# Patient Record
Sex: Female | Born: 1960 | Race: White | Hispanic: Yes | Marital: Married | Smoking: Never smoker
Health system: Southern US, Community
[De-identification: ages and names within clinical notes are randomized; demographics above are authoritative.]

## PROBLEM LIST (undated history)

## (undated) DIAGNOSIS — T4145XA Adverse effect of unspecified anesthetic, initial encounter: Secondary | ICD-10-CM

## (undated) DIAGNOSIS — K439 Ventral hernia without obstruction or gangrene: Secondary | ICD-10-CM

## (undated) DIAGNOSIS — Z6835 Body mass index (BMI) 35.0-35.9, adult: Secondary | ICD-10-CM

## (undated) DIAGNOSIS — R51 Headache: Secondary | ICD-10-CM

## (undated) DIAGNOSIS — I10 Essential (primary) hypertension: Secondary | ICD-10-CM

## (undated) DIAGNOSIS — Z973 Presence of spectacles and contact lenses: Secondary | ICD-10-CM

## (undated) DIAGNOSIS — T8859XA Other complications of anesthesia, initial encounter: Secondary | ICD-10-CM

## (undated) DIAGNOSIS — D45 Polycythemia vera: Secondary | ICD-10-CM

## (undated) DIAGNOSIS — M2041 Other hammer toe(s) (acquired), right foot: Secondary | ICD-10-CM

## (undated) DIAGNOSIS — R519 Headache, unspecified: Secondary | ICD-10-CM

## (undated) DIAGNOSIS — K219 Gastro-esophageal reflux disease without esophagitis: Secondary | ICD-10-CM

## (undated) DIAGNOSIS — Z6836 Body mass index (BMI) 36.0-36.9, adult: Secondary | ICD-10-CM

## (undated) DIAGNOSIS — G4730 Sleep apnea, unspecified: Secondary | ICD-10-CM

## (undated) DIAGNOSIS — J309 Allergic rhinitis, unspecified: Secondary | ICD-10-CM

## (undated) DIAGNOSIS — D75839 Thrombocytosis, unspecified: Secondary | ICD-10-CM

## (undated) DIAGNOSIS — F439 Reaction to severe stress, unspecified: Secondary | ICD-10-CM

## (undated) DIAGNOSIS — M1990 Unspecified osteoarthritis, unspecified site: Secondary | ICD-10-CM

## (undated) DIAGNOSIS — B070 Plantar wart: Secondary | ICD-10-CM

## (undated) DIAGNOSIS — D649 Anemia, unspecified: Secondary | ICD-10-CM

## (undated) HISTORY — DX: Thrombocytosis, unspecified: D75.839

## (undated) HISTORY — DX: Plantar wart: B07.0

## (undated) HISTORY — PX: CHOLECYSTECTOMY: SHX55

## (undated) HISTORY — DX: Morbid (severe) obesity due to excess calories: E66.01

## (undated) HISTORY — DX: Other hammer toe(s) (acquired), right foot: M20.41

## (undated) HISTORY — DX: Body mass index (BMI) 36.0-36.9, adult: Z68.36

## (undated) HISTORY — DX: Polycythemia vera: D45

## (undated) HISTORY — PX: COLONOSCOPY: SHX174

## (undated) HISTORY — DX: Reaction to severe stress, unspecified: F43.9

## (undated) HISTORY — PX: WISDOM TOOTH EXTRACTION: SHX21

## (undated) HISTORY — DX: Allergic rhinitis, unspecified: J30.9

## (undated) HISTORY — DX: Body mass index (BMI) 35.0-35.9, adult: Z68.35

## (undated) HISTORY — PX: HERNIA REPAIR: SHX51

---

## 2017-07-17 ENCOUNTER — Other Ambulatory Visit: Payer: Self-pay | Admitting: Surgery

## 2017-07-17 DIAGNOSIS — K439 Ventral hernia without obstruction or gangrene: Secondary | ICD-10-CM

## 2017-07-20 ENCOUNTER — Other Ambulatory Visit: Payer: Self-pay | Admitting: Obstetrics & Gynecology

## 2017-07-20 DIAGNOSIS — Z139 Encounter for screening, unspecified: Secondary | ICD-10-CM

## 2017-07-26 ENCOUNTER — Ambulatory Visit: Payer: Self-pay

## 2017-08-14 ENCOUNTER — Ambulatory Visit
Admission: RE | Admit: 2017-08-14 | Discharge: 2017-08-14 | Disposition: A | Discharge status: Home / Self Care | Payer: 59 | Source: Ambulatory Visit | Attending: Obstetrics & Gynecology | Admitting: Obstetrics & Gynecology

## 2017-08-14 DIAGNOSIS — Z139 Encounter for screening, unspecified: Secondary | ICD-10-CM

## 2017-08-23 ENCOUNTER — Other Ambulatory Visit: Payer: Self-pay

## 2017-11-03 ENCOUNTER — Ambulatory Visit: Payer: Self-pay | Admitting: Surgery

## 2017-11-03 NOTE — H&P (Signed)
History of Present Illness Connie Myers. Connie Shaker MD; 11/03/2017 4:37 PM) The patient is a 57 year old female who presents with an abdominal wall hernia.   PCP - Connie Myers GYN - Connie Myers  This is a 57 year old female who has had a protruding mass to the right of her umbilicus for at least 18 years. She has had 2 previous C-sections as well as a previous laparoscopic cholecystectomy. Over the last few years she has noticed worsening pain in her right abdominal wall mass. She has had several episodes that are quite severe and have resulted in nausea and vomiting.The mass remains reducible. She has not had any recent imaging. She has lost some weight over the last year. She presents now to discuss surgical options for treatment.   Problem List/Past Medical Connie Myers K. Rivaan Kendall, MD; 11/03/2017 4:37 PM) VENTRAL HERNIA WITHOUT OBSTRUCTION OR GANGRENE (K43.9)  Past Surgical History (Connie Mills K. Yader Criger, MD; 11/03/2017 4:37 PM) Cesarean Section - Multiple Gallbladder Surgery - Laparoscopic Oral Surgery  Diagnostic Studies History Connie Myers. Connie Schoenherr, MD; 11/03/2017 4:37 PM) Colonoscopy 1-5 years ago Pap Smear 1-5 years ago  Allergies Connie Myers, CMA; 11/03/2017 11:07 AM) Codeine Phosphate *ANALGESICS - OPIOID Lidocaine HCl *CHEMICALS* allergic to all meds ending in caine. Allergies Reconciled  Medication History Connie Myers. Connie Mcnulty, MD; 11/03/2017 4:37 PM) Amoxicillin (Oral) Specific strength unknown - Active. Mucinex (Oral) Specific strength unknown - Active. Benzonatate (Oral) Specific strength unknown - Active. Allegra Allergy (Oral) Specific strength unknown - Active. Medications Reconciled No Current Medications  Social History Connie Myers. Connie Warsame, MD; 11/03/2017 4:37 PM) Alcohol use Occasional alcohol use. Caffeine use Coffee, Tea. No drug use Tobacco use Never smoker.  Family History Connie Myers. Connie Jaffee, MD; 11/03/2017 4:37 PM) Alcohol Abuse Brother, Mother. Arthritis  Mother. Breast Cancer Mother. Heart Disease Mother. Migraine Headache Mother. Thyroid problems Mother.  Pregnancy / Birth History Connie Myers. Connie Heydt, MD; 11/03/2017 4:37 PM) Age at menarche 63 years. Age of menopause <43 Gravida 3 Irregular periods Length (months) of breastfeeding 7-12 Maternal age 70-35 Para 2  Other Problems Connie Myers. Connie Singleterry, MD; 11/03/2017 4:37 PM) Gastric Ulcer High blood pressure Migraine Headache Sleep Apnea    Vitals (Connie Myers CMA; 11/03/2017 11:08 AM) 11/03/2017 11:08 AM Weight: 236 lb Height: 61in Body Surface Area: 2.03 m Body Mass Index: 44.59 kg/mA Temp.: 98.70F(Oral)  Pulse: 89 (Regular)  BP: 142/98 (Sitting, Left Arm, Standard)      Physical Exam Connie Myers K. Yosef Krogh MD; 11/03/2017 4:39 PM)  The physical exam findings are as follows: Note:WDWN in NAD Eyes: Pupils equal, round; sclera anicteric HENT: Oral mucosa moist; good dentition Neck: No masses palpated, no thyromegaly Lungs: CTA bilaterally; normal respiratory effort CV: Regular rate and rhythm; no murmurs; extremities well-perfused with no edema Abd: +bowel sounds, soft, non-tender, no palpable organomegaly; 5 cm protruding mass just to the right of umbilicus - reducible; enlarges with Valsalva. No other hernias palpated Skin: Warm, dry; no sign of jaundice Psychiatric - alert and oriented x 4; calm mood and affect    Assessment & Plan Connie Myers K. Anara Cowman MD; 11/03/2017 4:39 PM)  VENTRAL HERNIA WITHOUT OBSTRUCTION OR GANGRENE (K43.9)  Current Plans Schedule for Surgery - open ventral hernia repair with mesh. The surgical procedure has been discussed with the patient.  Potential risks, benefits, alternative treatments, and expected outcomes have been explained. All of the patient's questions at this time have been answered. The likelihood of reaching the patient's treatment goal is good. The patient understand the proposed surgical procedure and  wishes to proceed.  Connie Myers. Georgette Dover, MD, [facility]  General/ Trauma Surgery  11/03/2017 4:43 PM

## 2017-11-30 NOTE — Pre-Procedure Instructions (Signed)
Connie Myers  11/30/2017      [facility], [place] [address] [place] [place] [address] Phone: [phone] Fax: [phone]    Your procedure is scheduled on 12/05/17.  Report to [facility] Admitting at 11 A.M.  Call this number if you have problems the morning of surgery:  [phone]   Remember:  Do not eat or drink after midnight.   Take these medicines the morning of surgery with A SIP OF WATER --allegra    Do not wear jewelry, make-up or nail polish.  Do not wear lotions, powders, or perfumes, or deodorant.  Do not shave 48 hours prior to surgery.  Men may shave face and neck.  Do not bring valuables to the hospital.  [employer] is not responsible for any belongings or valuables.  Contacts, dentures or bridgework may not be worn into surgery.  Leave your suitcase in the car.  After surgery it may be brought to your room.  For patients admitted to the hospital, discharge time will be determined by your treatment team.  Patients discharged the day of surgery will not be allowed to drive home.   Name and phone number of your driver:    Special instructions:  Do not take any aspirin,anti-inflammatories,vitamins,or herbal supplements 5-7 days prior to surgery.Please complete your PRE-SURGERY ENSURE that was given to before you leave your house the morning of surgery.  Please, if able, drink it in one setting. DO NOT SIP.[place] - Preparing for Surgery  Before surgery, you can play an important role.  Because skin is not sterile, your skin needs to be as free of germs as possible.  You can reduce the number of germs on you skin by washing with CHG (chlorahexidine gluconate) soap before surgery.  CHG is an antiseptic cleaner which kills germs and bonds with the skin to continue killing germs even after washing.  Oral Hygiene is also important in reducing the risk of infection.  Remember to brush your  teeth with your regular toothpaste the morning of surgery.  Please DO NOT use if you have an allergy to CHG or antibacterial soaps.  If your skin becomes reddened/irritated stop using the CHG and inform your nurse when you arrive at Short Stay.  Do not shave (including legs and underarms) for at least 48 hours prior to the first CHG shower.  You may shave your face.  Please follow these instructions carefully:   1.  Shower with CHG Soap the night before surgery and the morning of Surgery.  2.  If you choose to wash your hair, wash your hair first as usual with your normal shampoo.  3.  After you shampoo, rinse your hair and body thoroughly to remove the shampoo. 4.  Use CHG as you would any other liquid soap.  You can apply chg directly to the skin and wash gently with a      scrungie or washcloth.           5.  Apply the CHG Soap to your body ONLY FROM THE NECK DOWN.   Do not use on open wounds or open sores. Avoid contact with your eyes, ears, mouth and genitals (private parts).  Wash genitals (private parts) with your normal soap.  6.  Wash thoroughly, paying special attention to the area where your surgery will be performed.  7.  Thoroughly rinse your body with warm water from the neck down.  8.  DO NOT shower/wash with your normal soap after using and rinsing off the CHG Soap.  9.  Pat yourself dry with a clean towel.            10.  Wear clean pajamas.            11.  Place clean sheets on your bed the night of your first shower and do not sleep with pets.  Day of Surgery  Do not apply any lotions/deoderants the morning of surgery.   Please wear clean clothes to the hospital/surgery center. Remember to brush your teeth with toothpaste.    Please read over the following fact sheets that you were given.

## 2017-12-01 ENCOUNTER — Encounter (HOSPITAL_COMMUNITY): Payer: Self-pay

## 2017-12-01 ENCOUNTER — Other Ambulatory Visit: Payer: Self-pay

## 2017-12-01 ENCOUNTER — Encounter (HOSPITAL_COMMUNITY)
Admission: RE | Admit: 2017-12-01 | Discharge: 2017-12-01 | Disposition: A | Discharge status: Home / Self Care | Payer: 59 | Source: Ambulatory Visit | Attending: Surgery | Admitting: Surgery

## 2017-12-01 DIAGNOSIS — Z01812 Encounter for preprocedural laboratory examination: Secondary | ICD-10-CM | POA: Diagnosis present

## 2017-12-01 HISTORY — DX: Ventral hernia without obstruction or gangrene: K43.9

## 2017-12-01 LAB — CBC
HCT: 52.4 % — ABNORMAL HIGH (ref 36.0–46.0)
Hemoglobin: 16.2 g/dL — ABNORMAL HIGH (ref 12.0–15.0)
MCH: 22.2 pg — ABNORMAL LOW (ref 26.0–34.0)
MCHC: 30.9 g/dL (ref 30.0–36.0)
MCV: 71.9 fL — ABNORMAL LOW (ref 78.0–100.0)
Platelets: 713 10*3/uL — ABNORMAL HIGH (ref 150–400)
RBC: 7.29 MIL/uL — ABNORMAL HIGH (ref 3.87–5.11)
RDW: 18.5 % — ABNORMAL HIGH (ref 11.5–15.5)
WBC: 8 10*3/uL (ref 4.0–10.5)

## 2017-12-01 NOTE — Progress Notes (Addendum)
PCP: Dr. Genelle Bal, MD  Cardiologist: pt denies  EKG: pt denies  Stress test: pt denies  ECHO: pt denies  Cardiac Cath: pt denies  Chest x-ray: pt denies past year, no recent respiratory infections/complications

## 2017-12-05 ENCOUNTER — Ambulatory Visit (HOSPITAL_COMMUNITY): Payer: 59 | Admitting: Certified Registered"

## 2017-12-05 ENCOUNTER — Encounter (HOSPITAL_COMMUNITY)
Admission: RE | Disposition: A | Discharge status: Home / Self Care | Payer: Self-pay | Source: Ambulatory Visit | Attending: Surgery

## 2017-12-05 ENCOUNTER — Encounter (HOSPITAL_COMMUNITY): Payer: Self-pay | Admitting: Urology

## 2017-12-05 ENCOUNTER — Observation Stay (HOSPITAL_COMMUNITY)
Admission: RE | Admit: 2017-12-05 | Discharge: 2017-12-07 | Disposition: A | Discharge status: Home / Self Care | Payer: 59 | Source: Ambulatory Visit | Attending: Surgery | Admitting: Surgery

## 2017-12-05 ENCOUNTER — Other Ambulatory Visit: Payer: Self-pay

## 2017-12-05 DIAGNOSIS — Z79899 Other long term (current) drug therapy: Secondary | ICD-10-CM | POA: Insufficient documentation

## 2017-12-05 DIAGNOSIS — Z8261 Family history of arthritis: Secondary | ICD-10-CM | POA: Insufficient documentation

## 2017-12-05 DIAGNOSIS — Z811 Family history of alcohol abuse and dependence: Secondary | ICD-10-CM | POA: Insufficient documentation

## 2017-12-05 DIAGNOSIS — Z885 Allergy status to narcotic agent status: Secondary | ICD-10-CM | POA: Insufficient documentation

## 2017-12-05 DIAGNOSIS — Z9049 Acquired absence of other specified parts of digestive tract: Secondary | ICD-10-CM | POA: Insufficient documentation

## 2017-12-05 DIAGNOSIS — Z803 Family history of malignant neoplasm of breast: Secondary | ICD-10-CM | POA: Insufficient documentation

## 2017-12-05 DIAGNOSIS — Z884 Allergy status to anesthetic agent status: Secondary | ICD-10-CM | POA: Insufficient documentation

## 2017-12-05 DIAGNOSIS — Z9889 Other specified postprocedural states: Secondary | ICD-10-CM

## 2017-12-05 DIAGNOSIS — Z8249 Family history of ischemic heart disease and other diseases of the circulatory system: Secondary | ICD-10-CM | POA: Insufficient documentation

## 2017-12-05 DIAGNOSIS — K439 Ventral hernia without obstruction or gangrene: Secondary | ICD-10-CM | POA: Diagnosis not present

## 2017-12-05 DIAGNOSIS — Z820 Family history of epilepsy and other diseases of the nervous system: Secondary | ICD-10-CM | POA: Insufficient documentation

## 2017-12-05 DIAGNOSIS — E669 Obesity, unspecified: Secondary | ICD-10-CM | POA: Diagnosis not present

## 2017-12-05 DIAGNOSIS — Z7982 Long term (current) use of aspirin: Secondary | ICD-10-CM | POA: Insufficient documentation

## 2017-12-05 DIAGNOSIS — Z8349 Family history of other endocrine, nutritional and metabolic diseases: Secondary | ICD-10-CM | POA: Diagnosis not present

## 2017-12-05 DIAGNOSIS — Z6834 Body mass index (BMI) 34.0-34.9, adult: Secondary | ICD-10-CM | POA: Insufficient documentation

## 2017-12-05 DIAGNOSIS — Z8719 Personal history of other diseases of the digestive system: Secondary | ICD-10-CM

## 2017-12-05 HISTORY — PX: VENTRAL HERNIA REPAIR: SHX424

## 2017-12-05 HISTORY — PX: INSERTION OF MESH: SHX5868

## 2017-12-05 SURGERY — REPAIR, HERNIA, VENTRAL
Anesthesia: General | Site: Abdomen

## 2017-12-05 MED ORDER — DEXAMETHASONE SODIUM PHOSPHATE 10 MG/ML IJ SOLN
INTRAMUSCULAR | Status: DC | PRN
  Administered 2017-12-05: 10 mg via INTRAVENOUS

## 2017-12-05 MED ORDER — BUPIVACAINE-EPINEPHRINE 0.25% -1:200000 IJ SOLN
INTRAMUSCULAR | Status: DC | PRN
  Administered 2017-12-05: 10 mL

## 2017-12-05 MED ORDER — PROPOFOL 10 MG/ML IV BOLUS
INTRAVENOUS | Status: DC | PRN
  Administered 2017-12-05: 140 mg via INTRAVENOUS

## 2017-12-05 MED ORDER — ACETAMINOPHEN 500 MG PO TABS
1000.0000 mg | ORAL_TABLET | ORAL | Status: AC
  Administered 2017-12-05: 1000 mg via ORAL

## 2017-12-05 MED ORDER — CELECOXIB 200 MG PO CAPS
200.0000 mg | ORAL_CAPSULE | ORAL | Status: AC
  Administered 2017-12-05: 200 mg via ORAL

## 2017-12-05 MED ORDER — CELECOXIB 200 MG PO CAPS
ORAL_CAPSULE | ORAL | Status: AC
  Administered 2017-12-05: 200 mg via ORAL
  Filled 2017-12-05: qty 1

## 2017-12-05 MED ORDER — FENTANYL CITRATE (PF) 100 MCG/2ML IJ SOLN
INTRAMUSCULAR | Status: DC | PRN
  Administered 2017-12-05 (×4): 50 ug via INTRAVENOUS

## 2017-12-05 MED ORDER — 0.9 % SODIUM CHLORIDE (POUR BTL) OPTIME
TOPICAL | Status: DC | PRN
  Administered 2017-12-05: 1000 mL

## 2017-12-05 MED ORDER — GABAPENTIN 300 MG PO CAPS
ORAL_CAPSULE | ORAL | Status: AC
  Administered 2017-12-05: 300 mg via ORAL
  Filled 2017-12-05: qty 1

## 2017-12-05 MED ORDER — SUGAMMADEX SODIUM 200 MG/2ML IV SOLN
INTRAVENOUS | Status: AC
  Filled 2017-12-05: qty 2

## 2017-12-05 MED ORDER — KETOROLAC TROMETHAMINE 30 MG/ML IJ SOLN
30.0000 mg | Freq: Once | INTRAMUSCULAR | Status: AC | PRN
  Administered 2017-12-05: 30 mg via INTRAVENOUS

## 2017-12-05 MED ORDER — HYDROMORPHONE HCL 2 MG/ML IJ SOLN
INTRAMUSCULAR | Status: AC
  Administered 2017-12-05: 0.5 mg via INTRAVENOUS
  Filled 2017-12-05: qty 1

## 2017-12-05 MED ORDER — ROCURONIUM BROMIDE 100 MG/10ML IV SOLN
INTRAVENOUS | Status: DC | PRN
  Administered 2017-12-05: 10 mg via INTRAVENOUS
  Administered 2017-12-05: 50 mg via INTRAVENOUS

## 2017-12-05 MED ORDER — ROCURONIUM BROMIDE 50 MG/5ML IV SOLN
INTRAVENOUS | Status: AC
  Filled 2017-12-05: qty 1

## 2017-12-05 MED ORDER — TRAMADOL HCL 50 MG PO TABS
50.0000 mg | ORAL_TABLET | Freq: Four times a day (QID) | ORAL | Status: DC | PRN
  Administered 2017-12-05: 50 mg via ORAL
  Filled 2017-12-05: qty 1

## 2017-12-05 MED ORDER — MIDAZOLAM HCL 2 MG/2ML IJ SOLN
2.0000 mg | Freq: Once | INTRAMUSCULAR | Status: AC
  Administered 2017-12-05: 2 mg via INTRAVENOUS

## 2017-12-05 MED ORDER — LIDOCAINE 2% (20 MG/ML) 5 ML SYRINGE
INTRAMUSCULAR | Status: AC
  Filled 2017-12-05: qty 5

## 2017-12-05 MED ORDER — ACETAMINOPHEN 500 MG PO TABS
ORAL_TABLET | ORAL | Status: AC
  Administered 2017-12-05: 1000 mg via ORAL
  Filled 2017-12-05: qty 2

## 2017-12-05 MED ORDER — KETOROLAC TROMETHAMINE 30 MG/ML IJ SOLN
30.0000 mg | Freq: Four times a day (QID) | INTRAMUSCULAR | Status: DC
  Administered 2017-12-05 – 2017-12-07 (×7): 30 mg via INTRAVENOUS
  Filled 2017-12-05 (×7): qty 1

## 2017-12-05 MED ORDER — ACETAMINOPHEN 325 MG PO TABS: 650.0000 mg | ORAL_TABLET | Freq: Four times a day (QID) | ORAL | Status: DC | PRN

## 2017-12-05 MED ORDER — PHENYLEPHRINE HCL 10 MG/ML IJ SOLN
INTRAMUSCULAR | Status: DC | PRN
  Administered 2017-12-05: 80 ug via INTRAVENOUS

## 2017-12-05 MED ORDER — GABAPENTIN 300 MG PO CAPS
300.0000 mg | ORAL_CAPSULE | ORAL | Status: AC
  Administered 2017-12-05: 300 mg via ORAL

## 2017-12-05 MED ORDER — GLYCOPYRROLATE PF 0.2 MG/ML IJ SOSY
PREFILLED_SYRINGE | INTRAMUSCULAR | Status: AC
  Filled 2017-12-05: qty 1

## 2017-12-05 MED ORDER — MIDAZOLAM HCL 2 MG/2ML IJ SOLN
INTRAMUSCULAR | Status: AC
  Administered 2017-12-05: 2 mg via INTRAVENOUS
  Filled 2017-12-05: qty 2

## 2017-12-05 MED ORDER — KETOROLAC TROMETHAMINE 30 MG/ML IJ SOLN
INTRAMUSCULAR | Status: AC
  Administered 2017-12-05: 30 mg via INTRAVENOUS
  Filled 2017-12-05: qty 1

## 2017-12-05 MED ORDER — SODIUM CHLORIDE 0.9 % IV SOLN
INTRAVENOUS | Status: DC
  Administered 2017-12-05 – 2017-12-06 (×2): via INTRAVENOUS

## 2017-12-05 MED ORDER — SUGAMMADEX SODIUM 200 MG/2ML IV SOLN
INTRAVENOUS | Status: DC | PRN
  Administered 2017-12-05: 200 mg via INTRAVENOUS

## 2017-12-05 MED ORDER — PROMETHAZINE HCL 25 MG/ML IJ SOLN: 6.2500 mg | INTRAMUSCULAR | Status: DC | PRN

## 2017-12-05 MED ORDER — ONDANSETRON 4 MG PO TBDP
4.0000 mg | ORAL_TABLET | Freq: Four times a day (QID) | ORAL | Status: DC | PRN
Start: 2017-12-05 — End: 2017-12-07

## 2017-12-05 MED ORDER — CHLORHEXIDINE GLUCONATE CLOTH 2 % EX PADS: 6.0000 | MEDICATED_PAD | Freq: Once | CUTANEOUS | Status: DC

## 2017-12-05 MED ORDER — ENOXAPARIN SODIUM 40 MG/0.4ML ~~LOC~~ SOLN
40.0000 mg | SUBCUTANEOUS | Status: DC
  Administered 2017-12-06: 40 mg via SUBCUTANEOUS
  Filled 2017-12-05: qty 0.4

## 2017-12-05 MED ORDER — LIDOCAINE HCL (CARDIAC) PF 100 MG/5ML IV SOSY
PREFILLED_SYRINGE | INTRAVENOUS | Status: DC | PRN
  Administered 2017-12-05: 60 mg via INTRAVENOUS

## 2017-12-05 MED ORDER — PHENYLEPHRINE 40 MCG/ML (10ML) SYRINGE FOR IV PUSH (FOR BLOOD PRESSURE SUPPORT)
PREFILLED_SYRINGE | INTRAVENOUS | Status: AC
  Filled 2017-12-05: qty 10

## 2017-12-05 MED ORDER — LACTATED RINGERS IV SOLN
INTRAVENOUS | Status: DC
  Administered 2017-12-05 (×2): via INTRAVENOUS

## 2017-12-05 MED ORDER — ONDANSETRON HCL 4 MG/2ML IJ SOLN: 4.0000 mg | Freq: Four times a day (QID) | INTRAMUSCULAR | Status: DC | PRN

## 2017-12-05 MED ORDER — OXYCODONE HCL 5 MG PO TABS
5.0000 mg | ORAL_TABLET | ORAL | Status: DC | PRN
  Administered 2017-12-05: 10 mg via ORAL
  Administered 2017-12-06: 5 mg via ORAL
  Administered 2017-12-06 – 2017-12-07 (×2): 10 mg via ORAL
  Filled 2017-12-05 (×3): qty 2
  Filled 2017-12-05: qty 1

## 2017-12-05 MED ORDER — MIDAZOLAM HCL 5 MG/5ML IJ SOLN
INTRAMUSCULAR | Status: DC | PRN
  Administered 2017-12-05: 2 mg via INTRAVENOUS

## 2017-12-05 MED ORDER — DIPHENHYDRAMINE HCL 50 MG/ML IJ SOLN: 25.0000 mg | Freq: Four times a day (QID) | INTRAMUSCULAR | Status: DC | PRN

## 2017-12-05 MED ORDER — ROPIVACAINE HCL 5 MG/ML IJ SOLN
INTRAMUSCULAR | Status: DC | PRN
  Administered 2017-12-05: 30 mL

## 2017-12-05 MED ORDER — CEFAZOLIN SODIUM-DEXTROSE 2-4 GM/100ML-% IV SOLN
2.0000 g | INTRAVENOUS | Status: AC
  Administered 2017-12-05: 2 g via INTRAVENOUS

## 2017-12-05 MED ORDER — PROPOFOL 10 MG/ML IV BOLUS
INTRAVENOUS | Status: AC
  Filled 2017-12-05: qty 20

## 2017-12-05 MED ORDER — CEFAZOLIN SODIUM-DEXTROSE 2-4 GM/100ML-% IV SOLN
INTRAVENOUS | Status: AC
  Filled 2017-12-05: qty 100

## 2017-12-05 MED ORDER — ACETAMINOPHEN 650 MG RE SUPP: 650.0000 mg | Freq: Four times a day (QID) | RECTAL | Status: DC | PRN

## 2017-12-05 MED ORDER — FENTANYL CITRATE (PF) 100 MCG/2ML IJ SOLN
INTRAMUSCULAR | Status: AC
  Administered 2017-12-05: 50 ug via INTRAVENOUS
  Filled 2017-12-05: qty 2

## 2017-12-05 MED ORDER — ONDANSETRON HCL 4 MG/2ML IJ SOLN
INTRAMUSCULAR | Status: AC
  Filled 2017-12-05: qty 2

## 2017-12-05 MED ORDER — FENTANYL CITRATE (PF) 100 MCG/2ML IJ SOLN
50.0000 ug | Freq: Once | INTRAMUSCULAR | Status: AC
  Administered 2017-12-05: 50 ug via INTRAVENOUS

## 2017-12-05 MED ORDER — BUPIVACAINE-EPINEPHRINE (PF) 0.25% -1:200000 IJ SOLN
INTRAMUSCULAR | Status: AC
  Filled 2017-12-05: qty 30

## 2017-12-05 MED ORDER — DIPHENHYDRAMINE HCL 25 MG PO CAPS
25.0000 mg | ORAL_CAPSULE | Freq: Four times a day (QID) | ORAL | Status: DC | PRN
  Administered 2017-12-06: 25 mg via ORAL
  Filled 2017-12-05: qty 1

## 2017-12-05 MED ORDER — MIDAZOLAM HCL 2 MG/2ML IJ SOLN
INTRAMUSCULAR | Status: AC
Start: 2017-12-05 — End: ?
  Filled 2017-12-05: qty 2

## 2017-12-05 MED ORDER — FENTANYL CITRATE (PF) 250 MCG/5ML IJ SOLN
INTRAMUSCULAR | Status: AC
  Filled 2017-12-05: qty 5

## 2017-12-05 MED ORDER — MORPHINE SULFATE (PF) 2 MG/ML IV SOLN: 2.0000 mg | INTRAVENOUS | Status: DC | PRN

## 2017-12-05 MED ORDER — HYDROMORPHONE HCL 2 MG/ML IJ SOLN
0.2500 mg | INTRAMUSCULAR | Status: DC | PRN
  Administered 2017-12-05 (×3): 0.5 mg via INTRAVENOUS

## 2017-12-05 MED ORDER — ONDANSETRON HCL 4 MG/2ML IJ SOLN
INTRAMUSCULAR | Status: DC | PRN
  Administered 2017-12-05: 4 mg via INTRAVENOUS

## 2017-12-05 MED ORDER — GLYCOPYRROLATE 0.2 MG/ML IJ SOLN
INTRAMUSCULAR | Status: DC | PRN
  Administered 2017-12-05 (×2): 0.1 mg via INTRAVENOUS

## 2017-12-05 SURGICAL SUPPLY — 34 items
BENZOIN TINCTURE PRP APPL 2/3 (GAUZE/BANDAGES/DRESSINGS) ×3 IMPLANT
CANISTER SUCT 3000ML PPV (MISCELLANEOUS) ×3 IMPLANT
CHLORAPREP W/TINT 26ML (MISCELLANEOUS) ×3 IMPLANT
COVER SURGICAL LIGHT HANDLE (MISCELLANEOUS) ×3 IMPLANT
DRAPE LAPAROSCOPIC ABDOMINAL (DRAPES) ×3 IMPLANT
DRSG TEGADERM 4X4.75 (GAUZE/BANDAGES/DRESSINGS) ×3 IMPLANT
ELECT BLADE 4.0 EZ CLEAN MEGAD (MISCELLANEOUS) ×3
ELECT CAUTERY BLADE 6.4 (BLADE) ×3 IMPLANT
ELECT REM PT RETURN 9FT ADLT (ELECTROSURGICAL) ×3
ELECTRODE BLDE 4.0 EZ CLN MEGD (MISCELLANEOUS) ×2 IMPLANT
ELECTRODE REM PT RTRN 9FT ADLT (ELECTROSURGICAL) ×2 IMPLANT
GAUZE SPONGE 4X4 12PLY STRL (GAUZE/BANDAGES/DRESSINGS) ×3 IMPLANT
GAUZE SPONGE 4X4 12PLY STRL LF (GAUZE/BANDAGES/DRESSINGS) ×3 IMPLANT
GLOVE BIO SURGEON STRL SZ7 (GLOVE) ×6 IMPLANT
GLOVE BIOGEL PI IND STRL 7.5 (GLOVE) ×2 IMPLANT
GLOVE BIOGEL PI INDICATOR 7.5 (GLOVE) ×1
GOWN STRL REUS W/ TWL LRG LVL3 (GOWN DISPOSABLE) ×4 IMPLANT
GOWN STRL REUS W/TWL LRG LVL3 (GOWN DISPOSABLE) ×2
KIT BASIN OR (CUSTOM PROCEDURE TRAY) ×3 IMPLANT
KIT TURNOVER KIT B (KITS) ×3 IMPLANT
MESH VENT ST 11.4CM L CIR (Mesh General) ×3 IMPLANT
NEEDLE HYPO 25GX1X1/2 BEV (NEEDLE) ×3 IMPLANT
NS IRRIG 1000ML POUR BTL (IV SOLUTION) ×3 IMPLANT
PACK GENERAL/GYN (CUSTOM PROCEDURE TRAY) ×3 IMPLANT
PAD ARMBOARD 7.5X6 YLW CONV (MISCELLANEOUS) ×6 IMPLANT
STRIP CLOSURE SKIN 1/2X4 (GAUZE/BANDAGES/DRESSINGS) ×3 IMPLANT
SUT MNCRL AB 4-0 PS2 18 (SUTURE) ×3 IMPLANT
SUT NOVA NAB GS-21 0 18 T12 DT (SUTURE) ×9 IMPLANT
SUT NOVA NAB GS-21 1 T12 (SUTURE) ×9 IMPLANT
SUT VIC AB 3-0 SH 27 (SUTURE) ×1
SUT VIC AB 3-0 SH 27XBRD (SUTURE) ×2 IMPLANT
SYR CONTROL 10ML LL (SYRINGE) ×3 IMPLANT
TOWEL OR 17X24 6PK STRL BLUE (TOWEL DISPOSABLE) ×3 IMPLANT
TOWEL OR 17X26 10 PK STRL BLUE (TOWEL DISPOSABLE) ×3 IMPLANT

## 2017-12-05 NOTE — Op Note (Signed)
Ventral Hernia Repair Procedure Note  Indications: This is a 57 year old female who has had a protruding mass to the right of her umbilicus for at least 18 years. She has had 2 previous C-sections as well as a previous laparoscopic cholecystectomy. Over the last few years she has noticed worsening pain in her right abdominal wall mass. She has had several episodes that are quite severe and have resulted in nausea and vomiting.The mass remains reducible. She has not had any recent imaging. She has lost some weight over the last year. She presents now to discuss surgical options for treatment.  Pre-operative Diagnosis: Ventral hernia  Post-operative Diagnosis: Ventral hernia  Surgeon: Maia Petties   Assistants: Arrie Aran RNFA  Anesthesia: General endotracheal anesthesia  ASA Class: 2  Procedure Details  The patient was seen in the Holding Room. The risks, benefits, complications, treatment options, and expected outcomes were discussed with the patient. The possibilities of reaction to medication, pulmonary aspiration, perforation of viscus, bleeding, recurrent infection, the need for additional procedures, failure to diagnose a condition, and creating a complication requiring transfusion or operation were discussed with the patient. The patient concurred with the proposed plan, giving informed consent.  The site of surgery properly noted/marked. The patient was taken to the operating room, identified as Connie Myers and the procedure verified as ventral hernia repair. A Time Out was held and the above information confirmed.  The patient was placed supine.  After establishing general anesthesia, the abdomen was prepped with Chloraprep and draped in sterile fashion.  We made a vertical incision in the midline around the umbilicus.  The hernia sac seems to protrude to the right side.   Dissection was carried down to the hernia sac located above the fascia and mobilized from  surrounding structures.  Intact fascia was identified circumferentially around the defect.  I excised the large hernia sac and reduced the incarcerated omentum.  The defect measured 3 x 5 cm.  We used a 11.4 cm Ventrio mesh and secured this to the fascia with interrupted 0 Novofil sutures.  The fascial defect was reapproximated with interrupted figure-of-8 1 Novofil sutures.  The subcutaneous tissues were irrigated.  Hemostasis was good.  We closed with a deep layer of 3-0 Vicryl and a subcuticular layer of 4-0 Monocryl.   Steri-Strips were applied at the end of the operation.    Instrument, sponge, and needle counts were correct prior to closure and at the conclusion of the case.   Findings: 3 x 5 cm midline defect with incarcerated omentum  Estimated Blood Loss:  less than 50 mL         Drains: none                      Complications:  None; patient tolerated the procedure well.         Disposition: PACU - hemodynamically stable.         Condition: stable  [name]. Georgette Dover, MD, [facility]  General/ Trauma Surgery  12/05/2017 2:48 PM

## 2017-12-05 NOTE — Anesthesia Procedure Notes (Signed)
Anesthesia Procedure Image    

## 2017-12-05 NOTE — Transfer of Care (Signed)
Immediate Anesthesia Transfer of Care Note  Patient: Connie Myers  Procedure(s) Performed: OPEN VENTRAL HERNIA REPAIR ERAS PATHWAY (N/A Abdomen) INSERTION OF MESH (N/A )  Patient Location: PACU  Anesthesia Type:GA combined with regional for post-op pain  Level of Consciousness: awake  Airway & Oxygen Therapy: Patient Spontanous Breathing and Patient connected to face mask oxygen  Post-op Assessment: Report given to RN, Post -op Vital signs reviewed and stable and Patient moving all extremities X 4  Post vital signs: Reviewed and stable  Last Vitals:  Vitals Value Taken Time  BP 138/81 12/05/2017  2:54 PM  Temp    Pulse 62 12/05/2017  2:58 PM  Resp 16 12/05/2017  2:58 PM  SpO2 100 % 12/05/2017  2:58 PM  Vitals shown include unvalidated device data.  Last Pain:  Vitals:   12/05/17 1146  TempSrc:   PainSc: 6          Complications: No apparent anesthesia complications

## 2017-12-05 NOTE — H&P (Signed)
History of Present Illness  The patient is a 57 year old female who presents with an abdominal wall hernia.   PCP - Sheryn Bison GYN - Nelda Marseille  This is a 57 year old female who has had a protruding mass to the right of her umbilicus for at least 18 years. She has had 2 previous C-sections as well as a previous laparoscopic cholecystectomy. Over the last few years she has noticed worsening pain in her right abdominal wall mass. She has had several episodes that are quite severe and have resulted in nausea and vomiting.The mass remains reducible. She has not had any recent imaging. She has lost some weight over the last year. She presents now to discuss surgical options for treatment.   Problem List/Past Medical  VENTRAL HERNIA WITHOUT OBSTRUCTION OR GANGRENE (K43.9)  Past Surgical History  Cesarean Section - Multiple Gallbladder Surgery - Laparoscopic Oral Surgery  Diagnostic Studies History  Colonoscopy 1-5 years ago Pap Smear 1-5 years ago  Allergies  Codeine Phosphate *ANALGESICS - OPIOID Lidocaine HCl *CHEMICALS* allergic to all meds ending in caine. Allergies Reconciled  Medication History  Amoxicillin (Oral) Specific strength unknown - Active. Mucinex (Oral) Specific strength unknown - Active. Benzonatate (Oral) Specific strength unknown - Active. Allegra Allergy (Oral) Specific strength unknown - Active. Medications Reconciled No Current Medications  Social History  Alcohol use Occasional alcohol use. Caffeine use Coffee, Tea. No drug use Tobacco use Never smoker.  Family History  Alcohol Abuse Brother, Mother. Arthritis Mother. Breast Cancer Mother. Heart Disease Mother. Migraine Headache Mother. Thyroid problems Mother.  Pregnancy / Birth History  Age at menarche 35 years. Age of menopause <43 Gravida 3 Irregular periods Length (months) of breastfeeding 7-12 Maternal age 83-35 Para 2  Other Problems  Gastric  Ulcer High blood pressure Migraine Headache Sleep Apnea    Vitals  Weight: 236 lb Height: 61in Body Surface Area: 2.03 m Body Mass Index: 44.59 kg/mA Temp.: 98.65F(Oral)  Pulse: 89 (Regular)  BP: 142/98 (Sitting, Left Arm, Standard)      Physical Exam  The physical exam findings are as follows: Note:WDWN in NAD Eyes: Pupils equal, round; sclera anicteric HENT: Oral mucosa moist; good dentition Neck: No masses palpated, no thyromegaly Lungs: CTA bilaterally; normal respiratory effort CV: Regular rate and rhythm; no murmurs; extremities well-perfused with no edema Abd: +bowel sounds, soft, non-tender, no palpable organomegaly; 5 cm protruding mass just to the right of umbilicus - reducible; enlarges with Valsalva. No other hernias palpated Skin: Warm, dry; no sign of jaundice Psychiatric - alert and oriented x 4; calm mood and affect    Assessment & Plan  VENTRAL HERNIA WITHOUT OBSTRUCTION OR GANGRENE (K43.9)  Current Plans Schedule for Surgery - open ventral hernia repair with mesh. The surgical procedure has been discussed with the patient.  Potential risks, benefits, alternative treatments, and expected outcomes have been explained. All of the patient's questions at this time have been answered. The likelihood of reaching the patient's treatment goal is good. The patient understand the proposed surgical procedure and wishes to proceed.   [name]. Georgette Dover, MD, [facility]  General/ Trauma Surgery  12/05/2017 1:01 PM

## 2017-12-05 NOTE — Anesthesia Postprocedure Evaluation (Signed)
Anesthesia Post Note  Patient: Connie Myers  Procedure(s) Performed: OPEN VENTRAL HERNIA REPAIR ERAS PATHWAY (N/A Abdomen) INSERTION OF MESH (N/A )     Patient location during evaluation: PACU Anesthesia Type: General Level of consciousness: awake and alert Pain management: pain level controlled Vital Signs Assessment: post-procedure vital signs reviewed and stable Respiratory status: spontaneous breathing, nonlabored ventilation, respiratory function stable and patient connected to nasal cannula oxygen Cardiovascular status: blood pressure returned to baseline and stable Postop Assessment: no apparent nausea or vomiting Anesthetic complications: no    Last Vitals:  Vitals:   12/05/17 1510 12/05/17 1525  BP: (!) 116/100 (!) 145/86  Pulse: (!) 55 61  Resp: 11 14  Temp:    SpO2: 100% 97%    Last Pain:  Vitals:   12/05/17 1525  TempSrc:   PainSc: 7                  Lariya Kinzie S

## 2017-12-05 NOTE — Anesthesia Procedure Notes (Signed)
Anesthesia Regional Block: TAP block   Pre-Anesthetic Checklist: ,, timeout performed, Correct Patient, Correct Site, Correct Laterality, Correct Procedure, Correct Position, site marked, Risks and benefits discussed,  Surgical consent,  Pre-op evaluation,  At surgeon's request and post-op pain management  Laterality: Right  Prep: chloraprep       Needles:  Injection technique: Single-shot  Needle Type: Echogenic Needle     Needle Length: 9cm      Additional Needles:   Procedures:,,,, ultrasound used (permanent image in chart),,,,  Narrative:  Start time: 12/05/2017 12:40 PM End time: 12/05/2017 12:52 PM Injection made incrementally with aspirations every 5 mL.  Performed by: Personally  Anesthesiologist: Myrtie Soman, MD  Additional Notes: Patient tolerated the procedure well without complications

## 2017-12-05 NOTE — Anesthesia Preprocedure Evaluation (Signed)
Anesthesia Evaluation  Patient identified by MRN, date of birth, ID band Patient awake    Reviewed: Allergy & Precautions, NPO status , Patient's Chart, lab work & pertinent test results  Airway Mallampati: II  TM Distance: >3 FB Neck ROM: Full    Dental no notable dental hx.    Pulmonary neg pulmonary ROS,    Pulmonary exam normal breath sounds clear to auscultation       Cardiovascular negative cardio ROS Normal cardiovascular exam Rhythm:Regular Rate:Normal     Neuro/Psych negative neurological ROS  negative psych ROS   GI/Hepatic negative GI ROS, Neg liver ROS,   Endo/Other  obesity  Renal/GU negative Renal ROS  negative genitourinary   Musculoskeletal negative musculoskeletal ROS (+)   Abdominal   Peds negative pediatric ROS (+)  Hematology negative hematology ROS (+)   Anesthesia Other Findings   Reproductive/Obstetrics negative OB ROS                             Anesthesia Physical Anesthesia Plan  ASA: II  Anesthesia Plan: General   Post-op Pain Management:    Induction: Intravenous  PONV Risk Score and Plan: 3 and Ondansetron, Dexamethasone, Midazolam and Treatment may vary due to age or medical condition  Airway Management Planned: Oral ETT  Additional Equipment:   Intra-op Plan:   Post-operative Plan: Extubation in OR  Informed Consent: I have reviewed the patients History and Physical, chart, labs and discussed the procedure including the risks, benefits and alternatives for the proposed anesthesia with the patient or authorized representative who has indicated his/her understanding and acceptance.   Dental advisory given  Plan Discussed with: CRNA and Surgeon  Anesthesia Plan Comments:         Anesthesia Quick Evaluation

## 2017-12-05 NOTE — Anesthesia Procedure Notes (Signed)
Procedure Name: Intubation Date/Time: 12/05/2017 1:25 PM Performed by: Inda Coke, CRNA Pre-anesthesia Checklist: Patient identified, Emergency Drugs available, Suction available and Patient being monitored Patient Re-evaluated:Patient Re-evaluated prior to induction Oxygen Delivery Method: Circle System Utilized Preoxygenation: Pre-oxygenation with 100% oxygen Induction Type: IV induction Ventilation: Mask ventilation without difficulty and Oral airway inserted - appropriate to patient size Laryngoscope Size: Mac and 3 Grade View: Grade II Tube type: Oral Tube size: 7.0 mm Number of attempts: 1 Airway Equipment and Method: Stylet and Oral airway Placement Confirmation: ETT inserted through vocal cords under direct vision,  positive ETCO2 and breath sounds checked- equal and bilateral Secured at: 22 cm Tube secured with: Tape Dental Injury: Teeth and Oropharynx as per pre-operative assessment  Comments: Performed by Julianne Rice, SRNA

## 2017-12-06 ENCOUNTER — Encounter (HOSPITAL_COMMUNITY): Payer: Self-pay | Admitting: General Practice

## 2017-12-06 DIAGNOSIS — K439 Ventral hernia without obstruction or gangrene: Secondary | ICD-10-CM | POA: Diagnosis not present

## 2017-12-06 LAB — CBC
HCT: 46.3 % — ABNORMAL HIGH (ref 36.0–46.0)
Hemoglobin: 14.2 g/dL (ref 12.0–15.0)
MCH: 22.2 pg — ABNORMAL LOW (ref 26.0–34.0)
MCHC: 30.7 g/dL (ref 30.0–36.0)
MCV: 72.3 fL — ABNORMAL LOW (ref 78.0–100.0)
Platelets: 655 10*3/uL — ABNORMAL HIGH (ref 150–400)
RBC: 6.4 MIL/uL — ABNORMAL HIGH (ref 3.87–5.11)
RDW: 17.8 % — ABNORMAL HIGH (ref 11.5–15.5)
WBC: 14.5 10*3/uL — ABNORMAL HIGH (ref 4.0–10.5)

## 2017-12-06 LAB — BASIC METABOLIC PANEL
Anion gap: 5 (ref 5–15)
BUN: 12 mg/dL (ref 6–20)
CO2: 24 mmol/L (ref 22–32)
Calcium: 7.9 mg/dL — ABNORMAL LOW (ref 8.9–10.3)
Chloride: 105 mmol/L (ref 101–111)
Creatinine, Ser: 0.53 mg/dL (ref 0.44–1.00)
GFR calc Af Amer: >60 mL/min (ref >60)
GFR calc non Af Amer: >60 mL/min (ref >60)
Glucose, Bld: 123 mg/dL — ABNORMAL HIGH (ref 65–99)
Potassium: 4.4 mmol/L (ref 3.5–5.1)
Sodium: 134 mmol/L — ABNORMAL LOW (ref 135–145)

## 2017-12-06 NOTE — Progress Notes (Signed)
1 Day Post-Op   Subjective/Chief Complaint: Patient quite sore Tolerating diet - no nausea Voiding Using Toradol/ Oxycodone  Loves her abdominal binder   Objective: Vital signs in last 24 hours: Temp:  [97.5 F (36.4 C)-98.6 F (37 C)] 97.8 F (36.6 C) (06/19 0530) Pulse Rate:  [55-82] 78 (06/19 0530) Resp:  [11-24] 18 (06/19 0530) BP: (116-193)/(72-115) 145/80 (06/19 0530) SpO2:  [87 %-100 %] 93 % (06/19 0530) Weight:  [109.3 kg (241 lb)] 109.3 kg (241 lb) (06/18 1104) Last BM Date: 12/05/17  Intake/Output from previous day: 06/18 0701 - 06/19 0700 In: 2006.1 [P.O.:300; I.V.:1706.1] Out: 10 [Blood:10] Intake/Output this shift: No intake/output data recorded.  General appearance: alert, cooperative and no distress GI: soft, tender around incision; slight erythema over old hernia sac to the right of midline Dressing with some drainage - removed original dressing; no active drainage  Lab Results:  Recent Labs    12/06/17 0612  WBC 14.5*  HGB 14.2  HCT 46.3*  PLT 655*   BMET Recent Labs    12/06/17 0612  NA 134*  K 4.4  CL 105  CO2 24  GLUCOSE 123*  BUN 12  CREATININE 0.53  CALCIUM 7.9*   PT/INR No results for input(s): LABPROT, INR in the last 72 hours. ABG No results for input(s): PHART, HCO3 in the last 72 hours.  Invalid input(s): PCO2, PO2  Studies/Results: No results found.  Anti-infectives: Anti-infectives (From admission, onward)   Start     Dose/Rate Route Frequency Ordered Stop   12/05/17 1200  ceFAZolin (ANCEF) IVPB 2g/100 mL premix     2 g 200 mL/hr over 30 Minutes Intravenous On call to O.R. 12/05/17 1055 12/05/17 1328   12/05/17 1036  ceFAZolin (ANCEF) 2-4 GM/100ML-% IVPB    Note to Pharmacy:  Debbe Bales, Meredit: cabinet override      12/05/17 1036 12/05/17 1328      Assessment/Plan: s/p Procedure(s): OPEN VENTRAL HERNIA REPAIR ERAS PATHWAY (N/A) INSERTION OF MESH (N/A) Encourage ambulation Continue toradol Possible  discharge tomorrow.  LOS: 0 days    Maia Petties 12/06/2017

## 2017-12-07 ENCOUNTER — Encounter (HOSPITAL_COMMUNITY): Payer: Self-pay | Admitting: Surgery

## 2017-12-07 DIAGNOSIS — K439 Ventral hernia without obstruction or gangrene: Secondary | ICD-10-CM | POA: Diagnosis not present

## 2017-12-07 MED ORDER — OXYCODONE HCL 5 MG PO TABS: 5.0000 mg | ORAL_TABLET | Freq: Four times a day (QID) | ORAL | 0 refills | Status: DC | PRN

## 2017-12-07 NOTE — Discharge Summary (Signed)
Physician Discharge Summary  Patient ID: Connie Myers MRN: [id number] DOB/AGE: 1960/11/03 57 y.o.  Admit date: 12/05/2017 Discharge date: 12/07/2017  Admission Diagnoses:Ventral hernia  Discharge Diagnoses: same Active Problems:   S/P repair of ventral hernia   Discharged Condition: good  Hospital Course: Open repair of ventral hernia on 12/05/17.  Mesh repair with 11 cm Ventrio mesh.  Pain control issues on POD #1.  Ready for discharge today.    Treatments: surgery: as above  Discharge Exam: Blood pressure 139/84, pulse 74, temperature 98.6 F (37 C), temperature source Oral, resp. rate 19, height 5\' 10"  (1.778 m), weight 109.3 kg (241 lb), SpO2 93 %. General appearance: alert, cooperative and no distress Cardio: regular rate and rhythm, S1, S2 normal, no murmur, click, rub or gallop GI: soft, incisional tenderness, bruising around incision Incision - steri-strips intact; minimal old bloody drainage from upper end of incision  Disposition: Discharge disposition: 01-Home or Self Care       Discharge Instructions    Call MD for:  persistant nausea and vomiting   Complete by:  As directed    Call MD for:  redness, tenderness, or signs of infection (pain, swelling, redness, odor or green/yellow discharge around incision site)   Complete by:  As directed    Call MD for:  severe uncontrolled pain   Complete by:  As directed    Call MD for:  temperature >100.4   Complete by:  As directed    Diet general   Complete by:  As directed    Driving Restrictions   Complete by:  As directed    Do not drive while taking pain medications   Increase activity slowly   Complete by:  As directed    May shower / Bathe   Complete by:  As directed      Allergies as of 12/07/2017      Reactions   Codeine Other (See Comments)   GI Upset   Local [lidocaine]    Local Anesthesia does not work for her      Medication List    TAKE these medications   aspirin 81 MG chewable  tablet Chew by mouth daily.   diphenhydrAMINE 25 MG tablet Commonly known as:  BENADRYL Take 25 mg by mouth every 6 (six) hours as needed.   fexofenadine 180 MG tablet Commonly known as:  ALLEGRA Take 180 mg by mouth daily as needed for allergies.   ibuprofen 200 MG tablet Commonly known as:  ADVIL,MOTRIN Take 600 mg by mouth every 8 (eight) hours as needed for moderate pain.   oxyCODONE 5 MG immediate release tablet Commonly known as:  Oxy IR/ROXICODONE Take 1 tablet (5 mg total) by mouth every 6 (six) hours as needed for moderate pain.      Follow-up Information    Donnie Mesa, MD. Schedule an appointment as soon as possible for a visit in 3 week(s).   Specialty:  General Surgery Contact information: [address] [place] [address] [phone]           Signed: Maia Petties 12/07/2017, 7:46 AM

## 2017-12-07 NOTE — Progress Notes (Signed)
[name] to be D/C'd  per MD order. Discussed with the patient and all questions fully answered.  VSS, Skin clean, dry and intact without evidence of skin break down, no evidence of skin tears noted.  IV catheter discontinued intact. Site without signs and symptoms of complications. Dressing and pressure applied.  An After Visit Summary was printed and given to the patient. Patient received prescription.  D/c education completed with patient/family including follow up instructions, medication list, d/c activities limitations if indicated, with other d/c instructions as indicated by MD - patient able to verbalize understanding, all questions fully answered.   Patient instructed to return to ED, call 911, or call MD for any changes in condition.   Patient to be escorted via [place], and D/C home via private auto.

## 2017-12-07 NOTE — Discharge Instructions (Signed)
[facility], [place] [phone]  OPEN ABDOMINAL SURGERY: POST OP INSTRUCTIONS  Always review your discharge instruction sheet given to you by the facility where your surgery was performed.  IF YOU HAVE DISABILITY OR FAMILY LEAVE FORMS, YOU MUST BRING THEM TO THE OFFICE FOR PROCESSING.  PLEASE DO NOT GIVE THEM TO YOUR DOCTOR.  1. A prescription for pain medication may be given to you upon discharge.  Take your pain medication as prescribed, if needed.  If narcotic pain medicine is not needed, then you may take acetaminophen (Tylenol) or ibuprofen (Advil) as needed. 2. Take your usually prescribed medications unless otherwise directed. 3. If you need a refill on your pain medication, please contact your pharmacy. They will contact our office to request authorization.  Prescriptions will not be filled after 5pm or on week-ends. 4. You should follow a light diet the first few days after arrival home, such as soup and crackers, pudding, etc.unless your doctor has advised otherwise. A high-fiber, low fat diet can be resumed as tolerated.   Be sure to include lots of fluids daily. Most patients will experience some swelling and bruising on the chest and neck area.  Ice packs will help.  Swelling and bruising can take several days to resolve 5. Most patients will experience some swelling and bruising in the area of the incision. Ice pack will help. Swelling and bruising can take several days to resolve..  6. It is common to experience some constipation if taking pain medication after surgery.  Increasing fluid intake and taking a stool softener will usually help or prevent this problem from occurring.  A mild laxative (Milk of Magnesia or Miralax) should be taken according to package directions if there are no bowel movements after 48 hours. 7.  You may have steri-strips (small skin tapes) in place directly over the incision.  These strips should be left on the skin for 7-10 days.   You  should palce a light gauze bandage over your incision.. You may shower and replace the bandage daily. 8. ACTIVITIES:  You may resume regular (light) daily activities beginning the next day--such as daily self-care, walking, climbing stairs--gradually increasing activities as tolerated.  You may have sexual intercourse when it is comfortable.  Refrain from any heavy lifting or straining until approved by your doctor. a. You may drive when you no longer are taking prescription pain medication, you can comfortably wear a seatbelt, and you can safely maneuver your car and apply brakes b. Return to Work: ___________________________________ 74. You should see your doctor in the office for a follow-up appointment approximately two weeks after your surgery.  Make sure that you call for this appointment within a day or two after you arrive home to insure a convenient appointment time. OTHER INSTRUCTIONS:  _____________________________________________________________ _____________________________________________________________  WHEN TO CALL YOUR DOCTOR: 1. Fever over 101.0 2. Inability to urinate 3. Nausea and/or vomiting 4. Extreme swelling or bruising 5. Continued bleeding from incision. 6. Increased pain, redness, or drainage from the incision. 7. Difficulty swallowing or breathing 8. Muscle cramping or spasms. 9. Numbness or tingling in hands or feet or around lips.  The clinic staff is available to answer your questions during regular business hours.  Please dont hesitate to call and ask to speak to one of the nurses if you have concerns.  For further questions, please visit [email or web address]

## 2017-12-17 ENCOUNTER — Telehealth: Payer: Self-pay | Admitting: General Surgery

## 2017-12-17 NOTE — Telephone Encounter (Signed)
Pt called concerned about minor bleeding from her incision that has continued for 3 days.  She is changing her bandage ~2-3 times a day.  She is keeping her binder in place and keeping her incision covered.  It is not draining any purulence.  I have recommended that she be evaluated in urgent office Mon for wound check.

## 2017-12-28 ENCOUNTER — Other Ambulatory Visit: Payer: Self-pay

## 2017-12-28 ENCOUNTER — Encounter (HOSPITAL_COMMUNITY): Payer: Self-pay | Admitting: *Deleted

## 2017-12-28 ENCOUNTER — Ambulatory Visit: Payer: Self-pay | Admitting: Surgery

## 2017-12-28 NOTE — Progress Notes (Signed)
Pt denies SOB, chest pain, and being under the care of a cardiologist. Pt denies having a stress test, echo and cardiac cath. Pt denies having an EKG and chest x ray within the last year. Pt denies recent labs. Pt made aware to stop taking  Aspirin, vitamins, fish oil, and herbal medications. Do not take any NSAIDs ie: Ibuprofen, Advil, Naproxen (Aleve), Motrin, BC and Goody Powder. Pt verbalized understanding of all pre-op instructions.

## 2017-12-28 NOTE — H&P (View-Only) (Signed)
History of Present Illness  The patient is a 57 year old female who presents after an abdominal wall hernia repair. PCP - Sheryn Bison GYN - Nelda Marseille  This is a 57 year old female who has had a protruding mass to the right of her umbilicus for at least 18 years. She has had 2 previous C-sections as well as a previous laparoscopic cholecystectomy. Over the last few years she has noticed worsening pain in her right abdominal wall mass. She has had several episodes that are quite severe and have resulted in nausea and vomiting.The mass remains reducible. She has not had any recent imaging. She has lost some weight over the last year. She underwent ventral hernia repair with mesh on 12/05/17 with an 11.4 cm Ventrio mesh.  She was doing well until she developed some oozing from her incision.  We opened the center 2 cm of the incision and the patient has had persistent bleeding from this area.  We attempted to treat this area with packing and Surgicel, but it continues to ooze.  The area does not look infected and there has been no purulent drainage.  She presents now for wound exploration to find the source of bleeding.  Problem List/Past Medical  VENTRAL HERNIA WITHOUT OBSTRUCTION OR GANGRENE (K43.9)  Past Surgical History  Cesarean Section - Multiple Gallbladder Surgery - Laparoscopic Oral Surgery  Diagnostic Studies History  Colonoscopy 1-5 years ago Pap Smear 1-5 years ago  Allergies  Codeine Phosphate *ANALGESICS - OPIOID Lidocaine HCl *CHEMICALS* allergic to all meds ending in caine. Allergies Reconciled  Medication History  Amoxicillin(Oral) Specific strength unknown - Active. Mucinex (Oral) Specific strength unknown - Active. Benzonatate (Oral) Specific strength unknown - Active. Allegra Allergy (Oral) Specific strength unknown - Active. Medications Reconciled No Current Medications  Social History  Alcohol use Occasional alcohol use. Caffeine use Coffee,  Tea. No drug use Tobacco use Never smoker.  Family History  Alcohol Abuse Brother, Mother. Arthritis Mother. Breast Cancer Mother. Heart Disease Mother. Migraine Headache Mother. Thyroid problems Mother.  Pregnancy / Birth History  Age at menarche 45 years. Age of menopause <43 Gravida 3 Irregular periods Length (months) of breastfeeding 7-12 Maternal age 49-35 Para 2  Other Problems  Gastric Ulcer High blood pressure Migraine Headache Sleep Apnea    Vitals  Weight: 236 lb Height: 61in Body Surface Area: 2.03 m Body Mass Index: 44.59 kg/mA Temp.: 98.21F(Oral)  Pulse: 89 (Regular)  BP: 142/98 (Sitting, Left Arm, Standard)      Physical Exam  The physical exam findings are as follows: Note:WDWN in NAD Eyes: Pupils equal, round; sclera anicteric HENT: Oral mucosa moist; good dentition Neck: No masses palpated, no thyromegaly Lungs: CTA bilaterally; normal respiratory effort CV: Regular rate and rhythm; no murmurs; extremities well-perfused with no edema Abd: +bowel sounds, soft, non-tender, no palpable organomegaly;Midline incision - center 2 cm is open.  No surrounding erythema or purulent drainage Wound shows persistent slow oozing Skin: Warm, dry; no sign of jaundice Psychiatric - alert and oriented x 4; calm mood and affect    Assessment & Plan  VENTRAL HERNIA WITHOUT OBSTRUCTION OR GANGRENE (K43.9) Bleeding abdominal wound  Current Plans Schedule for Surgery - abdominal wound exploration. The surgical procedure has been discussed with the patient.Potential risks, benefits, alternative treatments, and expected outcomes have been explained. All of the patient's questions at this time have been answered. The likelihood of reaching the patient's treatment goal is good. The patient understand the proposed surgical procedure and wishes to proceed.  [name]. Georgette Dover, MD, [facility]  12/28/2017 2:31 PM

## 2017-12-28 NOTE — H&P (Signed)
History of Present Illness  The patient is a 57 year old female who presents after an abdominal wall hernia repair. PCP - Sheryn Bison GYN - Nelda Marseille  This is a 57 year old female who has had a protruding mass to the right of her umbilicus for at least 18 years. She has had 2 previous C-sections as well as a previous laparoscopic cholecystectomy. Over the last few years she has noticed worsening pain in her right abdominal wall mass. She has had several episodes that are quite severe and have resulted in nausea and vomiting.The mass remains reducible. She has not had any recent imaging. She has lost some weight over the last year. She underwent ventral hernia repair with mesh on 12/05/17 with an 11.4 cm Ventrio mesh.  She was doing well until she developed some oozing from her incision.  We opened the center 2 cm of the incision and the patient has had persistent bleeding from this area.  We attempted to treat this area with packing and Surgicel, but it continues to ooze.  The area does not look infected and there has been no purulent drainage.  She presents now for wound exploration to find the source of bleeding.  Problem List/Past Medical  VENTRAL HERNIA WITHOUT OBSTRUCTION OR GANGRENE (K43.9)  Past Surgical History  Cesarean Section - Multiple Gallbladder Surgery - Laparoscopic Oral Surgery  Diagnostic Studies History  Colonoscopy 1-5 years ago Pap Smear 1-5 years ago  Allergies  Codeine Phosphate *ANALGESICS - OPIOID Lidocaine HCl *CHEMICALS* allergic to all meds ending in caine. Allergies Reconciled  Medication History  Amoxicillin(Oral) Specific strength unknown - Active. Mucinex (Oral) Specific strength unknown - Active. Benzonatate (Oral) Specific strength unknown - Active. Allegra Allergy (Oral) Specific strength unknown - Active. Medications Reconciled No Current Medications  Social History  Alcohol use Occasional alcohol use. Caffeine use Coffee,  Tea. No drug use Tobacco use Never smoker.  Family History  Alcohol Abuse Brother, Mother. Arthritis Mother. Breast Cancer Mother. Heart Disease Mother. Migraine Headache Mother. Thyroid problems Mother.  Pregnancy / Birth History  Age at menarche 36 years. Age of menopause <43 Gravida 3 Irregular periods Length (months) of breastfeeding 7-12 Maternal age 69-35 Para 2  Other Problems  Gastric Ulcer High blood pressure Migraine Headache Sleep Apnea    Vitals  Weight: 236 lb Height: 61in Body Surface Area: 2.03 m Body Mass Index: 44.59 kg/mA Temp.: 98.54F(Oral)  Pulse: 89 (Regular)  BP: 142/98 (Sitting, Left Arm, Standard)      Physical Exam  The physical exam findings are as follows: Note:WDWN in NAD Eyes: Pupils equal, round; sclera anicteric HENT: Oral mucosa moist; good dentition Neck: No masses palpated, no thyromegaly Lungs: CTA bilaterally; normal respiratory effort CV: Regular rate and rhythm; no murmurs; extremities well-perfused with no edema Abd: +bowel sounds, soft, non-tender, no palpable organomegaly;Midline incision - center 2 cm is open.  No surrounding erythema or purulent drainage Wound shows persistent slow oozing Skin: Warm, dry; no sign of jaundice Psychiatric - alert and oriented x 4; calm mood and affect    Assessment & Plan  VENTRAL HERNIA WITHOUT OBSTRUCTION OR GANGRENE (K43.9) Bleeding abdominal wound  Current Plans Schedule for Surgery - abdominal wound exploration. The surgical procedure has been discussed with the patient.Potential risks, benefits, alternative treatments, and expected outcomes have been explained. All of the patient's questions at this time have been answered. The likelihood of reaching the patient's treatment goal is good. The patient understand the proposed surgical procedure and wishes to proceed.  [name]. Georgette Dover, MD, [facility]  12/28/2017 2:31 PM

## 2017-12-29 ENCOUNTER — Ambulatory Visit (HOSPITAL_COMMUNITY)
Admission: RE | Admit: 2017-12-29 | Discharge: 2017-12-29 | Disposition: A | Discharge status: Home / Self Care | Payer: 59 | Source: Ambulatory Visit | Attending: Surgery | Admitting: Surgery

## 2017-12-29 ENCOUNTER — Encounter (HOSPITAL_COMMUNITY)
Admission: RE | Disposition: A | Discharge status: Home / Self Care | Payer: Self-pay | Source: Ambulatory Visit | Attending: Surgery

## 2017-12-29 ENCOUNTER — Ambulatory Visit (HOSPITAL_COMMUNITY): Payer: 59 | Admitting: Anesthesiology

## 2017-12-29 ENCOUNTER — Encounter (HOSPITAL_COMMUNITY): Payer: Self-pay | Admitting: *Deleted

## 2017-12-29 DIAGNOSIS — Z8759 Personal history of other complications of pregnancy, childbirth and the puerperium: Secondary | ICD-10-CM | POA: Insufficient documentation

## 2017-12-29 DIAGNOSIS — E669 Obesity, unspecified: Secondary | ICD-10-CM | POA: Diagnosis not present

## 2017-12-29 DIAGNOSIS — Z6841 Body Mass Index (BMI) 40.0 and over, adult: Secondary | ICD-10-CM | POA: Insufficient documentation

## 2017-12-29 DIAGNOSIS — T8189XA Other complications of procedures, not elsewhere classified, initial encounter: Secondary | ICD-10-CM | POA: Insufficient documentation

## 2017-12-29 DIAGNOSIS — Z9049 Acquired absence of other specified parts of digestive tract: Secondary | ICD-10-CM | POA: Diagnosis not present

## 2017-12-29 DIAGNOSIS — G4730 Sleep apnea, unspecified: Secondary | ICD-10-CM | POA: Insufficient documentation

## 2017-12-29 HISTORY — DX: Gastro-esophageal reflux disease without esophagitis: K21.9

## 2017-12-29 HISTORY — PX: WOUND EXPLORATION: SHX6188

## 2017-12-29 HISTORY — DX: Unspecified osteoarthritis, unspecified site: M19.90

## 2017-12-29 HISTORY — DX: Presence of spectacles and contact lenses: Z97.3

## 2017-12-29 HISTORY — DX: Sleep apnea, unspecified: G47.30

## 2017-12-29 HISTORY — DX: Headache, unspecified: R51.9

## 2017-12-29 HISTORY — DX: Adverse effect of unspecified anesthetic, initial encounter: T41.45XA

## 2017-12-29 HISTORY — DX: Headache: R51

## 2017-12-29 HISTORY — DX: Other complications of anesthesia, initial encounter: T88.59XA

## 2017-12-29 HISTORY — DX: Anemia, unspecified: D64.9

## 2017-12-29 LAB — BASIC METABOLIC PANEL
Anion gap: 11 (ref 5–15)
BUN: 12 mg/dL (ref 6–20)
CO2: 21 mmol/L — ABNORMAL LOW (ref 22–32)
Calcium: 9.1 mg/dL (ref 8.9–10.3)
Chloride: 107 mmol/L (ref 98–111)
Creatinine, Ser: 0.56 mg/dL (ref 0.44–1.00)
GFR calc Af Amer: >60 mL/min (ref >60)
GFR calc non Af Amer: >60 mL/min (ref >60)
Glucose, Bld: 104 mg/dL — ABNORMAL HIGH (ref 70–99)
Potassium: 4.4 mmol/L (ref 3.5–5.1)
Sodium: 139 mmol/L (ref 135–145)

## 2017-12-29 LAB — CBC
HCT: 49.2 % — ABNORMAL HIGH (ref 36.0–46.0)
Hemoglobin: 15 g/dL (ref 12.0–15.0)
MCH: 22 pg — ABNORMAL LOW (ref 26.0–34.0)
MCHC: 30.5 g/dL (ref 30.0–36.0)
MCV: 72.1 fL — ABNORMAL LOW (ref 78.0–100.0)
Platelets: 1074 10*3/uL (ref 150–400)
RBC: 6.82 MIL/uL — ABNORMAL HIGH (ref 3.87–5.11)
RDW: 18.9 % — ABNORMAL HIGH (ref 11.5–15.5)
WBC: 14.1 10*3/uL — ABNORMAL HIGH (ref 4.0–10.5)

## 2017-12-29 SURGERY — WOUND EXPLORATION
Anesthesia: General | Site: Abdomen

## 2017-12-29 MED ORDER — MIDAZOLAM HCL 2 MG/2ML IJ SOLN
INTRAMUSCULAR | Status: AC
Start: 2017-12-29 — End: ?
  Filled 2017-12-29: qty 2

## 2017-12-29 MED ORDER — CHLORHEXIDINE GLUCONATE CLOTH 2 % EX PADS: 6.0000 | MEDICATED_PAD | Freq: Once | CUTANEOUS | Status: DC

## 2017-12-29 MED ORDER — ROCURONIUM BROMIDE 100 MG/10ML IV SOLN
INTRAVENOUS | Status: DC | PRN
  Administered 2017-12-29: 60 mg via INTRAVENOUS

## 2017-12-29 MED ORDER — ONDANSETRON HCL 4 MG/2ML IJ SOLN
INTRAMUSCULAR | Status: AC
  Filled 2017-12-29: qty 2

## 2017-12-29 MED ORDER — HEMOSTATIC AGENTS (NO CHARGE) OPTIME
TOPICAL | Status: DC | PRN
  Administered 2017-12-29: 1 via TOPICAL

## 2017-12-29 MED ORDER — 0.9 % SODIUM CHLORIDE (POUR BTL) OPTIME
TOPICAL | Status: DC | PRN
  Administered 2017-12-29 (×2): 1000 mL

## 2017-12-29 MED ORDER — DEXAMETHASONE SODIUM PHOSPHATE 10 MG/ML IJ SOLN
INTRAMUSCULAR | Status: AC
  Filled 2017-12-29: qty 1

## 2017-12-29 MED ORDER — ONDANSETRON HCL 4 MG/2ML IJ SOLN
INTRAMUSCULAR | Status: DC | PRN
  Administered 2017-12-29: 4 mg via INTRAVENOUS

## 2017-12-29 MED ORDER — SUGAMMADEX SODIUM 200 MG/2ML IV SOLN
INTRAVENOUS | Status: DC | PRN
  Administered 2017-12-29: 200 mg via INTRAVENOUS

## 2017-12-29 MED ORDER — CEFAZOLIN SODIUM-DEXTROSE 2-4 GM/100ML-% IV SOLN
2.0000 g | INTRAVENOUS | Status: AC
  Administered 2017-12-29: 2 g via INTRAVENOUS
  Filled 2017-12-29: qty 100

## 2017-12-29 MED ORDER — LIDOCAINE 2% (20 MG/ML) 5 ML SYRINGE
INTRAMUSCULAR | Status: AC
  Filled 2017-12-29: qty 10

## 2017-12-29 MED ORDER — OXYCODONE HCL 5 MG PO TABS: 5.0000 mg | ORAL_TABLET | Freq: Four times a day (QID) | ORAL | 0 refills | Status: DC | PRN

## 2017-12-29 MED ORDER — LACTATED RINGERS IV SOLN: INTRAVENOUS | Status: DC

## 2017-12-29 MED ORDER — PROPOFOL 10 MG/ML IV BOLUS
INTRAVENOUS | Status: DC | PRN
  Administered 2017-12-29: 200 mg via INTRAVENOUS

## 2017-12-29 MED ORDER — MEPERIDINE HCL 50 MG/ML IJ SOLN: 6.2500 mg | INTRAMUSCULAR | Status: DC | PRN

## 2017-12-29 MED ORDER — ROCURONIUM BROMIDE 10 MG/ML (PF) SYRINGE
PREFILLED_SYRINGE | INTRAVENOUS | Status: AC
Start: 2017-12-29 — End: ?
  Filled 2017-12-29: qty 20

## 2017-12-29 MED ORDER — PROPOFOL 10 MG/ML IV BOLUS
INTRAVENOUS | Status: AC
  Filled 2017-12-29: qty 20

## 2017-12-29 MED ORDER — FENTANYL CITRATE (PF) 250 MCG/5ML IJ SOLN
INTRAMUSCULAR | Status: AC
Start: 2017-12-29 — End: ?
  Filled 2017-12-29: qty 5

## 2017-12-29 MED ORDER — BUPIVACAINE-EPINEPHRINE (PF) 0.25% -1:200000 IJ SOLN
INTRAMUSCULAR | Status: DC | PRN
  Administered 2017-12-29: 10 mL

## 2017-12-29 MED ORDER — FENTANYL CITRATE (PF) 100 MCG/2ML IJ SOLN
INTRAMUSCULAR | Status: DC | PRN
  Administered 2017-12-29 (×5): 50 ug via INTRAVENOUS

## 2017-12-29 MED ORDER — DEXAMETHASONE SODIUM PHOSPHATE 10 MG/ML IJ SOLN
INTRAMUSCULAR | Status: DC | PRN
  Administered 2017-12-29: 10 mg via INTRAVENOUS

## 2017-12-29 MED ORDER — MIDAZOLAM HCL 5 MG/5ML IJ SOLN
INTRAMUSCULAR | Status: DC | PRN
  Administered 2017-12-29: 2 mg via INTRAVENOUS

## 2017-12-29 MED ORDER — PHENYLEPHRINE 40 MCG/ML (10ML) SYRINGE FOR IV PUSH (FOR BLOOD PRESSURE SUPPORT)
PREFILLED_SYRINGE | INTRAVENOUS | Status: AC
  Filled 2017-12-29: qty 10

## 2017-12-29 MED ORDER — METOCLOPRAMIDE HCL 5 MG/ML IJ SOLN: 10.0000 mg | Freq: Once | INTRAMUSCULAR | Status: DC | PRN

## 2017-12-29 MED ORDER — LIDOCAINE HCL (CARDIAC) PF 100 MG/5ML IV SOSY
PREFILLED_SYRINGE | INTRAVENOUS | Status: DC | PRN
  Administered 2017-12-29: 100 mg via INTRAVENOUS

## 2017-12-29 MED ORDER — LACTATED RINGERS IV SOLN
INTRAVENOUS | Status: DC
  Administered 2017-12-29 (×2): via INTRAVENOUS

## 2017-12-29 MED ORDER — BUPIVACAINE-EPINEPHRINE (PF) 0.25% -1:200000 IJ SOLN
INTRAMUSCULAR | Status: AC
  Filled 2017-12-29: qty 60

## 2017-12-29 MED ORDER — FENTANYL CITRATE (PF) 100 MCG/2ML IJ SOLN: 25.0000 ug | INTRAMUSCULAR | Status: DC | PRN

## 2017-12-29 SURGICAL SUPPLY — 34 items
CANISTER SUCT 3000ML PPV (MISCELLANEOUS) ×2 IMPLANT
CHLORAPREP W/TINT 26ML (MISCELLANEOUS) ×2 IMPLANT
COVER SURGICAL LIGHT HANDLE (MISCELLANEOUS) ×2 IMPLANT
DRAIN PENROSE 1/2X12 LTX STRL (WOUND CARE) ×2 IMPLANT
DRAPE LAPAROSCOPIC ABDOMINAL (DRAPES) ×2 IMPLANT
ELECT BLADE 6.5 EXT (BLADE) ×2 IMPLANT
ELECT CAUTERY BLADE 6.4 (BLADE) ×2 IMPLANT
ELECT REM PT RETURN 9FT ADLT (ELECTROSURGICAL) ×2
ELECTRODE REM PT RTRN 9FT ADLT (ELECTROSURGICAL) ×1 IMPLANT
GAUZE SPONGE 4X4 12PLY STRL (GAUZE/BANDAGES/DRESSINGS) ×2 IMPLANT
GLOVE BIO SURGEON STRL SZ7 (GLOVE) ×4 IMPLANT
GLOVE BIOGEL PI IND STRL 6.5 (GLOVE) ×1 IMPLANT
GLOVE BIOGEL PI IND STRL 7.0 (GLOVE) ×1 IMPLANT
GLOVE BIOGEL PI IND STRL 7.5 (GLOVE) ×1 IMPLANT
GLOVE BIOGEL PI INDICATOR 6.5 (GLOVE) ×1
GLOVE BIOGEL PI INDICATOR 7.0 (GLOVE) ×1
GLOVE BIOGEL PI INDICATOR 7.5 (GLOVE) ×1
GOWN STRL REUS W/ TWL LRG LVL3 (GOWN DISPOSABLE) ×2 IMPLANT
GOWN STRL REUS W/TWL LRG LVL3 (GOWN DISPOSABLE) ×2
HEMOSTAT ARISTA ABSORB 3G PWDR (MISCELLANEOUS) ×2 IMPLANT
KIT BASIN OR (CUSTOM PROCEDURE TRAY) ×2 IMPLANT
KIT TURNOVER KIT B (KITS) ×2 IMPLANT
NS IRRIG 1000ML POUR BTL (IV SOLUTION) ×4 IMPLANT
PACK GENERAL/GYN (CUSTOM PROCEDURE TRAY) ×2 IMPLANT
PAD ARMBOARD 7.5X6 YLW CONV (MISCELLANEOUS) ×2 IMPLANT
STAPLER VISISTAT 35W (STAPLE) ×4 IMPLANT
SUT ETHILON 2 0 FS 18 (SUTURE) ×2 IMPLANT
SUT PDS AB 1 TP1 96 (SUTURE) IMPLANT
SUT SILK 2 0 SH CR/8 (SUTURE) ×2 IMPLANT
SUT SILK 2 0 TIES 10X30 (SUTURE) ×2 IMPLANT
SUT SILK 3 0 SH CR/8 (SUTURE) ×2 IMPLANT
SUT SILK 3 0 TIES 10X30 (SUTURE) ×2 IMPLANT
SUT VIC AB 2-0 SH 18 (SUTURE) ×2 IMPLANT
TAPE CLOTH SURG 4X10 WHT LF (GAUZE/BANDAGES/DRESSINGS) ×2 IMPLANT

## 2017-12-29 NOTE — Op Note (Addendum)
Preop diagnosis: Bleeding abdominal wound Postop diagnosis: Same Procedure performed: Abdominal wound exploration Surgeon:Jeronimo Hellberg K Val Schiavo Anesthesia: General Indications: This is a 57 year old female who had a large umbilical hernia protruding to the right of her abdomen for the last 18 years.  On 12/05/2017 she underwent open ventral hernia repair with mesh with an 11.4 cm ventrio mesh.  She has developed bleeding through a small opening in the center of her incision.  We open the wound 2 cm in the office and attempted to pack the wound.  I cannot visualize a site of bleeding.  The area continues to ooze so I recommended exploration of the wound under anesthesia.  Description of procedure: The patient is brought to the operating room and placed in the supine position on the operating room table.  After an adequate level of general anesthesia was obtained, her abdomen was prepped with Betadine and draped in sterile fashion.  A timeout was taken to ensure the proper patient and proper procedure.  We infiltrated the old incision with 0.25% Marcaine with epinephrine.  I extended the skin opening throughout the length of the incision.  We dissected through the subcutaneous tissues until we entered a large cavity.  This is all anterior to the fascia.  The fascia is intact.  The patient has a large cavity protruding towards the right which was the former location of the hernia sac.  We evacuated some old clot.  No purulent drainage is noted. At the most distal aspect of the seroma/hematoma cavity to the right, there is an area of persistent oozing.  We thoroughly cauterized this area.  We irrigated the entire cavity thoroughly.  We cauterized any areas that seem to be using.  I observe this cavity for several minutes and no further bleeding was noted.  We used Arista powder to cover the sides of the cavity.  I placed a 0.5 inch Penrose drain into this large cavity on the right.  This was secured to the skin with 2-0  nylon sutures.  I closed some of the subcutaneous tissue around the drain with interrupted 3-0 Vicryl sutures.  Staples were used to close the skin around the drain.  A dry dressing was applied.  The patient is then extubated and brought to the recovery room in stable condition.  All sponge, instrument, and needle counts are correct.  [name]. Georgette Dover, MD, [facility]  General/ Trauma Surgery  12/29/2017 2:28 PM

## 2017-12-29 NOTE — Anesthesia Procedure Notes (Signed)
Procedure Name: Intubation Date/Time: 12/29/2017 1:47 PM Performed by: Irven Baltimore, RN Pre-anesthesia Checklist: Patient identified, Emergency Drugs available, Suction available, Patient being monitored and Timeout performed Patient Re-evaluated:Patient Re-evaluated prior to induction Oxygen Delivery Method: Circle system utilized Preoxygenation: Pre-oxygenation with 100% oxygen Induction Type: IV induction Ventilation: Mask ventilation without difficulty Laryngoscope Size: Miller and 2 Grade View: Grade I Tube type: Oral Tube size: 7.0 mm Number of attempts: 1 Airway Equipment and Method: Stylet Placement Confirmation: ETT inserted through vocal cords under direct vision,  positive ETCO2,  CO2 detector and breath sounds checked- equal and bilateral Secured at: 19 cm Tube secured with: Tape Dental Injury: Teeth and Oropharynx as per pre-operative assessment

## 2017-12-29 NOTE — Progress Notes (Signed)
CRITICAL VALUE ALERT  Critical Value:  1,074 to the third  Date & Time Notied:  12:25 PM 12/29/17   Provider Notified: Dr. Marcell Barlow made aware, requested that Dr Georgette Dover be made aware  Orders Received/Actions taken: Dr. Georgette Dover paged 12:34 PM, no further orders recieved

## 2017-12-29 NOTE — Anesthesia Preprocedure Evaluation (Signed)
Anesthesia Evaluation  Patient identified by MRN, date of birth, ID band Patient awake    Reviewed: Allergy & Precautions, NPO status , Patient's Chart, lab work & pertinent test results  Airway Mallampati: II  TM Distance: >3 FB Neck ROM: Full    Dental no notable dental hx.    Pulmonary neg pulmonary ROS,    Pulmonary exam normal breath sounds clear to auscultation       Cardiovascular negative cardio ROS Normal cardiovascular exam Rhythm:Regular Rate:Normal     Neuro/Psych negative neurological ROS  negative psych ROS   GI/Hepatic negative GI ROS, Neg liver ROS,   Endo/Other  obesity  Renal/GU negative Renal ROS  negative genitourinary   Musculoskeletal negative musculoskeletal ROS (+)   Abdominal   Peds negative pediatric ROS (+)  Hematology negative hematology ROS (+)   Anesthesia Other Findings   Reproductive/Obstetrics negative OB ROS                             Anesthesia Physical  Anesthesia Plan  ASA: II  Anesthesia Plan: General   Post-op Pain Management:    Induction: Intravenous  PONV Risk Score and Plan: 3 and Ondansetron, Dexamethasone, Midazolam and Treatment may vary due to age or medical condition  Airway Management Planned: Oral ETT  Additional Equipment:   Intra-op Plan:   Post-operative Plan: Extubation in OR  Informed Consent: I have reviewed the patients History and Physical, chart, labs and discussed the procedure including the risks, benefits and alternatives for the proposed anesthesia with the patient or authorized representative who has indicated his/her understanding and acceptance.   Dental advisory given  Plan Discussed with: CRNA and Surgeon  Anesthesia Plan Comments:         Anesthesia Quick Evaluation

## 2017-12-29 NOTE — Interval H&P Note (Signed)
History and Physical Interval Note:  12/29/2017 12:08 PM  Connie Myers  has presented today for surgery, with the diagnosis of BLEEDING ABDOMINAL WOUND  The various methods of treatment have been discussed with the patient and family. After consideration of risks, benefits and other options for treatment, the patient has consented to  Procedure(s) with comments: ABDOMINAL WOUND EXPLORATION (N/A) - LMA as a surgical intervention .  The patient's history has been reviewed, patient examined, no change in status, stable for surgery.  I have reviewed the patient's chart and labs.  Questions were answered to the patient's satisfaction.     Maia Petties

## 2017-12-29 NOTE — Anesthesia Postprocedure Evaluation (Signed)
Anesthesia Post Note  Patient: Connie Myers  Procedure(s) Performed: ABDOMINAL WOUND EXPLORATION (N/A Abdomen)     Patient location during evaluation: PACU Anesthesia Type: General Level of consciousness: awake and alert Pain management: pain level controlled Vital Signs Assessment: post-procedure vital signs reviewed and stable Respiratory status: spontaneous breathing, nonlabored ventilation, respiratory function stable and patient connected to nasal cannula oxygen Cardiovascular status: blood pressure returned to baseline and stable Postop Assessment: no apparent nausea or vomiting Anesthetic complications: no    Last Vitals:  Vitals:   12/29/17 1448 12/29/17 1507  BP: 140/89 (!) 151/86  Pulse: 91   Resp: 20 17  Temp:    SpO2: 93% 93%    Last Pain:  Vitals:   12/29/17 1507  TempSrc:   PainSc: 4                  Montez Hageman

## 2017-12-29 NOTE — Transfer of Care (Signed)
Immediate Anesthesia Transfer of Care Note  Patient: Connie Myers  Procedure(s) Performed: ABDOMINAL WOUND EXPLORATION (N/A Abdomen)  Patient Location: PACU  Anesthesia Type:General  Level of Consciousness: awake, alert  and oriented  Airway & Oxygen Therapy: Patient Spontanous Breathing  Post-op Assessment: Report given to RN and Post -op Vital signs reviewed and stable  Post vital signs: Reviewed and stable  Last Vitals:  Vitals Value Taken Time  BP 149/86 12/29/2017  2:33 PM  Temp    Pulse 85 12/29/2017  2:37 PM  Resp 17 12/29/2017  2:37 PM  SpO2 96 % 12/29/2017  2:37 PM  Vitals shown include unvalidated device data.  Last Pain:  Vitals:   12/29/17 1119  TempSrc:   PainSc: 0-No pain      Patients Stated Pain Goal: 2 (52/08/02 2336)  Complications: No apparent anesthesia complications

## 2017-12-29 NOTE — Discharge Instructions (Signed)
[place] Office Phone Number [phone]  POST OP INSTRUCTIONS  Always review your discharge instruction sheet given to you by the facility where your surgery was performed.  IF YOU HAVE DISABILITY OR FAMILY LEAVE FORMS, YOU MUST BRING THEM TO THE OFFICE FOR PROCESSING.  DO NOT GIVE THEM TO YOUR DOCTOR.   Take your pain medication as prescribed, if needed.  If narcotic pain medicine is not needed, then you may take acetaminophen (Tylenol) or ibuprofen (Advil) as needed. 1. Take your usually prescribed medications unless otherwise directed 2. If you need a refill on your pain medication, please contact your pharmacy.  They will contact our office to request authorization.  Prescriptions will not be filled after 5pm or on week-ends. 3. You should eat very light the first 24 hours after surgery, such as soup, crackers, pudding, etc.  Resume your normal diet the day after surgery. 4. Most patients will experience some swelling and bruising around the surgical site.  Ice packs will help.  Swelling and bruising can take several days to resolve.  5. It is common to experience some constipation if taking pain medication after surgery.  Increasing fluid intake and taking a stool softener will usually help or prevent this problem from occurring.  A mild laxative (Milk of Magnesia or Miralax) should be taken according to package directions if there are no bowel movements after 48 hours. 6. Change the dressing over the wound at least twice daily.  Leave the dressing in place if you need to take a shower.  Change the dressing after your shower. 7. ACTIVITIES:  You may resume regular daily activities (gradually increasing) beginning the next day.   You may have sexual intercourse when it is comfortable. a. You may drive when you no longer are taking prescription pain medication, you can comfortably wear a seatbelt, and you can safely maneuver your car and apply brakes. b. RETURN TO WORK:   1-2 weeks 8. You should see your doctor in the office for a follow-up appointment approximately two to three weeks after your surgery.    WHEN TO CALL YOUR DOCTOR: 1. Fever over 101.0 2. Nausea and/or vomiting. 3. Extreme swelling or bruising. 4. Continued bleeding from incision. 5. Increased pain, redness, or drainage from the incision.  The clinic staff is available to answer your questions during regular business hours.  Please dont hesitate to call and ask to speak to one of the nurses for clinical concerns.  If you have a medical emergency, go to the nearest emergency room or call 911.  A surgeon from [facility] is always on call at the hospital.  For further questions, please visit [email or web address]    Post Anesthesia Home Care Instructions  Activity: Get plenty of rest for the remainder of the day. A responsible individual must stay with you for 24 hours following the procedure.  For the next 24 hours, DO NOT: -Drive a car -Paediatric nurse -Drink alcoholic beverages -Take any medication unless instructed by your physician -Make any legal decisions or sign important papers.  Meals: Start with liquid foods such as gelatin or soup. Progress to regular foods as tolerated. Avoid greasy, spicy, heavy foods. If nausea and/or vomiting occur, drink only clear liquids until the nausea and/or vomiting subsides. Call your physician if vomiting continues.  Special Instructions/Symptoms: Your throat may feel dry or sore from the anesthesia or the breathing tube placed in your throat during surgery. If this causes discomfort, gargle with warm salt water. The  discomfort should disappear within 24 hours.  If you had a scopolamine patch placed behind your ear for the management of post- operative nausea and/or vomiting:  1. The medication in the patch is effective for 72 hours, after which it should be removed.  Wrap patch in a tissue and discard in the trash. Wash  hands thoroughly with soap and water. 2. You may remove the patch earlier than 72 hours if you experience unpleasant side effects which may include dry mouth, dizziness or visual disturbances. 3. Avoid touching the patch. Wash your hands with soap and water after contact with the patch.

## 2017-12-30 ENCOUNTER — Encounter (HOSPITAL_COMMUNITY): Payer: Self-pay | Admitting: Surgery

## 2018-01-01 LAB — PATHOLOGIST SMEAR REVIEW

## 2018-04-25 ENCOUNTER — Encounter (HOSPITAL_BASED_OUTPATIENT_CLINIC_OR_DEPARTMENT_OTHER): Payer: 59 | Attending: Family Medicine

## 2018-04-25 ENCOUNTER — Other Ambulatory Visit (HOSPITAL_COMMUNITY)
Admission: RE | Admit: 2018-04-25 | Discharge: 2018-04-25 | Disposition: A | Discharge status: Home / Self Care | Payer: 59 | Source: Ambulatory Visit | Attending: Internal Medicine | Admitting: Internal Medicine

## 2018-04-25 DIAGNOSIS — L98492 Non-pressure chronic ulcer of skin of other sites with fat layer exposed: Secondary | ICD-10-CM | POA: Insufficient documentation

## 2018-04-25 DIAGNOSIS — B9561 Methicillin susceptible Staphylococcus aureus infection as the cause of diseases classified elsewhere: Secondary | ICD-10-CM | POA: Insufficient documentation

## 2018-04-25 DIAGNOSIS — Y838 Other surgical procedures as the cause of abnormal reaction of the patient, or of later complication, without mention of misadventure at the time of the procedure: Secondary | ICD-10-CM | POA: Diagnosis not present

## 2018-04-25 DIAGNOSIS — T8131XA Disruption of external operation (surgical) wound, not elsewhere classified, initial encounter: Secondary | ICD-10-CM | POA: Diagnosis present

## 2018-04-26 ENCOUNTER — Other Ambulatory Visit (HOSPITAL_BASED_OUTPATIENT_CLINIC_OR_DEPARTMENT_OTHER): Payer: Self-pay | Admitting: Internal Medicine

## 2018-04-26 DIAGNOSIS — L0291 Cutaneous abscess, unspecified: Secondary | ICD-10-CM

## 2018-04-28 LAB — AEROBIC CULTURE W GRAM STAIN (SUPERFICIAL SPECIMEN)

## 2018-05-02 ENCOUNTER — Other Ambulatory Visit (HOSPITAL_BASED_OUTPATIENT_CLINIC_OR_DEPARTMENT_OTHER): Payer: Self-pay | Admitting: Internal Medicine

## 2018-05-02 ENCOUNTER — Ambulatory Visit (HOSPITAL_COMMUNITY)
Admission: RE | Admit: 2018-05-02 | Discharge: 2018-05-02 | Disposition: A | Discharge status: Home / Self Care | Payer: 59 | Source: Ambulatory Visit | Attending: Internal Medicine | Admitting: Internal Medicine

## 2018-05-02 DIAGNOSIS — L0291 Cutaneous abscess, unspecified: Secondary | ICD-10-CM | POA: Insufficient documentation

## 2018-05-02 DIAGNOSIS — T8131XA Disruption of external operation (surgical) wound, not elsewhere classified, initial encounter: Secondary | ICD-10-CM | POA: Diagnosis not present

## 2018-05-03 ENCOUNTER — Other Ambulatory Visit: Payer: Self-pay | Admitting: Physician Assistant

## 2018-05-03 DIAGNOSIS — T8189XA Other complications of procedures, not elsewhere classified, initial encounter: Secondary | ICD-10-CM

## 2018-05-03 DIAGNOSIS — S31105A Unspecified open wound of abdominal wall, periumbilic region without penetration into peritoneal cavity, initial encounter: Secondary | ICD-10-CM

## 2018-05-09 ENCOUNTER — Ambulatory Visit (HOSPITAL_COMMUNITY): Admission: RE | Admit: 2018-05-09 | Payer: 59 | Source: Ambulatory Visit

## 2018-05-10 DIAGNOSIS — T8131XA Disruption of external operation (surgical) wound, not elsewhere classified, initial encounter: Secondary | ICD-10-CM | POA: Diagnosis not present

## 2018-05-23 ENCOUNTER — Ambulatory Visit (HOSPITAL_COMMUNITY)
Admission: RE | Admit: 2018-05-23 | Discharge: 2018-05-23 | Disposition: A | Discharge status: Home / Self Care | Payer: 59 | Source: Ambulatory Visit | Attending: Surgery | Admitting: Surgery

## 2018-05-23 DIAGNOSIS — S31105A Unspecified open wound of abdominal wall, periumbilic region without penetration into peritoneal cavity, initial encounter: Secondary | ICD-10-CM | POA: Diagnosis present

## 2018-05-23 DIAGNOSIS — T8189XA Other complications of procedures, not elsewhere classified, initial encounter: Secondary | ICD-10-CM | POA: Insufficient documentation

## 2018-05-23 MED ORDER — SODIUM CHLORIDE (PF) 0.9 % IJ SOLN
INTRAMUSCULAR | Status: AC
  Filled 2018-05-23: qty 50

## 2018-05-23 MED ORDER — IOHEXOL 300 MG/ML  SOLN
100.0000 mL | Freq: Once | INTRAMUSCULAR | Status: AC | PRN
  Administered 2018-05-23: 100 mL via INTRAVENOUS

## 2018-05-24 ENCOUNTER — Encounter (HOSPITAL_BASED_OUTPATIENT_CLINIC_OR_DEPARTMENT_OTHER): Payer: 59 | Attending: Internal Medicine

## 2018-05-24 DIAGNOSIS — Y838 Other surgical procedures as the cause of abnormal reaction of the patient, or of later complication, without mention of misadventure at the time of the procedure: Secondary | ICD-10-CM | POA: Insufficient documentation

## 2018-05-24 DIAGNOSIS — T8131XA Disruption of external operation (surgical) wound, not elsewhere classified, initial encounter: Secondary | ICD-10-CM | POA: Insufficient documentation

## 2018-06-08 ENCOUNTER — Emergency Department (HOSPITAL_COMMUNITY)
Admission: EM | Admit: 2018-06-08 | Discharge: 2018-06-08 | Disposition: A | Discharge status: Home / Self Care | Payer: 59 | Attending: Emergency Medicine | Admitting: Emergency Medicine

## 2018-06-08 DIAGNOSIS — T8131XA Disruption of external operation (surgical) wound, not elsewhere classified, initial encounter: Secondary | ICD-10-CM | POA: Diagnosis not present

## 2018-06-08 DIAGNOSIS — Y658 Other specified misadventures during surgical and medical care: Secondary | ICD-10-CM | POA: Insufficient documentation

## 2018-06-08 DIAGNOSIS — Y999 Unspecified external cause status: Secondary | ICD-10-CM | POA: Diagnosis not present

## 2018-06-08 DIAGNOSIS — S3991XA Unspecified injury of abdomen, initial encounter: Secondary | ICD-10-CM | POA: Diagnosis present

## 2018-06-08 DIAGNOSIS — S31109A Unspecified open wound of abdominal wall, unspecified quadrant without penetration into peritoneal cavity, initial encounter: Secondary | ICD-10-CM | POA: Diagnosis not present

## 2018-06-08 DIAGNOSIS — Y929 Unspecified place or not applicable: Secondary | ICD-10-CM | POA: Insufficient documentation

## 2018-06-08 DIAGNOSIS — T148XXA Other injury of unspecified body region, initial encounter: Secondary | ICD-10-CM

## 2018-06-08 DIAGNOSIS — Y939 Activity, unspecified: Secondary | ICD-10-CM | POA: Insufficient documentation

## 2018-06-08 DIAGNOSIS — Z7982 Long term (current) use of aspirin: Secondary | ICD-10-CM | POA: Insufficient documentation

## 2018-06-08 DIAGNOSIS — R1013 Epigastric pain: Secondary | ICD-10-CM

## 2018-06-08 LAB — CBC WITH DIFFERENTIAL/PLATELET
Abs Immature Granulocytes: 0.06 10*3/uL (ref 0.00–0.07)
Basophils Absolute: 0.2 10*3/uL — ABNORMAL HIGH (ref 0.0–0.1)
Basophils Relative: 2 %
Eosinophils Absolute: 0.8 10*3/uL — ABNORMAL HIGH (ref 0.0–0.5)
Eosinophils Relative: 9 %
HCT: 47.3 % — ABNORMAL HIGH (ref 36.0–46.0)
Hemoglobin: 14.7 g/dL (ref 12.0–15.0)
Immature Granulocytes: 1 %
Lymphocytes Relative: 20 %
Lymphs Abs: 1.7 10*3/uL (ref 0.7–4.0)
MCH: 22.7 pg — ABNORMAL LOW (ref 26.0–34.0)
MCHC: 31.1 g/dL (ref 30.0–36.0)
MCV: 72.9 fL — ABNORMAL LOW (ref 80.0–100.0)
Monocytes Absolute: 1 10*3/uL (ref 0.1–1.0)
Monocytes Relative: 11 %
Neutro Abs: 5 10*3/uL (ref 1.7–7.7)
Neutrophils Relative %: 57 %
Platelets: 714 10*3/uL — ABNORMAL HIGH (ref 150–400)
RBC: 6.49 MIL/uL — ABNORMAL HIGH (ref 3.87–5.11)
RDW: 17.2 % — ABNORMAL HIGH (ref 11.5–15.5)
WBC: 8.7 10*3/uL (ref 4.0–10.5)
nRBC: 0 % (ref 0.0–0.2)

## 2018-06-08 LAB — COMPREHENSIVE METABOLIC PANEL
ALT: 137 U/L — ABNORMAL HIGH (ref 0–44)
AST: 141 U/L — ABNORMAL HIGH (ref 15–41)
Albumin: 3.8 g/dL (ref 3.5–5.0)
Alkaline Phosphatase: 157 U/L — ABNORMAL HIGH (ref 38–126)
Anion gap: 12 (ref 5–15)
BUN: 17 mg/dL (ref 6–20)
CO2: 22 mmol/L (ref 22–32)
Calcium: 8.6 mg/dL — ABNORMAL LOW (ref 8.9–10.3)
Chloride: 106 mmol/L (ref 98–111)
Creatinine, Ser: 0.64 mg/dL (ref 0.44–1.00)
GFR calc Af Amer: >60 mL/min (ref >60)
GFR calc non Af Amer: >60 mL/min (ref >60)
Glucose, Bld: 100 mg/dL — ABNORMAL HIGH (ref 70–99)
Potassium: 4.1 mmol/L (ref 3.5–5.1)
Sodium: 140 mmol/L (ref 135–145)
Total Bilirubin: 0.6 mg/dL (ref 0.3–1.2)
Total Protein: 7.6 g/dL (ref 6.5–8.1)

## 2018-06-08 LAB — TROPONIN I: Troponin I: <0.03 ng/mL (ref <0.03)

## 2018-06-08 MED ORDER — GABAPENTIN 100 MG PO CAPS: 100.0000 mg | ORAL_CAPSULE | Freq: Three times a day (TID) | ORAL | 0 refills | Status: DC

## 2018-06-08 MED ORDER — KETOROLAC TROMETHAMINE 30 MG/ML IJ SOLN
15.0000 mg | Freq: Once | INTRAMUSCULAR | Status: DC
  Filled 2018-06-08: qty 1

## 2018-06-08 MED ORDER — ALUM & MAG HYDROXIDE-SIMETH 200-200-20 MG/5ML PO SUSP
30.0000 mL | Freq: Once | ORAL | Status: AC
  Administered 2018-06-08: 30 mL via ORAL
  Filled 2018-06-08: qty 30

## 2018-06-08 MED ORDER — KETOROLAC TROMETHAMINE 15 MG/ML IJ SOLN
15.0000 mg | Freq: Once | INTRAMUSCULAR | Status: AC
  Administered 2018-06-08: 15 mg via INTRAVENOUS

## 2018-06-08 MED ORDER — NYSTATIN 100000 UNIT/GM EX POWD
Freq: Once | CUTANEOUS | Status: AC
  Administered 2018-06-08: 17:00:00 via TOPICAL
  Filled 2018-06-08: qty 15

## 2018-06-08 MED ORDER — FAMOTIDINE IN NACL 20-0.9 MG/50ML-% IV SOLN
20.0000 mg | Freq: Once | INTRAVENOUS | Status: AC
  Administered 2018-06-08: 20 mg via INTRAVENOUS
  Filled 2018-06-08: qty 50

## 2018-06-08 MED ORDER — FAMOTIDINE 20 MG PO TABS: 20.0000 mg | ORAL_TABLET | Freq: Two times a day (BID) | ORAL | 0 refills | Status: DC

## 2018-06-08 NOTE — Consult Note (Signed)
[facility] Surgery Consult/Admission Note  Connie Myers 1960/09/07  [id number].    Requesting MD: Dr. Vanita Panda Chief Complaint/Reason for Consult: abdominal wound  HPI:   Pt is a 57 yo female with a hx of GERD, ventral hernia repair in June 2019, reopening of wound in July 2019 by Dr. Georgette Dover, who was seen in our office on Wednesday for opening of the wound with penrose placement, presented to the ED with complaints of chest tightness, pressure, sweating, difficulty breathing and diaphoresis. We were asked to see regarding wound. She states she had increased erythema around the wound that was draining fluid before seeing Dr. Georgette Dover in our office. She complains of some pain at the wound site and the redness has not improved.   ROS:  Review of Systems  Constitutional: Positive for diaphoresis. Negative for chills and fever.  HENT: Negative for sore throat.   Respiratory: Positive for shortness of breath. Negative for cough.   Cardiovascular: Positive for chest pain.  Gastrointestinal: Negative for abdominal pain, blood in stool, constipation, diarrhea, nausea and vomiting.       + abdominal wound   Genitourinary: Negative for dysuria.  Skin: Negative for rash.  Neurological: Negative for dizziness and loss of consciousness.  All other systems reviewed and are negative.    Family History  Problem Relation Age of Onset  . Breast cancer Mother     Past Medical History:  Diagnosis Date  . Anemia   . Arthritis   . Complication of anesthesia    " I don't tolerate the caine's (Novacaine, Lidocaine etc. well "  . GERD (gastroesophageal reflux disease)   . Headache   . Sleep apnea    PMH: " I don't have it now "  . Ventral hernia   . Wears glasses     Past Surgical History:  Procedure Laterality Date  . CESAREAN SECTION    . CHOLECYSTECTOMY    . COLONOSCOPY    . HERNIA REPAIR    . INSERTION OF MESH N/A 12/05/2017   Procedure: INSERTION OF MESH;  Surgeon: Donnie Mesa, MD;  Location: [place];  Service: General;  Laterality: N/A;  . VENTRAL HERNIA REPAIR  12/05/2017   OPEN VENTRAL HERNIA REPAIR ERAS PATHWAY w/mesh  . VENTRAL HERNIA REPAIR N/A 12/05/2017   Procedure: OPEN VENTRAL HERNIA REPAIR ERAS PATHWAY;  Surgeon: Donnie Mesa, MD;  Location: [place];  Service: General;  Laterality: N/A;  . WISDOM TOOTH EXTRACTION    . WOUND EXPLORATION N/A 12/29/2017   Procedure: ABDOMINAL WOUND EXPLORATION;  Surgeon: Donnie Mesa, MD;  Location: [place];  Service: General;  Laterality: N/A;  LMA    Social History:  reports that she has never smoked. She has never used smokeless tobacco. She reports current alcohol use. She reports previous drug use.  Allergies:  Allergies  Allergen Reactions  . Codeine Other (See Comments)    GI Upset  . Local [Lidocaine]     Local Anesthesia does not work for her    (Not in a hospital admission)   Blood pressure (!) 158/74, pulse 60, temperature 97.8 F (36.6 C), temperature source Oral, resp. rate 12, SpO2 99 %.  Physical Exam Vitals signs and nursing note reviewed.  Constitutional:      General: She is not in acute distress.    Appearance: Normal appearance. She is not ill-appearing, toxic-appearing or diaphoretic.  HENT:     Head: Normocephalic and atraumatic.     Nose: Nose normal.  Mouth/Throat:     Lips: Pink.     Mouth: Mucous membranes are moist.  Eyes:     General: No scleral icterus.       Right eye: No discharge.        Left eye: No discharge.     Conjunctiva/sclera: Conjunctivae normal.     Pupils: Pupils are equal, round, and reactive to light.  Neck:     Musculoskeletal: Normal range of motion and neck supple.     Thyroid: No thyromegaly.  Cardiovascular:     Rate and Rhythm: Normal rate and regular rhythm.     Pulses:          Radial pulses are 2+ on the right side and 2+ on the left side.       Posterior tibial pulses are 2+ on the right side and 2+ on the left side.     Heart sounds:  Normal heart sounds. No murmur.  Pulmonary:     Effort: Pulmonary effort is normal. No respiratory distress.     Breath sounds: Normal breath sounds. No wheezing, rhonchi or rales.  Abdominal:     General: Bowel sounds are normal. There is no distension.     Palpations: Abdomen is soft. Abdomen is not rigid.     Tenderness: There is abdominal tenderness (mild TTP around umbilical wound). There is no guarding.       Comments: Open wound just below umbilicus with penrose in place with surrounding erythema, see photo below. No purulent drainage noted  Musculoskeletal: Normal range of motion.        General: No deformity.  Lymphadenopathy:     Cervical: No cervical adenopathy.  Skin:    General: Skin is warm and dry.  Neurological:     Mental Status: She is alert and oriented to person, place, and time.  Psychiatric:        Behavior: Behavior normal.        Results for orders placed or performed during the hospital encounter of 06/08/18 (from the past 48 hour(s))  Comprehensive metabolic panel     Status: Abnormal   Collection Time: 06/08/18  2:42 PM  Result Value Ref Range   Sodium 140 135 - 145 mmol/L   Potassium 4.1 3.5 - 5.1 mmol/L   Chloride 106 98 - 111 mmol/L   CO2 22 22 - 32 mmol/L   Glucose, Bld 100 (H) 70 - 99 mg/dL   BUN 17 6 - 20 mg/dL   Creatinine, Ser 0.64 0.44 - 1.00 mg/dL   Calcium 8.6 (L) 8.9 - 10.3 mg/dL   Total Protein 7.6 6.5 - 8.1 g/dL   Albumin 3.8 3.5 - 5.0 g/dL   AST 141 (H) 15 - 41 U/L   ALT 137 (H) 0 - 44 U/L   Alkaline Phosphatase 157 (H) 38 - 126 U/L   Total Bilirubin 0.6 0.3 - 1.2 mg/dL   GFR calc non Af Amer >60 >60 mL/min   GFR calc Af Amer >60 >60 mL/min   Anion gap 12 5 - 15    Comment: Performed at [facility], [address] [place], [place] [address]  CBC with Differential     Status: Abnormal   Collection Time: 06/08/18  2:42 PM  Result Value Ref Range   WBC 8.7 4.0 - 10.5 K/uL   RBC 6.49 (H) 3.87 - 5.11 MIL/uL    Hemoglobin 14.7 12.0 - 15.0 g/dL   HCT 47.3 (H) 36.0 - 46.0 %  MCV 72.9 (L) 80.0 - 100.0 fL   MCH 22.7 (L) 26.0 - 34.0 pg   MCHC 31.1 30.0 - 36.0 g/dL   RDW 17.2 (H) 11.5 - 15.5 %   Platelets 714 (H) 150 - 400 K/uL   nRBC 0.0 0.0 - 0.2 %   Neutrophils Relative % 57 %   Neutro Abs 5.0 1.7 - 7.7 K/uL   Lymphocytes Relative 20 %   Lymphs Abs 1.7 0.7 - 4.0 K/uL   Monocytes Relative 11 %   Monocytes Absolute 1.0 0.1 - 1.0 K/uL   Eosinophils Relative 9 %   Eosinophils Absolute 0.8 (H) 0.0 - 0.5 K/uL   Basophils Relative 2 %   Basophils Absolute 0.2 (H) 0.0 - 0.1 K/uL   Immature Granulocytes 1 %   Abs Immature Granulocytes 0.06 0.00 - 0.07 K/uL    Comment: Performed at [facility] [address] [place], [place] [address]   No results found.    Assessment/Plan Active Problems:   * No active hospital problems. *   Abdominal wound - recommend diflucan and wound care, looks like yeast around the wound  Chest pain/tightness - recommend workup per EDP  She can follow up with Dr. Georgette Dover in our office.   Kalman Drape, [facility] 06/08/2018, 3:47 PM Pager: [phone] Consults: [phone] Mon-Fri 7:00 am-4:30 pm Sat-Sun 7:00 am-11:30 am

## 2018-06-08 NOTE — ED Provider Notes (Signed)
[facility] EMERGENCY DEPARTMENT Provider Note   CSN: [id number] Arrival date & time: 06/08/18  1414     History   Chief Complaint Chief Complaint  Patient presents with  . Abdominal Pain    HPI Connie Myers is a 57 y.o. female.  HPI Presents with concern of epigastric pain. Patient has multiple medical issues, most notably ventral hernia repair earlier this year, with prolonged recovery, including nonhealing wound. She notes that as recently as 3 days ago she saw her surgeon, had opening of prior nonhealing section with placement of a JP drain to facilitate healing. She notes that since that time she has developed some redness around the site, but no fever, no vomiting, no other abdominal pain purulent pain is focally in this area However, today, about 1 hour prior to ED arrival she developed epigastric pain, radiating underneath her ribs bilaterally. There is some associated lightheadedness, some dyspnea with pain, but no other chest pain, no fever, no vomiting. Patient has been taking oxycodone and ibuprofen for pain relief.  She is here with her daughter and her husband who assists with the HPI.   Past Medical History:  Diagnosis Date  . Anemia   . Arthritis   . Complication of anesthesia    " I don't tolerate the caine's (Novacaine, Lidocaine etc. well "  . GERD (gastroesophageal reflux disease)   . Headache   . Sleep apnea    PMH: " I don't have it now "  . Ventral hernia   . Wears glasses     Patient Active Problem List   Diagnosis Date Noted  . S/P repair of ventral hernia 12/05/2017    Past Surgical History:  Procedure Laterality Date  . CESAREAN SECTION    . CHOLECYSTECTOMY    . COLONOSCOPY    . HERNIA REPAIR    . INSERTION OF MESH N/A 12/05/2017   Procedure: INSERTION OF MESH;  Surgeon: Donnie Mesa, MD;  Location: [place] [facility];  Service: General;  Laterality: N/A;  . VENTRAL HERNIA REPAIR  12/05/2017   OPEN VENTRAL HERNIA  REPAIR ERAS PATHWAY w/mesh  . VENTRAL HERNIA REPAIR N/A 12/05/2017   Procedure: OPEN VENTRAL HERNIA REPAIR ERAS PATHWAY;  Surgeon: Donnie Mesa, MD;  Location: [facility];  Service: General;  Laterality: N/A;  . WISDOM TOOTH EXTRACTION    . WOUND EXPLORATION N/A 12/29/2017   Procedure: ABDOMINAL WOUND EXPLORATION;  Surgeon: Donnie Mesa, MD;  Location: [place];  Service: General;  Laterality: N/A;  LMA     OB History   No obstetric history on file.      Home Medications    Prior to Admission medications   Medication Sig Start Date End Date Taking? Authorizing Provider  aspirin 81 MG chewable tablet Chew by mouth daily.    [provider]  diphenhydrAMINE (BENADRYL) 25 MG tablet Take 25 mg by mouth every 6 (six) hours as needed.    [provider]  famotidine (PEPCID) 20 MG tablet Take 1 tablet (20 mg total) by mouth 2 (two) times daily. 06/08/18   Carmin Muskrat, MD  fexofenadine (ALLEGRA) 180 MG tablet Take 180 mg by mouth daily as needed for allergies.    [provider]  gabapentin (NEURONTIN) 100 MG capsule Take 1 capsule (100 mg total) by mouth 3 (three) times daily for 15 days. 06/08/18 06/23/18  Carmin Muskrat, MD  oxyCODONE (OXY IR/ROXICODONE) 5 MG immediate release tablet Take 1 tablet (5 mg total) by mouth every 6 (  six) hours as needed for moderate pain. 12/07/17   Donnie Mesa, MD  oxyCODONE (ROXICODONE) 5 MG immediate release tablet Take 1 tablet (5 mg total) by mouth every 6 (six) hours as needed. 12/29/17   Saverio Danker, PA-C    Family History Family History  Problem Relation Age of Onset  . Breast cancer Mother     Social History Social History   Tobacco Use  . Smoking status: Never Smoker  . Smokeless tobacco: Never Used  Substance Use Topics  . Alcohol use: Yes    Comment: occasionally  . Drug use: Not Currently     Allergies   Codeine and Local [lidocaine]   Review of Systems Review of Systems  Constitutional:       Per  HPI, otherwise negative  HENT:       Per HPI, otherwise negative  Respiratory:       Per HPI, otherwise negative  Cardiovascular:       Per HPI, otherwise negative  Gastrointestinal: Negative for vomiting.  Endocrine:       Negative aside from HPI  Genitourinary:       Neg aside from HPI   Musculoskeletal:       Per HPI, otherwise negative  Skin: Positive for wound.  Neurological: Positive for light-headedness. Negative for syncope.     Physical Exam Updated Vital Signs BP (!) 166/98   Pulse 66   Temp 97.8 F (36.6 C) (Oral)   Resp 16   SpO2 96%   Physical Exam Vitals signs and nursing note reviewed.  Constitutional:      General: She is not in acute distress.    Appearance: She is well-developed.  HENT:     Head: Normocephalic and atraumatic.  Eyes:     Conjunctiva/sclera: Conjunctivae normal.  Cardiovascular:     Rate and Rhythm: Normal rate and regular rhythm.  Pulmonary:     Effort: Pulmonary effort is normal. No respiratory distress.     Breath sounds: Normal breath sounds. No stridor.  Abdominal:     General: There is no distension.    Skin:    General: Skin is warm and dry.  Neurological:     Mental Status: She is alert and oriented to person, place, and time.     Cranial Nerves: No cranial nerve deficit.      ED Treatments / Results  Labs (all labs ordered are listed, but only abnormal results are displayed) Labs Reviewed  COMPREHENSIVE METABOLIC PANEL - Abnormal; Notable for the following components:      Result Value   Glucose, Bld 100 (*)    Calcium 8.6 (*)    AST 141 (*)    ALT 137 (*)    Alkaline Phosphatase 157 (*)    All other components within normal limits  CBC WITH DIFFERENTIAL/PLATELET - Abnormal; Notable for the following components:   RBC 6.49 (*)    HCT 47.3 (*)    MCV 72.9 (*)    MCH 22.7 (*)    RDW 17.2 (*)    Platelets 714 (*)    Eosinophils Absolute 0.8 (*)    Basophils Absolute 0.2 (*)    All other components  within normal limits  TROPONIN I    EKG EKG Interpretation  Date/Time:  Friday June 08 2018 14:25:49 EST Ventricular Rate:  60 PR Interval:    QRS Duration: 117 QT Interval:  474 QTC Calculation: 474 R Axis:   9 Text Interpretation:  Sinus rhythm Nonspecific intraventricular  conduction delay Abnormal ekg Confirmed by Carmin Muskrat [phone]) on 06/08/2018 4:06:42 PM   Radiology No results found.  Procedures Procedures (including critical care time)  Medications Ordered in ED Medications  ketorolac (TORADOL) 15 MG/ML injection 15 mg (15 mg Intravenous Given 06/08/18 1556)  alum & mag hydroxide-simeth (MAALOX/MYLANTA) 200-200-20 MG/5ML suspension 30 mL (30 mLs Oral Given 06/08/18 1703)  famotidine (PEPCID) IVPB 20 mg premix (20 mg Intravenous New Bag/Given 06/08/18 1702)  nystatin (MYCOSTATIN/NYSTOP) topical powder ( Topical Given 06/08/18 1703)     Initial Impression / Assessment and Plan / ED Course  I have reviewed the triage vital signs and the nursing notes.  Pertinent labs & imaging results that were available during my care of the patient were reviewed by me and considered in my medical decision making (see chart for details).    After initial evaluation I read the patient's case with her surgical team, and she has been seen and evaluated by the physician assistant and surgeon.  Update:, Patient remains awake, alert, sitting upright, using a cellular telephone, in no distress. We discussed findings thus far including reassuring labs, no leukocytosis, mild abnormalities only. Patient continues to exhibit no evidence for distress, has mild hypertension, is otherwise hemodynamically unremarkable. 6:43 PM Patient and family members aware of all findings, consultation suggestion, we lengthy conversation about pain control, topical yeast medication, following up with her surgeon, suspicion for epigastric discomfort, possibly gastric irritation contributing to today's  episode. No evidence for ACS, pneumonia, no hypoxia suggesting PE, no evidence for bacteremia, sepsis with no fever, no leukocytosis. Patient will start a regimen of gabapentin, nystatin, Pepcid, will follow-up with outpatient providers.  Final Clinical Impressions(s) / ED Diagnoses   Final diagnoses:  Epigastric pain  Open wound    ED Discharge Orders         Ordered    famotidine (PEPCID) 20 MG tablet  2 times daily     06/08/18 1839    gabapentin (NEURONTIN) 100 MG capsule  3 times daily     06/08/18 1839           Carmin Muskrat, MD 06/08/18 1843

## 2018-06-08 NOTE — ED Notes (Signed)
Patient verbalizes understanding of discharge instructions. Opportunity for questioning and answers were provided. Armband removed by staff, pt discharged from ED.  

## 2018-06-08 NOTE — ED Notes (Signed)
Pt took her home pain medication. 1x 5-325, per pill identification by this RN. No prescription bottle present.

## 2018-06-08 NOTE — Discharge Instructions (Signed)
As discussed, your evaluation today has been largely reassuring.  But, it is important that you monitor your condition carefully, and do not hesitate to return to the ED if you develop new, or concerning changes in your condition. ? ?Otherwise, please follow-up with your physician for appropriate ongoing care. ? ?

## 2018-06-08 NOTE — ED Triage Notes (Addendum)
Pt arrived via [facility] from work c/o epigastric pain that started this morning. Pt stated her pain moved from her abd to chest and endorses dizziness and SOB. Pt alert and oriented x4. Tearful at time of triage. Pt stated she had abd surgery in June and wound has not healed well. EMS v/s 173/93, hr72, rr18, 98%ra, cbg 148. EMS TX: 324mg  ASA PTA.

## 2018-08-15 ENCOUNTER — Ambulatory Visit: Payer: Self-pay | Admitting: Surgery

## 2018-10-19 ENCOUNTER — Ambulatory Visit: Payer: Self-pay | Admitting: Surgery

## 2018-12-27 NOTE — Progress Notes (Signed)
[place], [place] - [address] [place] [place] [address] Phone: [phone] Fax: [phone]      Your procedure is scheduled on July 14th.  Report to [facility] Main Entrance "A" at 5:30 A.M., and check in at the Admitting office.  Call this number if you have problems the morning of surgery:  [phone]  Call [phone] if you have any questions prior to your surgery date Monday-Friday 8am-4pm    Remember:  Do not eat or drink after midnight the night before your surgery     Take these medicines the morning of surgery with A SIP OF WATER   Allegra   Gabapentin (Neurontin)  Follow your surgeon's instructions on when to stop Aspirin.  If no instructions were given by your surgeon then you will need to call the office to get those instructions.    7 days prior to surgery STOP taking any Aspirin (unless otherwise instructed by your surgeon), Aleve, Naproxen, Ibuprofen, Motrin, Advil, Goody's, BC's, all herbal medications, fish oil, and all vitamins.    The Morning of Surgery  Do not wear jewelry, make-up or nail polish.  Do not wear lotions, powders, perfumes, or deodorant  Do not shave 48 hours prior to surgery.   Do not bring valuables to the hospital.  [facility] is not responsible for any belongings or valuables.  If you are a smoker, DO NOT Smoke 24 hours prior to surgery IF you wear a CPAP at night please bring your mask, tubing, and machine the morning of surgery   Remember that you must have someone to transport you home after your surgery, and remain with you for 24 hours if you are discharged the same day.   Contacts, glasses, hearing aids, dentures or bridgework may not be worn into surgery.    Leave your suitcase in the car.  After surgery it may be brought to your room.  For patients admitted to the hospital, discharge time will be determined by your treatment team.  Patients discharged the day  of surgery will not be allowed to drive home.    Special instructions:   [place]- Preparing For Surgery  Before surgery, you can play an important role. Because skin is not sterile, your skin needs to be as free of germs as possible. You can reduce the number of germs on your skin by washing with CHG (chlorahexidine gluconate) Soap before surgery.  CHG is an antiseptic cleaner which kills germs and bonds with the skin to continue killing germs even after washing.    Oral Hygiene is also important to reduce your risk of infection.  Remember - BRUSH YOUR TEETH THE MORNING OF SURGERY WITH YOUR REGULAR TOOTHPASTE  Please do not use if you have an allergy to CHG or antibacterial soaps. If your skin becomes reddened/irritated stop using the CHG.  Do not shave (including legs and underarms) for at least 48 hours prior to first CHG shower. It is OK to shave your face.  Please follow these instructions carefully.   1. Shower the NIGHT BEFORE SURGERY and the MORNING OF SURGERY with CHG Soap.   2. If you chose to wash your hair, wash your hair first as usual with your normal shampoo.  3. After you shampoo, rinse your hair and body thoroughly to remove the shampoo.  4. Use CHG as you would any other liquid soap. You can apply CHG directly to the skin and wash gently with a scrungie  or a clean washcloth.   5. Apply the CHG Soap to your body ONLY FROM THE NECK DOWN.  Do not use on open wounds or open sores. Avoid contact with your eyes, ears, mouth and genitals (private parts). Wash Face and genitals (private parts)  with your normal soap.   6. Wash thoroughly, paying special attention to the area where your surgery will be performed.  7. Thoroughly rinse your body with warm water from the neck down.  8. DO NOT shower/wash with your normal soap after using and rinsing off the CHG Soap.  9. Pat yourself dry with a CLEAN TOWEL.  10. Wear CLEAN PAJAMAS to bed the night before surgery, wear  comfortable clothes the morning of surgery  11. Place CLEAN SHEETS on your bed the night of your first shower and DO NOT SLEEP WITH PETS.   Day of Surgery:  Do not apply any deodorants/lotions. Please shower the morning of surgery with the CHG soap  Please wear clean clothes to the hospital/surgery center.   Remember to brush your teeth WITH YOUR REGULAR TOOTHPASTE.   Please read over the following fact sheets that you were given.

## 2018-12-28 ENCOUNTER — Other Ambulatory Visit (HOSPITAL_COMMUNITY)
Admission: RE | Admit: 2018-12-28 | Discharge: 2018-12-28 | Disposition: A | Discharge status: Home / Self Care | Payer: No Typology Code available for payment source | Source: Ambulatory Visit | Attending: Surgery | Admitting: Surgery

## 2018-12-28 ENCOUNTER — Other Ambulatory Visit: Payer: Self-pay

## 2018-12-28 ENCOUNTER — Encounter (HOSPITAL_COMMUNITY)
Admission: RE | Admit: 2018-12-28 | Discharge: 2018-12-28 | Disposition: A | Discharge status: Home / Self Care | Payer: No Typology Code available for payment source | Source: Ambulatory Visit | Attending: Surgery | Admitting: Surgery

## 2018-12-28 ENCOUNTER — Encounter (HOSPITAL_COMMUNITY): Payer: Self-pay

## 2018-12-28 DIAGNOSIS — Z1159 Encounter for screening for other viral diseases: Secondary | ICD-10-CM | POA: Diagnosis not present

## 2018-12-28 DIAGNOSIS — Z01812 Encounter for preprocedural laboratory examination: Secondary | ICD-10-CM | POA: Diagnosis present

## 2018-12-28 LAB — CBC
HCT: 53.2 % — ABNORMAL HIGH (ref 36.0–46.0)
Hemoglobin: 16.3 g/dL — ABNORMAL HIGH (ref 12.0–15.0)
MCH: 20.8 pg — ABNORMAL LOW (ref 26.0–34.0)
MCHC: 30.6 g/dL (ref 30.0–36.0)
MCV: 67.8 fL — ABNORMAL LOW (ref 80.0–100.0)
Platelets: 982 10*3/uL (ref 150–400)
RBC: 7.85 MIL/uL — ABNORMAL HIGH (ref 3.87–5.11)
RDW: 19.8 % — ABNORMAL HIGH (ref 11.5–15.5)
WBC: 10.9 10*3/uL — ABNORMAL HIGH (ref 4.0–10.5)
nRBC: 0 % (ref 0.0–0.2)

## 2018-12-28 LAB — BASIC METABOLIC PANEL
Anion gap: 9 (ref 5–15)
BUN: 13 mg/dL (ref 6–20)
CO2: 24 mmol/L (ref 22–32)
Calcium: 8.7 mg/dL — ABNORMAL LOW (ref 8.9–10.3)
Chloride: 106 mmol/L (ref 98–111)
Creatinine, Ser: 0.66 mg/dL (ref 0.44–1.00)
Glucose, Bld: 91 mg/dL (ref 70–99)
Potassium: 4.1 mmol/L (ref 3.5–5.1)
Sodium: 139 mmol/L (ref 135–145)

## 2018-12-28 NOTE — Progress Notes (Signed)
PCP - Dr. Jillyn Ledger  Cardiologist -denies   Chest x-ray - N/A EKG - 06/08/18 Stress Test - 5+ years ago ECHO - 5+ years ago Cardiac Cath - denies  Sleep Study - OSA+ years ago, pt states that is no longer an issue. CPAP - has not used CPAP in over 5 years.   Blood Thinner Instructions: N/A Aspirin Instructions: Hold for procedure.   Anesthesia review: No  Patient denies shortness of breath, fever, cough and chest pain at PAT appointment   Patient verbalized understanding of instructions that were given to them at the PAT appointment. Patient was also instructed that they will need to review over the PAT instructions again at home before surgery.  Pt scheduled for Covid testing after PAT appointment. Pt aware of quarantining after testing until surgery.    Coronavirus Screening  Have you experienced the following symptoms:  Cough yes/no: No Fever (>100.9F)  yes/no: No Runny nose yes/no: No Sore throat yes/no: No Difficulty breathing/shortness of breath  yes/no: No  Have you or a family member traveled in the last 14 days and where? yes/no: No   If the patient indicates "YES" to the above questions, their PAT will be rescheduled to limit the exposure to others and, the surgeon will be notified. THE PATIENT WILL NEED TO BE ASYMPTOMATIC FOR 14 DAYS.   If the patient is not experiencing any of these symptoms, the PAT nurse will instruct them to NOT bring anyone with them to their appointment since they may have these symptoms or traveled as well.   Please remind your patients and families that hospital visitation restrictions are in effect and the importance of the restrictions.

## 2018-12-28 NOTE — Progress Notes (Signed)
CRITICAL VALUE ALERT  Critical Value:  Platelets 982  Date & Time Notied:  12/28/18 1600  Provider Notified: Notified [facility]. Spoke with triage RN, Abigail Butts. Dr. Georgette Dover was unpageable for the afternoon but Abigail Butts will make him aware as soon as possible.   Orders Received/Actions taken: None at this time.

## 2018-12-29 LAB — SARS CORONAVIRUS 2 (TAT 6-24 HRS): SARS Coronavirus 2: NEGATIVE

## 2018-12-31 NOTE — Anesthesia Preprocedure Evaluation (Addendum)
Anesthesia Evaluation  Patient identified by MRN, date of birth, ID band Patient awake    Reviewed: Allergy & Precautions, H&P , NPO status , Patient's Chart, lab work & pertinent test results  Airway Mallampati: II  TM Distance: >3 FB Neck ROM: Full    Dental no notable dental hx. (+) Teeth Intact, Dental Advisory Given   Pulmonary neg pulmonary ROS,    Pulmonary exam normal breath sounds clear to auscultation       Cardiovascular Exercise Tolerance: Good negative cardio ROS   Rhythm:Regular Rate:Normal     Neuro/Psych  Headaches, negative psych ROS   GI/Hepatic negative GI ROS, Neg liver ROS,   Endo/Other  negative endocrine ROS  Renal/GU negative Renal ROS  negative genitourinary   Musculoskeletal  (+) Arthritis , Osteoarthritis,    Abdominal   Peds  Hematology negative hematology ROS (+)   Anesthesia Other Findings   Reproductive/Obstetrics negative OB ROS                            Anesthesia Physical Anesthesia Plan  ASA: II  Anesthesia Plan: General   Post-op Pain Management:    Induction: Intravenous  PONV Risk Score and Plan: 4 or greater and Ondansetron, Dexamethasone and Midazolam  Airway Management Planned: Oral ETT  Additional Equipment:   Intra-op Plan:   Post-operative Plan: Extubation in OR  Informed Consent: I have reviewed the patients History and Physical, chart, labs and discussed the procedure including the risks, benefits and alternatives for the proposed anesthesia with the patient or authorized representative who has indicated his/her understanding and acceptance.     Dental advisory given  Plan Discussed with: CRNA  Anesthesia Plan Comments: (History of significant thrombocytosis of unclear etiology. No discussion of thrombocytosis found in Epic. Pt's PCP listed as Jillyn Ledger, NP. Requested records however pt has only been seen there once in  2018 for influenza, no discussion of platelets. Spoke with patient, says she was aware of lab abnormality with her last surgery but says she was never advised to have it worked up. She was unaware her platelets had been elevated consistently. I advised her to establish with PCP for further workup and she said she would. Advised her to discuss with Dr. Georgette Dover when to restart 81mg  ASA postop (she says she is not sure why she was prescribed it initially). Discussed case with Dr. Ermalene Postin. He advised that given pt's apparently stable history of elevated platelets, okay to proceed as planned with understanding she is likely at higher clotting risk. Dr. Vonna Kotyk office has been notified as well.   Platelets (K/uL)      Date                     Value                12/28/2018               982 ([place])             06/08/2018               714 ([place])              12/29/2017               1,074 ([place])           12/06/2017               655 ([facility])            )  Anesthesia Quick Evaluation  

## 2019-01-01 ENCOUNTER — Ambulatory Visit (HOSPITAL_COMMUNITY): Payer: No Typology Code available for payment source | Admitting: Physician Assistant

## 2019-01-01 ENCOUNTER — Encounter (HOSPITAL_COMMUNITY)
Admission: RE | Disposition: A | Discharge status: Home / Self Care | Payer: Self-pay | Source: Home / Self Care | Attending: Surgery

## 2019-01-01 ENCOUNTER — Encounter (HOSPITAL_COMMUNITY): Payer: Self-pay | Admitting: Certified Registered Nurse Anesthetist

## 2019-01-01 ENCOUNTER — Ambulatory Visit (HOSPITAL_COMMUNITY): Payer: No Typology Code available for payment source | Admitting: Certified Registered Nurse Anesthetist

## 2019-01-01 ENCOUNTER — Other Ambulatory Visit: Payer: Self-pay

## 2019-01-01 ENCOUNTER — Observation Stay (HOSPITAL_COMMUNITY)
Admission: RE | Admit: 2019-01-01 | Discharge: 2019-01-02 | Disposition: A | Discharge status: Home / Self Care | Payer: No Typology Code available for payment source | Attending: Surgery | Admitting: Surgery

## 2019-01-01 DIAGNOSIS — Z885 Allergy status to narcotic agent status: Secondary | ICD-10-CM | POA: Insufficient documentation

## 2019-01-01 DIAGNOSIS — M1990 Unspecified osteoarthritis, unspecified site: Secondary | ICD-10-CM | POA: Insufficient documentation

## 2019-01-01 DIAGNOSIS — K219 Gastro-esophageal reflux disease without esophagitis: Secondary | ICD-10-CM | POA: Insufficient documentation

## 2019-01-01 DIAGNOSIS — Z803 Family history of malignant neoplasm of breast: Secondary | ICD-10-CM | POA: Diagnosis not present

## 2019-01-01 DIAGNOSIS — D649 Anemia, unspecified: Secondary | ICD-10-CM | POA: Insufficient documentation

## 2019-01-01 DIAGNOSIS — Z79899 Other long term (current) drug therapy: Secondary | ICD-10-CM | POA: Insufficient documentation

## 2019-01-01 DIAGNOSIS — G4730 Sleep apnea, unspecified: Secondary | ICD-10-CM | POA: Insufficient documentation

## 2019-01-01 DIAGNOSIS — K432 Incisional hernia without obstruction or gangrene: Secondary | ICD-10-CM | POA: Diagnosis not present

## 2019-01-01 DIAGNOSIS — Z884 Allergy status to anesthetic agent status: Secondary | ICD-10-CM | POA: Diagnosis not present

## 2019-01-01 DIAGNOSIS — Z9049 Acquired absence of other specified parts of digestive tract: Secondary | ICD-10-CM | POA: Insufficient documentation

## 2019-01-01 DIAGNOSIS — K660 Peritoneal adhesions (postprocedural) (postinfection): Secondary | ICD-10-CM | POA: Insufficient documentation

## 2019-01-01 DIAGNOSIS — Z7982 Long term (current) use of aspirin: Secondary | ICD-10-CM | POA: Diagnosis not present

## 2019-01-01 HISTORY — PX: VENTRAL HERNIA REPAIR: SHX424

## 2019-01-01 LAB — CBC
HCT: 51.9 % — ABNORMAL HIGH (ref 36.0–46.0)
Hemoglobin: 15.6 g/dL — ABNORMAL HIGH (ref 12.0–15.0)
MCH: 20.5 pg — ABNORMAL LOW (ref 26.0–34.0)
MCHC: 30.1 g/dL (ref 30.0–36.0)
MCV: 68.1 fL — ABNORMAL LOW (ref 80.0–100.0)
Platelets: 949 10*3/uL (ref 150–400)
RBC: 7.62 MIL/uL — ABNORMAL HIGH (ref 3.87–5.11)
RDW: 19.7 % — ABNORMAL HIGH (ref 11.5–15.5)
WBC: 12.1 10*3/uL — ABNORMAL HIGH (ref 4.0–10.5)
nRBC: 0 % (ref 0.0–0.2)

## 2019-01-01 LAB — CREATININE, SERUM
Creatinine, Ser: 0.69 mg/dL (ref 0.44–1.00)
GFR calc Af Amer: >60 mL/min (ref >60)
GFR calc non Af Amer: >60 mL/min (ref >60)

## 2019-01-01 SURGERY — REPAIR, HERNIA, VENTRAL, LAPAROSCOPIC
Anesthesia: General | Site: Abdomen

## 2019-01-01 MED ORDER — MIDAZOLAM HCL 2 MG/2ML IJ SOLN
INTRAMUSCULAR | Status: AC
  Filled 2019-01-01: qty 2

## 2019-01-01 MED ORDER — DIPHENHYDRAMINE HCL 50 MG/ML IJ SOLN: 25.0000 mg | Freq: Four times a day (QID) | INTRAMUSCULAR | Status: DC | PRN

## 2019-01-01 MED ORDER — KETOROLAC TROMETHAMINE 30 MG/ML IJ SOLN: 30.0000 mg | Freq: Four times a day (QID) | INTRAMUSCULAR | Status: DC

## 2019-01-01 MED ORDER — GABAPENTIN 100 MG PO CAPS
100.0000 mg | ORAL_CAPSULE | Freq: Three times a day (TID) | ORAL | Status: DC
  Administered 2019-01-01 – 2019-01-02 (×3): 100 mg via ORAL
  Filled 2019-01-01 (×3): qty 1

## 2019-01-01 MED ORDER — METHOCARBAMOL 500 MG PO TABS
ORAL_TABLET | ORAL | Status: AC
  Filled 2019-01-01: qty 1

## 2019-01-01 MED ORDER — ONDANSETRON HCL 4 MG/2ML IJ SOLN
INTRAMUSCULAR | Status: DC | PRN
  Administered 2019-01-01: 4 mg via INTRAVENOUS

## 2019-01-01 MED ORDER — FENTANYL CITRATE (PF) 250 MCG/5ML IJ SOLN
INTRAMUSCULAR | Status: DC | PRN
  Administered 2019-01-01 (×3): 50 ug via INTRAVENOUS
  Administered 2019-01-01: 100 ug via INTRAVENOUS

## 2019-01-01 MED ORDER — MIDAZOLAM HCL 5 MG/5ML IJ SOLN
INTRAMUSCULAR | Status: DC | PRN
  Administered 2019-01-01: 2 mg via INTRAVENOUS

## 2019-01-01 MED ORDER — 0.9 % SODIUM CHLORIDE (POUR BTL) OPTIME
TOPICAL | Status: DC | PRN
  Administered 2019-01-01: 1000 mL

## 2019-01-01 MED ORDER — CHLORHEXIDINE GLUCONATE CLOTH 2 % EX PADS: 6.0000 | MEDICATED_PAD | Freq: Once | CUTANEOUS | Status: DC

## 2019-01-01 MED ORDER — BUPIVACAINE-EPINEPHRINE 0.25% -1:200000 IJ SOLN
INTRAMUSCULAR | Status: DC | PRN
  Administered 2019-01-01: 5 mL

## 2019-01-01 MED ORDER — KETOROLAC TROMETHAMINE 30 MG/ML IJ SOLN
INTRAMUSCULAR | Status: AC
  Filled 2019-01-01: qty 1

## 2019-01-01 MED ORDER — HYDROMORPHONE HCL 1 MG/ML IJ SOLN
1.0000 mg | INTRAMUSCULAR | Status: DC | PRN
  Administered 2019-01-01 (×3): 1 mg via INTRAVENOUS
  Filled 2019-01-01 (×3): qty 1

## 2019-01-01 MED ORDER — LIDOCAINE 2% (20 MG/ML) 5 ML SYRINGE
INTRAMUSCULAR | Status: AC
  Filled 2019-01-01: qty 5

## 2019-01-01 MED ORDER — CEFAZOLIN SODIUM-DEXTROSE 2-4 GM/100ML-% IV SOLN
2.0000 g | Freq: Three times a day (TID) | INTRAVENOUS | Status: AC
  Administered 2019-01-01: 2 g via INTRAVENOUS
  Filled 2019-01-01 (×2): qty 100

## 2019-01-01 MED ORDER — ACETAMINOPHEN 325 MG PO TABS: 650.0000 mg | ORAL_TABLET | Freq: Four times a day (QID) | ORAL | Status: DC | PRN

## 2019-01-01 MED ORDER — SUGAMMADEX SODIUM 200 MG/2ML IV SOLN
INTRAVENOUS | Status: DC | PRN
  Administered 2019-01-01: 225 mg via INTRAVENOUS

## 2019-01-01 MED ORDER — OXYCODONE HCL 5 MG PO TABS
ORAL_TABLET | ORAL | Status: AC
  Filled 2019-01-01: qty 2

## 2019-01-01 MED ORDER — ROCURONIUM BROMIDE 10 MG/ML (PF) SYRINGE
PREFILLED_SYRINGE | INTRAVENOUS | Status: AC
  Filled 2019-01-01: qty 10

## 2019-01-01 MED ORDER — ENOXAPARIN SODIUM 40 MG/0.4ML ~~LOC~~ SOLN
40.0000 mg | SUBCUTANEOUS | Status: DC
  Administered 2019-01-02: 40 mg via SUBCUTANEOUS
  Filled 2019-01-01: qty 0.4

## 2019-01-01 MED ORDER — BUPIVACAINE-EPINEPHRINE (PF) 0.25% -1:200000 IJ SOLN
INTRAMUSCULAR | Status: AC
  Filled 2019-01-01: qty 30

## 2019-01-01 MED ORDER — ROCURONIUM BROMIDE 10 MG/ML (PF) SYRINGE
PREFILLED_SYRINGE | INTRAVENOUS | Status: DC | PRN
  Administered 2019-01-01: 20 mg via INTRAVENOUS
  Administered 2019-01-01: 50 mg via INTRAVENOUS
  Administered 2019-01-01: 10 mg via INTRAVENOUS

## 2019-01-01 MED ORDER — POTASSIUM CHLORIDE IN NACL 20-0.9 MEQ/L-% IV SOLN
INTRAVENOUS | Status: DC
  Administered 2019-01-01 – 2019-01-02 (×2): via INTRAVENOUS
  Filled 2019-01-01 (×2): qty 1000

## 2019-01-01 MED ORDER — ACETAMINOPHEN 650 MG RE SUPP: 650.0000 mg | Freq: Four times a day (QID) | RECTAL | Status: DC | PRN

## 2019-01-01 MED ORDER — ONDANSETRON HCL 4 MG/2ML IJ SOLN
4.0000 mg | Freq: Four times a day (QID) | INTRAMUSCULAR | Status: DC | PRN
  Administered 2019-01-01: 4 mg via INTRAVENOUS
  Filled 2019-01-01: qty 2

## 2019-01-01 MED ORDER — ACETAMINOPHEN 500 MG PO TABS: 1000.0000 mg | ORAL_TABLET | Freq: Once | ORAL | Status: DC

## 2019-01-01 MED ORDER — OXYCODONE HCL 5 MG PO TABS
5.0000 mg | ORAL_TABLET | ORAL | Status: DC | PRN
  Administered 2019-01-01: 10 mg via ORAL

## 2019-01-01 MED ORDER — DEXAMETHASONE SODIUM PHOSPHATE 10 MG/ML IJ SOLN
INTRAMUSCULAR | Status: DC | PRN
  Administered 2019-01-01: 5 mg via INTRAVENOUS

## 2019-01-01 MED ORDER — FENTANYL CITRATE (PF) 250 MCG/5ML IJ SOLN
INTRAMUSCULAR | Status: AC
  Filled 2019-01-01: qty 5

## 2019-01-01 MED ORDER — LORATADINE 10 MG PO TABS
10.0000 mg | ORAL_TABLET | Freq: Every day | ORAL | Status: DC
  Filled 2019-01-01 (×2): qty 1

## 2019-01-01 MED ORDER — DIPHENHYDRAMINE HCL 25 MG PO CAPS: 25.0000 mg | ORAL_CAPSULE | Freq: Four times a day (QID) | ORAL | Status: DC | PRN

## 2019-01-01 MED ORDER — HYDROMORPHONE HCL 1 MG/ML IJ SOLN
INTRAMUSCULAR | Status: AC
  Filled 2019-01-01: qty 1

## 2019-01-01 MED ORDER — LIDOCAINE 2% (20 MG/ML) 5 ML SYRINGE
INTRAMUSCULAR | Status: DC | PRN
  Administered 2019-01-01: 60 mg via INTRAVENOUS

## 2019-01-01 MED ORDER — PROPOFOL 10 MG/ML IV BOLUS
INTRAVENOUS | Status: AC
  Filled 2019-01-01: qty 40

## 2019-01-01 MED ORDER — ACETAMINOPHEN 500 MG PO TABS
ORAL_TABLET | ORAL | Status: AC
  Administered 2019-01-01: 1000 mg
  Filled 2019-01-01: qty 2

## 2019-01-01 MED ORDER — CEFAZOLIN SODIUM-DEXTROSE 2-4 GM/100ML-% IV SOLN
2.0000 g | INTRAVENOUS | Status: AC
  Administered 2019-01-01: 08:00:00 2 g via INTRAVENOUS

## 2019-01-01 MED ORDER — LACTATED RINGERS IV SOLN
INTRAVENOUS | Status: DC | PRN
  Administered 2019-01-01: 07:00:00 via INTRAVENOUS

## 2019-01-01 MED ORDER — KETOROLAC TROMETHAMINE 30 MG/ML IJ SOLN
INTRAMUSCULAR | Status: DC | PRN
  Administered 2019-01-01: 30 mg via INTRAVENOUS

## 2019-01-01 MED ORDER — CEFAZOLIN SODIUM-DEXTROSE 2-4 GM/100ML-% IV SOLN
INTRAVENOUS | Status: AC
  Filled 2019-01-01: qty 100

## 2019-01-01 MED ORDER — DOCUSATE SODIUM 100 MG PO CAPS
100.0000 mg | ORAL_CAPSULE | Freq: Two times a day (BID) | ORAL | Status: DC
  Administered 2019-01-01 – 2019-01-02 (×3): 100 mg via ORAL
  Filled 2019-01-01 (×3): qty 1

## 2019-01-01 MED ORDER — TRAMADOL HCL 50 MG PO TABS
50.0000 mg | ORAL_TABLET | Freq: Four times a day (QID) | ORAL | Status: DC | PRN
  Administered 2019-01-01 – 2019-01-02 (×3): 50 mg via ORAL
  Filled 2019-01-01 (×3): qty 1

## 2019-01-01 MED ORDER — DEXAMETHASONE SODIUM PHOSPHATE 10 MG/ML IJ SOLN
INTRAMUSCULAR | Status: AC
  Filled 2019-01-01: qty 1

## 2019-01-01 MED ORDER — PROPOFOL 10 MG/ML IV BOLUS
INTRAVENOUS | Status: DC | PRN
  Administered 2019-01-01: 130 mg via INTRAVENOUS

## 2019-01-01 MED ORDER — ONDANSETRON HCL 4 MG/2ML IJ SOLN
INTRAMUSCULAR | Status: AC
  Filled 2019-01-01: qty 2

## 2019-01-01 MED ORDER — IBUPROFEN 400 MG PO TABS
400.0000 mg | ORAL_TABLET | Freq: Four times a day (QID) | ORAL | Status: DC | PRN
  Administered 2019-01-01 – 2019-01-02 (×2): 400 mg via ORAL
  Filled 2019-01-01 (×2): qty 1

## 2019-01-01 MED ORDER — KETOROLAC TROMETHAMINE 30 MG/ML IJ SOLN: 30.0000 mg | Freq: Four times a day (QID) | INTRAMUSCULAR | Status: DC | PRN

## 2019-01-01 MED ORDER — HYDROMORPHONE HCL 1 MG/ML IJ SOLN
0.2500 mg | INTRAMUSCULAR | Status: DC | PRN
  Administered 2019-01-01 (×4): 0.5 mg via INTRAVENOUS

## 2019-01-01 MED ORDER — ONDANSETRON 4 MG PO TBDP: 4.0000 mg | ORAL_TABLET | Freq: Four times a day (QID) | ORAL | Status: DC | PRN

## 2019-01-01 MED ORDER — METHOCARBAMOL 500 MG PO TABS
500.0000 mg | ORAL_TABLET | Freq: Four times a day (QID) | ORAL | Status: DC | PRN
  Administered 2019-01-01: 500 mg via ORAL

## 2019-01-01 SURGICAL SUPPLY — 46 items
APPLIER CLIP 5 13 M/L LIGAMAX5 (MISCELLANEOUS)
BINDER ABDOMINAL 12 ML 46-62 (SOFTGOODS) IMPLANT
BINDER ABDOMINAL 12 XL 75-84 (SOFTGOODS) ×1 IMPLANT
BLADE CLIPPER SURG (BLADE) IMPLANT
CANISTER SUCT 3000ML PPV (MISCELLANEOUS) IMPLANT
CHLORAPREP W/TINT 26 (MISCELLANEOUS) ×2 IMPLANT
CLIP APPLIE 5 13 M/L LIGAMAX5 (MISCELLANEOUS) IMPLANT
COVER SURGICAL LIGHT HANDLE (MISCELLANEOUS) ×2 IMPLANT
COVER WAND RF STERILE (DRAPES) ×1 IMPLANT
DERMABOND ADVANCED (GAUZE/BANDAGES/DRESSINGS) ×1
DERMABOND ADVANCED .7 DNX12 (GAUZE/BANDAGES/DRESSINGS) ×1 IMPLANT
DEVICE RELIATACK FIXATION (MISCELLANEOUS) ×1 IMPLANT
DEVICE SECURE STRAP 25 ABSORB (INSTRUMENTS) ×2 IMPLANT
DEVICE TROCAR PUNCTURE CLOSURE (ENDOMECHANICALS) ×2 IMPLANT
ELECT REM PT RETURN 9FT ADLT (ELECTROSURGICAL) ×2
ELECTRODE REM PT RTRN 9FT ADLT (ELECTROSURGICAL) ×1 IMPLANT
FILTER SMOKE EVAC LAPAROSHD (FILTER) IMPLANT
GLOVE BIO SURGEON STRL SZ7 (GLOVE) ×2 IMPLANT
GLOVE BIOGEL PI IND STRL 7.5 (GLOVE) ×1 IMPLANT
GLOVE BIOGEL PI INDICATOR 7.5 (GLOVE) ×1
GLOVE SURG SS PI 7.5 STRL IVOR (GLOVE) ×1 IMPLANT
GOWN STRL REUS W/ TWL LRG LVL3 (GOWN DISPOSABLE) ×3 IMPLANT
GOWN STRL REUS W/TWL LRG LVL3 (GOWN DISPOSABLE) ×3
KIT BASIN OR (CUSTOM PROCEDURE TRAY) ×2 IMPLANT
KIT TURNOVER KIT B (KITS) ×2 IMPLANT
MARKER SKIN DUAL TIP RULER LAB (MISCELLANEOUS) ×2 IMPLANT
MESH VENTRALIGHT ST 7X9N (Mesh General) ×1 IMPLANT
NDL SPNL 22GX3.5 QUINCKE BK (NEEDLE) ×1 IMPLANT
NEEDLE SPNL 22GX3.5 QUINCKE BK (NEEDLE) ×2 IMPLANT
NS IRRIG 1000ML POUR BTL (IV SOLUTION) ×2 IMPLANT
PAD ARMBOARD 7.5X6 YLW CONV (MISCELLANEOUS) ×4 IMPLANT
SCISSORS LAP 5X35 DISP (ENDOMECHANICALS) ×1 IMPLANT
SET IRRIG TUBING LAPAROSCOPIC (IRRIGATION / IRRIGATOR) ×1 IMPLANT
SET TUBE SMOKE EVAC HIGH FLOW (TUBING) ×2 IMPLANT
SHEARS HARMONIC ACE PLUS 36CM (ENDOMECHANICALS) ×1 IMPLANT
SLEEVE ENDOPATH XCEL 5M (ENDOMECHANICALS) ×3 IMPLANT
SUT MNCRL AB 4-0 PS2 18 (SUTURE) ×2 IMPLANT
SUT NOVA NAB GS-21 0 18 T12 DT (SUTURE) ×3 IMPLANT
TOWEL GREEN STERILE (TOWEL DISPOSABLE) ×2 IMPLANT
TOWEL GREEN STERILE FF (TOWEL DISPOSABLE) ×2 IMPLANT
TRAY FOLEY MTR SLVR 14FR STAT (SET/KITS/TRAYS/PACK) ×1 IMPLANT
TRAY LAPAROSCOPIC MC (CUSTOM PROCEDURE TRAY) ×2 IMPLANT
TROCAR XCEL BLUNT TIP 100MML (ENDOMECHANICALS) IMPLANT
TROCAR XCEL NON-BLD 11X100MML (ENDOMECHANICALS) ×5 IMPLANT
TROCAR XCEL NON-BLD 5MMX100MML (ENDOMECHANICALS) ×2 IMPLANT
WATER STERILE IRR 1000ML POUR (IV SOLUTION) ×2 IMPLANT

## 2019-01-01 NOTE — Op Note (Signed)
Laparoscopic Ventral Hernia Repair Procedure Note  Indications: This is a 58 year old female who is status post open repair of a ventral hernia located to the right of her umbilicus.  This had been present for many years.  This was repaired in open fashion on 12/05/2017.  The patient had postoperative complications with bleeding in the subcutaneous space that required wound exploration.  She has had prolonged wound healing issues.  Her wound finally healed but she developed a recurrence of her hernia.  She presents now for attempted laparoscopic repair with mesh.  Pre-operative Diagnosis: Recurrent ventral hernia  Post-operative Diagnosis: Recurrent ventral hernia  Surgeon: Maia Petties   Assistants: Pryor Curia, RNFA  Anesthesia: General endotracheal anesthesia  ASA Class: 2  Procedure Details  The patient was seen in the Holding Room. The risks, benefits, complications, treatment options, and expected outcomes were discussed with the patient. The possibilities of reaction to medication, pulmonary aspiration, perforation of viscus, bleeding, recurrent infection, the need for additional procedures, failure to diagnose a condition, and creating a complication requiring transfusion or operation were discussed with the patient. The patient concurred with the proposed plan, giving informed consent.  The site of surgery properly noted/marked. The patient was taken to the operating room, identified as Connie Myers and the procedure verified as laparoscopic ventral hernia repair with mesh. A Time Out was held and the above information confirmed.    The patient was placed supine.  After establishing general anesthesia, a Foley catheter was placed.  The abdomen was prepped with Chloraprep and draped in standard fashion.  A 5 mm Optiview was used the cannulate the peritoneal cavity in the left upper quadrant below the costal margin.  Pneumoperitoneum was obtained by insufflating CO2,  maintaining a maximum pressure of 15 mmHg.  The 5 mm 30-degree laparoscope was inserted.  There were significant omental adhesions to the anterior abdominal wall in and around the hernia defect.  This was concentrated mostly around the umbilicus.  An 11-mm port was placed in the left anterior axillary line at the level of the umbilicus.  Another 5-mm port was placed in the left lower quadrant.  Harmonic scalpel and gentle traction were used to dissect the omental adhesions away from the anterior abdominal wall.  We cleared the entire abdominal wall and were able to visualize several fascial defects.  There is a 3 cm defect to the right of the umbilicus.  There is a 2.5 cm defect to the left of the umbilicus.  The old mesh seems to be contracted between the 2 defects.  There are scattered Swiss cheese defects around the defect on the right.  We used a spinal needle to identify the extent of the hernia defects.  This covered an area of about 15 cm transversely by 11 cm vertically.  We selected a 18 x 22 cm piece of Bard Ventralight mesh.  We placed 8 stay sutures of 0 Novofil around the edges of the mesh.  The mesh was then rolled up and inserted through the 11 mm port site.  The mesh was then unrolled.  The stay sutures were then pulled up through small stab incisions using the Endo-close device.  This deployed the mesh widely over the fascial defects.  The stay sutures were then tied down.  The Secure Strap device was then used to tack down the edges of the mesh at 1 cm intervals circumferentially. We placed an additional 5 mm port site on the right to allow proper placement  of the tacks. We placed a few tacks inside the outer ring of tacks.  We inspected for hemostasis.  The fascial defect at the 11 mm port site was covered with the lateral edge of the mesh.  Pneumoperitoneum was then released as we removed the remainder of the trocars.  The port sites were closed with 4-0 Monocryl.  All of the incisions and stay  suture sites were then sealed with Dermabond.  An abdominal binder was placed around the patient's abdomen.  The Foley catheter was removed. The patient was extubated and brought to the recovery room in stable condition.  All sponge, instrument, and needle counts were correct prior to closure and at the conclusion of the case.   Findings: Multiple defects 3 cm, 2.5 cm, multiple 1 cm defects  Estimated Blood Loss:  less than 50 mL         Complications:  None; patient tolerated the procedure well.         Disposition: PACU - hemodynamically stable.         Condition: stable  Connie Myers. Connie Dover, MD, [facility] Beeper [phone]  01/01/2019 9:27 AM

## 2019-01-01 NOTE — H&P (Signed)
Connie Myers is an 58 y.o. female.   Chief Complaint: Recurrent hernia HPI: History of Present Illness  The patient is a 58 year old female who presents after an abdominal wall hernia repair 12/28/17 PCP - Sheryn Bison [facility] - [place]  This is a 58 year old female who has had a protruding mass to the right of her umbilicus for at least 18 years. She has had 2 previous C-sections as well as a previous laparoscopic cholecystectomy. Over the last few years she has noticed worsening pain in her right abdominal wall mass. She has had several episodes that are quite severe and have resulted in nausea and vomiting.The mass remains reducible. She has not had any recent imaging. She has lost some weight over the last year. She underwent ventral hernia repair with mesh on 12/05/17 with an 11.4 cm Ventrio mesh.  She was doing well until she developed some oozing from her incision.  We opened the center 2 cm of the incision and the patient has had persistent bleeding from this area.  We attempted to treat this area with packing and Surgicel, but it continues to ooze.  The area does not look infected and there has been no purulent drainage.  She had a long course of poor wound healing, but finally the wound healed completely.  However, she has developed a recurrence of the hernia          Past Medical History:  Diagnosis Date  . Anemia   . Arthritis   . Complication of anesthesia    " I don't tolerate the caine's (Novacaine, Lidocaine etc. well " also, no codiene.   Marland Kitchen GERD (gastroesophageal reflux disease)   . Headache   . Sleep apnea    PMH: " I don't have it now "  . Ventral hernia   . Wears glasses     Past Surgical History:  Procedure Laterality Date  . CESAREAN SECTION    . CHOLECYSTECTOMY    . COLONOSCOPY    . HERNIA REPAIR    . INSERTION OF MESH N/A 12/05/2017   Procedure: INSERTION OF MESH;  Surgeon: Donnie Mesa, MD;  Location: [facility];  Service: General;  Laterality: N/A;   . VENTRAL HERNIA REPAIR  12/05/2017   OPEN VENTRAL HERNIA REPAIR ERAS PATHWAY w/mesh  . VENTRAL HERNIA REPAIR N/A 12/05/2017   Procedure: OPEN VENTRAL HERNIA REPAIR ERAS PATHWAY;  Surgeon: Donnie Mesa, MD;  Location: [facility];  Service: General;  Laterality: N/A;  . WISDOM TOOTH EXTRACTION    . WOUND EXPLORATION N/A 12/29/2017   Procedure: ABDOMINAL WOUND EXPLORATION;  Surgeon: Donnie Mesa, MD;  Location: [facility];  Service: General;  Laterality: N/A;  LMA    Family History  Problem Relation Age of Onset  . Breast cancer Mother    Social History:  reports that she has never smoked. She has never used smokeless tobacco. She reports current alcohol use. She reports previous drug use.  Allergies:  Allergies  Allergen Reactions  . Codeine Nausea Only  . Local [Lidocaine]     Local Anesthesia does not work for her    Medications Prior to Admission  Medication Sig Dispense Refill  . aspirin 81 MG EC tablet Take 81 mg by mouth daily.     . fexofenadine (ALLEGRA) 180 MG tablet Take 180 mg by mouth daily as needed for allergies.    . famotidine (PEPCID) 20 MG tablet Take 1 tablet (20 mg total) by mouth 2 (two) times daily. (Patient not taking:  Reported on 12/20/2018) 30 tablet 0  . gabapentin (NEURONTIN) 100 MG capsule Take 1 capsule (100 mg total) by mouth 3 (three) times daily for 15 days. 45 capsule 0  . oxyCODONE (OXY IR/ROXICODONE) 5 MG immediate release tablet Take 1 tablet (5 mg total) by mouth every 6 (six) hours as needed for moderate pain. (Patient not taking: Reported on 12/20/2018) 28 tablet 0  . oxyCODONE (ROXICODONE) 5 MG immediate release tablet Take 1 tablet (5 mg total) by mouth every 6 (six) hours as needed. (Patient not taking: Reported on 12/20/2018) 20 tablet 0    No results found for this or any previous visit (from the past 48 hour(s)). No results found.  ROS  Blood pressure (!) 151/116, pulse 71, temperature 98.4 F (36.9 C), temperature source Oral, SpO2 96  %. Physical Exam  WDWN in NAD Eyes: Pupils equal, round; sclera anicteric HENT: Oral mucosa moist; good dentition Neck: No masses palpated, no thyromegaly Lungs: CTA bilaterally; normal respiratory effort CV: Regular rate and rhythm; no murmurs; extremities well-perfused with no edema Abd: +bowel sounds, soft, non-tender, no palpable organomegaly;Midline incision - center 2 cm is open.  No surrounding erythema or purulent drainage Wound shows persistent slow oozing Skin: Warm, dry; no sign of jaundice Psychiatric - alert and oriented x 4; calm mood and affect Assessment/Plan Recurrent ventral hernia  Laparoscopic ventral hernia repair with mesh.  The surgical procedure has been discussed with the patient.  Potential risks, benefits, alternative treatments, and expected outcomes have been explained.  All of the patient's questions at this time have been answered.  The likelihood of reaching the patient's treatment goal is good.  The patient understand the proposed surgical procedure and wishes to proceed.   Maia Petties, MD 01/01/2019, 7:18 AM

## 2019-01-01 NOTE — Anesthesia Procedure Notes (Signed)
Procedure Name: Intubation Date/Time: 01/01/2019 7:40 AM Performed by: Colin Benton, CRNA Pre-anesthesia Checklist: Patient identified, Emergency Drugs available, Suction available and Patient being monitored Patient Re-evaluated:Patient Re-evaluated prior to induction Oxygen Delivery Method: Circle system utilized Preoxygenation: Pre-oxygenation with 100% oxygen Induction Type: IV induction Ventilation: Mask ventilation without difficulty and Oral airway inserted - appropriate to patient size Laryngoscope Size: Sabra Heck and 2 Grade View: Grade II Tube type: Oral Tube size: 7.0 mm Number of attempts: 2 Airway Equipment and Method: Stylet Placement Confirmation: ETT inserted through vocal cords under direct vision,  positive ETCO2 and breath sounds checked- equal and bilateral Secured at: 23 cm Tube secured with: Tape Dental Injury: Teeth and Oropharynx as per pre-operative assessment

## 2019-01-01 NOTE — Progress Notes (Signed)
Orthopedic Tech Progress Note Patient Details:  Connie Myers 1961-06-05 [id number] OR RN called requesting an XL ABDOMINAL BINDER. Dropped it off at front desk Ortho Devices Type of Ortho Device: Abdominal binder Ortho Device/Splint Location: stomach Ortho Device/Splint Interventions: Other (comment)   Post Interventions Patient Tolerated: Other (comment) Instructions Provided: Other (comment)   [name] 01/01/2019, 8:34 AM

## 2019-01-01 NOTE — Transfer of Care (Signed)
Immediate Anesthesia Transfer of Care Note  Patient: Connie Myers  Procedure(s) Performed: LAPAROSCOPIC VENTRAL HERNIA REPAIR WITH MESH (N/A Abdomen)  Patient Location: PACU  Anesthesia Type:General  Level of Consciousness: drowsy  Airway & Oxygen Therapy: Patient Spontanous Breathing and Patient connected to face mask oxygen  Post-op Assessment: Report given to RN and Post -op Vital signs reviewed and stable  Post vital signs: Reviewed and stable  Last Vitals:  Vitals Value Taken Time  BP 159/84 01/01/19 0939  Temp    Pulse 67 01/01/19 0943  Resp 18 01/01/19 0943  SpO2 91 % 01/01/19 0943  Vitals shown include unvalidated device data.  Last Pain:  Vitals:   01/01/19 0543  TempSrc: Oral         Complications: No apparent anesthesia complications

## 2019-01-01 NOTE — Anesthesia Postprocedure Evaluation (Signed)
Anesthesia Post Note  Patient: Connie Myers  Procedure(s) Performed: LAPAROSCOPIC VENTRAL HERNIA REPAIR WITH MESH (N/A Abdomen)     Patient location during evaluation: PACU Anesthesia Type: General Level of consciousness: awake and alert Pain management: pain level controlled Vital Signs Assessment: post-procedure vital signs reviewed and stable Respiratory status: spontaneous breathing, nonlabored ventilation, respiratory function stable and patient connected to nasal cannula oxygen Cardiovascular status: blood pressure returned to baseline and stable Postop Assessment: no apparent nausea or vomiting Anesthetic complications: no    Last Vitals:  Vitals:   01/01/19 1009 01/01/19 1024  BP: (!) 142/73 (!) 145/78  Pulse: 65 65  Resp: 16 16  Temp:    SpO2: 96% 94%    Last Pain:  Vitals:   01/01/19 1009  TempSrc:   PainSc: 7                  Iisha Soyars,W. EDMOND

## 2019-01-02 ENCOUNTER — Encounter (HOSPITAL_COMMUNITY): Payer: Self-pay | Admitting: Surgery

## 2019-01-02 DIAGNOSIS — K432 Incisional hernia without obstruction or gangrene: Secondary | ICD-10-CM | POA: Diagnosis not present

## 2019-01-02 MED ORDER — OXYCODONE HCL 5 MG PO TABS: 5.0000 mg | ORAL_TABLET | Freq: Four times a day (QID) | ORAL | 0 refills | Status: DC | PRN

## 2019-01-02 NOTE — TOC Transition Note (Signed)
Transition of Care [facility]) - CM/SW Discharge Note   Patient Details  Name: Connie Myers MRN: [id number] Date of Birth: 1960/08/30  [facility] CM/SW Contact:  Marilu Favre, RN Phone Number: 01/02/2019, 10:29 AM   Clinical Narrative:     Patient requesting walker, ordered same. Zack with [facility] will bring to room.  Final next level of care: Home/Self Care Barriers to Discharge: No Barriers Identified   Patient Goals and CMS Choice Patient states their goals for this hospitalization and ongoing recovery are:: to go home CMS Medicare.gov Compare Post Acute Care list provided to:: Patient Choice offered to / list presented to : NA  Discharge Placement                       Discharge Plan and Services     Post Acute Care Choice: Durable Medical Equipment          DME Arranged: Gilford Rile rolling DME Agency: [employer] Date DME Agency Contacted: 01/02/19 Time DME Agency Contacted: [id number] Representative spoke with at DME Agency: [place]: NA          Social Determinants of Health ([place]) Interventions     Readmission Risk Interventions No flowsheet data found.

## 2019-01-02 NOTE — Discharge Instructions (Signed)
[facility], [place] [phone]  ABDOMINAL SURGERY: POST OP INSTRUCTIONS  Always review your discharge instruction sheet given to you by the facility where your surgery was performed.  IF YOU HAVE DISABILITY OR FAMILY LEAVE FORMS, YOU MUST BRING THEM TO THE OFFICE FOR PROCESSING.  PLEASE DO NOT GIVE THEM TO YOUR DOCTOR.  1. A prescription for pain medication may be given to you upon discharge.  Take your pain medication as prescribed, if needed.  If narcotic pain medicine is not needed, then you may take acetaminophen (Tylenol) or ibuprofen (Advil) as needed. 2. Take your usually prescribed medications unless otherwise directed. 3. If you need a refill on your pain medication, please contact your pharmacy. They will contact our office to request authorization.  Prescriptions will not be filled after 5pm or on week-ends. 4. You should follow a light diet the first few days after arrival home, such as soup and crackers, pudding, etc.unless your doctor has advised otherwise. A high-fiber, low fat diet can be resumed as tolerated.   Be sure to include lots of fluids daily.  5. Most patients will experience some swelling and bruising in the area of the incisiosn. Ice pack will help. Swelling and bruising can take several days to resolve..  6. It is common to experience some constipation if taking pain medication after surgery.  Increasing fluid intake and taking a stool softener will usually help or prevent this problem from occurring.  A mild laxative (Milk of Magnesia or Miralax) should be taken according to package directions if there are no bowel movements after 48 hours. 7.  If your surgeon used skin glue on the incision, you may shower in 24 hours.  The glue will flake off over the next 2-3 weeks.   8. ACTIVITIES:  You may resume regular (light) daily activities beginning the next day--such as daily self-care, walking, climbing stairs--gradually increasing activities as tolerated.   You may have sexual intercourse when it is comfortable.  Refrain from any heavy lifting or straining until approved by your doctor. a. You may drive when you no longer are taking prescription pain medication, you can comfortably wear a seatbelt, and you can safely maneuver your car and apply brakes b. Return to Work: ___________________________________ 56. You should see your doctor in the office for a follow-up appointment approximately two weeks after your surgery.  Make sure that you call for this appointment within a day or two after you arrive home to insure a convenient appointment time. OTHER INSTRUCTIONS:  _____________________________________________________________ _____________________________________________________________  WHEN TO CALL YOUR DOCTOR: 1. Fever over 101.0 2. Inability to urinate 3. Nausea and/or vomiting 4. Extreme swelling or bruising 5. Continued bleeding from incision. 6. Increased pain, redness, or drainage from the incision. 7. Difficulty swallowing or breathing 8. Muscle cramping or spasms. 9. Numbness or tingling in hands or feet or around lips.  The clinic staff is available to answer your questions during regular business hours.  Please dont hesitate to call and ask to speak to one of the nurses if you have concerns.  For further questions, please visit [email or web address]

## 2019-01-02 NOTE — Discharge Summary (Signed)
Physician Discharge Summary  Patient ID: [name] MRN: [id number] DOB/AGE: 1961/05/09 58 y.o.  Admit date: 01/01/2019 Discharge date: 01/02/2019  Admission Diagnoses:  Recurrent ventral incisional hernia  Discharge Diagnoses: same Active Problems:   Recurrent ventral incisional hernia   Discharged Condition: good  Hospital Course: Laparoscopic lysis of adhesions/ ventral hernia repair with mesh on 7/14.  Patient kept overnight for pain control.  Doing well on POD #1.  Tolerating diet.  Ready for discharge.  Consults: None   Treatments: surgery: lap ventral hernia repair with mesh  Discharge Exam: Blood pressure 125/70, pulse 67, temperature 97.9 F (36.6 C), temperature source Oral, resp. rate 16, SpO2 93 %. General appearance: alert, cooperative and no distress GI: incisional tenderness Multiple port sites c/d/i with slight bruising  Disposition: Discharge disposition: 01-Home or Self Care       Discharge Instructions    Call MD for:  persistant nausea and vomiting   Complete by: As directed    Call MD for:  redness, tenderness, or signs of infection (pain, swelling, redness, odor or green/yellow discharge around incision site)   Complete by: As directed    Call MD for:  severe uncontrolled pain   Complete by: As directed    Call MD for:  temperature >100.4   Complete by: As directed    Diet general   Complete by: As directed    Driving Restrictions   Complete by: As directed    Do not drive while taking pain medications   Increase activity slowly   Complete by: As directed    May shower / Bathe   Complete by: As directed      Allergies as of 01/02/2019      Reactions   Codeine Nausea Only   Local [lidocaine]    Local Anesthesia does not work for her      Medication List    TAKE these medications   aspirin 81 MG EC tablet Take 81 mg by mouth daily.   famotidine 20 MG tablet Commonly known as: PEPCID Take 1 tablet (20 mg total) by mouth  2 (two) times daily.   fexofenadine 180 MG tablet Commonly known as: ALLEGRA Take 180 mg by mouth daily as needed for allergies.   gabapentin 100 MG capsule Commonly known as: NEURONTIN Take 1 capsule (100 mg total) by mouth 3 (three) times daily for 15 days.   oxyCODONE 5 MG immediate release tablet Commonly known as: Oxy IR/ROXICODONE Take 1 tablet (5 mg total) by mouth every 6 (six) hours as needed for severe pain. What changed:   reasons to take this  Another medication with the same name was removed. Continue taking this medication, and follow the directions you see here.      Follow-up Information    Donnie Mesa, MD Follow up in 3 week(s).   Specialty: General Surgery Contact information: [address] [place]  [address] [phone]           Signed: Maia Petties 01/02/2019, 8:11 AM

## 2019-01-02 NOTE — Progress Notes (Signed)
Patient discharged to home with instructions and equipment. °

## 2019-10-25 ENCOUNTER — Encounter (HOSPITAL_COMMUNITY): Payer: Self-pay

## 2019-10-25 ENCOUNTER — Emergency Department (HOSPITAL_COMMUNITY)
Admission: EM | Admit: 2019-10-25 | Discharge: 2019-10-25 | Disposition: A | Discharge status: Home / Self Care | Payer: No Typology Code available for payment source | Attending: Emergency Medicine | Admitting: Emergency Medicine

## 2019-10-25 ENCOUNTER — Other Ambulatory Visit: Payer: Self-pay

## 2019-10-25 DIAGNOSIS — Z566 Other physical and mental strain related to work: Secondary | ICD-10-CM | POA: Insufficient documentation

## 2019-10-25 DIAGNOSIS — D75839 Thrombocytosis, unspecified: Secondary | ICD-10-CM

## 2019-10-25 DIAGNOSIS — D473 Essential (hemorrhagic) thrombocythemia: Secondary | ICD-10-CM | POA: Insufficient documentation

## 2019-10-25 DIAGNOSIS — Z7982 Long term (current) use of aspirin: Secondary | ICD-10-CM | POA: Diagnosis not present

## 2019-10-25 DIAGNOSIS — I10 Essential (primary) hypertension: Secondary | ICD-10-CM | POA: Diagnosis not present

## 2019-10-25 DIAGNOSIS — R519 Headache, unspecified: Secondary | ICD-10-CM | POA: Diagnosis not present

## 2019-10-25 LAB — BASIC METABOLIC PANEL
Anion gap: 10 (ref 5–15)
BUN: 10 mg/dL (ref 6–20)
CO2: 25 mmol/L (ref 22–32)
Calcium: 8.7 mg/dL — ABNORMAL LOW (ref 8.9–10.3)
Chloride: 104 mmol/L (ref 98–111)
Creatinine, Ser: 0.65 mg/dL (ref 0.44–1.00)
GFR calc Af Amer: >60 mL/min (ref >60)
GFR calc non Af Amer: >60 mL/min (ref >60)
Glucose, Bld: 97 mg/dL (ref 70–99)
Potassium: 3.8 mmol/L (ref 3.5–5.1)
Sodium: 139 mmol/L (ref 135–145)

## 2019-10-25 LAB — CBC
HCT: 51.3 % — ABNORMAL HIGH (ref 36.0–46.0)
Hemoglobin: 15.8 g/dL — ABNORMAL HIGH (ref 12.0–15.0)
MCH: 20.9 pg — ABNORMAL LOW (ref 26.0–34.0)
MCHC: 30.8 g/dL (ref 30.0–36.0)
MCV: 67.9 fL — ABNORMAL LOW (ref 80.0–100.0)
Platelets: 976 10*3/uL (ref 150–400)
RBC: 7.55 MIL/uL — ABNORMAL HIGH (ref 3.87–5.11)
RDW: 20.2 % — ABNORMAL HIGH (ref 11.5–15.5)
WBC: 9.5 10*3/uL (ref 4.0–10.5)
nRBC: 0 % (ref 0.0–0.2)

## 2019-10-25 MED ORDER — HYDROCHLOROTHIAZIDE 25 MG PO TABS: 25.0000 mg | ORAL_TABLET | Freq: Every day | ORAL | 0 refills | Status: DC

## 2019-10-25 NOTE — ED Notes (Signed)
Date and time results received: 10/25/19 2:53 PM (use smartphrase ".now" to insert current time)  Test: platelets Critical Value: 976  Name of Provider Notified: Petrucelli PA  Orders Received? Or Actions Taken?: no new orders

## 2019-10-25 NOTE — ED Notes (Signed)
Pt verbalized understanding of discharge instructions. Follow up care, prescriptions, and stress relief techniques reviewed. Pt had no further questions and ambulated independently to lobby.

## 2019-10-25 NOTE — Discharge Instructions (Signed)
You were seen in the ED today for elevated blood pressure. Your labs showed that your platelet levels were elevated, but this is similar to prior labs you have had done- this should be rechecked by your primary care provider.  We are starting you on hydrochlorothiazide, and a blood pressure medication, please take this once per day to help with blood pressure control   We have prescribed you new medication(s) today. Discuss the medications prescribed today with your pharmacist as they can have adverse effects and interactions with your other medicines including over the counter and prescribed medications. Seek medical evaluation if you start to experience new or abnormal symptoms after taking one of these medicines, seek care immediately if you start to experience difficulty breathing, feeling of your throat closing, facial swelling, or rash as these could be indications of a more serious allergic reaction  We would like you to take a few days off work to be able to destress some.  We would like you to follow-up very closely with your primary care provider as scheduled for a recheck of your blood pressure as well as a recheck of your mental health.  We have attached our resource guide for local mental health care with a list of counselors as well.  Return to the emergency department anytime for new or worsening symptoms including but not limited to worsening headache, sudden headache onset, change in your vision, numbness, weakness, chest pain, or any other concerns.  Results for orders placed or performed during the hospital encounter of [id number]  Basic metabolic panel  Result Value Ref Range   Sodium 139 135 - 145 mmol/L   Potassium 3.8 3.5 - 5.1 mmol/L   Chloride 104 98 - 111 mmol/L   CO2 25 22 - 32 mmol/L   Glucose, Bld 97 70 - 99 mg/dL   BUN 10 6 - 20 mg/dL   Creatinine, Ser 0.65 0.44 - 1.00 mg/dL   Calcium 8.7 (L) 8.9 - 10.3 mg/dL   GFR calc non Af Amer >60 >60 mL/min   GFR calc Af Amer >60  >60 mL/min   Anion gap 10 5 - 15  CBC  Result Value Ref Range   WBC 9.5 4.0 - 10.5 K/uL   RBC 7.55 (H) 3.87 - 5.11 MIL/uL   Hemoglobin 15.8 (H) 12.0 - 15.0 g/dL   HCT 51.3 (H) 36.0 - 46.0 %   MCV 67.9 (L) 80.0 - 100.0 fL   MCH 20.9 (L) 26.0 - 34.0 pg   MCHC 30.8 30.0 - 36.0 g/dL   RDW 20.2 (H) 11.5 - 15.5 %   Platelets 976 (HH) 150 - 400 K/uL   nRBC 0.0 0.0 - 0.2 %   No results found.   Vitals:   10/25/19 1326 10/25/19 1450 10/25/19 1504 10/25/19 1600  BP: (!) 135/104 (!) 173/87 (!) 171/93 (!) 158/84  Pulse: 71 74 69 64  Resp: 18 20 17  (!) 21  Temp: 98.1 F (36.7 C)     TempSrc: Oral     SpO2: 97% 94% 93% 92%  Weight: 110.6 kg     Height: 5\' 9"  (1.753 m)

## 2019-10-25 NOTE — ED Provider Notes (Signed)
[facility] EMERGENCY DEPARTMENT Provider Note   CSN: [id number] Arrival date & time: 10/25/19  1321     History Chief Complaint  Patient presents with  . Hypertension    Connie Myers is a 59 y.o. female with a history of anemia, sleep apnea, GERD, and headaches who presents to the emergency department with complaints of hypertension. She states that she went into work today for a routine check up today and they noted her BP to be elevated 186/124 and subsequently 165/93, given elevated BP she was sent to the ED. In terms of sxs she states she has had a great deal of stress at work, especially since starting to work from home, she has also noted frequent headaches (gradual onset, steady progression, similar to prior).  Symptoms seem worse when she is working.  No alleviating factors.  Headaches have been almost daily. Denies visual disturbance, vomiting, neck stiffness, fever, numbness, weakness, dizziness, or chest pain. She has not taken antihypertensive medication in the past.  She was planning to see her PCP today, but they are now out of network, she has an appointment with a new PCP scheduled for 2 weeks from now.  HPI     Past Medical History:  Diagnosis Date  . Anemia   . Arthritis   . Complication of anesthesia    " I don't tolerate the caine's (Novacaine, Lidocaine etc. well " also, no codiene.   Marland Kitchen GERD (gastroesophageal reflux disease)   . Headache   . Sleep apnea    PMH: " I don't have it now "  . Ventral hernia   . Wears glasses     Patient Active Problem List   Diagnosis Date Noted  . Recurrent ventral incisional hernia 01/01/2019  . S/P repair of ventral hernia 12/05/2017    Past Surgical History:  Procedure Laterality Date  . CESAREAN SECTION    . CHOLECYSTECTOMY    . COLONOSCOPY    . HERNIA REPAIR    . INSERTION OF MESH N/A 12/05/2017   Procedure: INSERTION OF MESH;  Surgeon: Donnie Mesa, MD;  Location: [facility];  Service:  General;  Laterality: N/A;  . VENTRAL HERNIA REPAIR  12/05/2017   OPEN VENTRAL HERNIA REPAIR ERAS PATHWAY w/mesh  . VENTRAL HERNIA REPAIR N/A 12/05/2017   Procedure: OPEN VENTRAL HERNIA REPAIR ERAS PATHWAY;  Surgeon: Donnie Mesa, MD;  Location: [facility];  Service: General;  Laterality: N/A;  . VENTRAL HERNIA REPAIR  01/01/2019  . VENTRAL HERNIA REPAIR N/A 01/01/2019   Procedure: LAPAROSCOPIC VENTRAL HERNIA REPAIR WITH MESH;  Surgeon: Donnie Mesa, MD;  Location: [facility];  Service: General;  Laterality: N/A;  . WISDOM TOOTH EXTRACTION    . WOUND EXPLORATION N/A 12/29/2017   Procedure: ABDOMINAL WOUND EXPLORATION;  Surgeon: Donnie Mesa, MD;  Location: [facility];  Service: General;  Laterality: N/A;  LMA     OB History   No obstetric history on file.     Family History  Problem Relation Age of Onset  . Breast cancer Mother     Social History   Tobacco Use  . Smoking status: Never Smoker  . Smokeless tobacco: Never Used  Substance Use Topics  . Alcohol use: Yes    Comment: occasionally  . Drug use: Not Currently    Home Medications Prior to Admission medications   Medication Sig Start Date End Date Taking? Authorizing Provider  aspirin 81 MG EC tablet Take 81 mg by mouth daily.  [provider]  famotidine (PEPCID) 20 MG tablet Take 1 tablet (20 mg total) by mouth 2 (two) times daily. Patient not taking: Reported on 12/20/2018 06/08/18   Carmin Muskrat, MD  fexofenadine (ALLEGRA) 180 MG tablet Take 180 mg by mouth daily as needed for allergies.    [provider]  gabapentin (NEURONTIN) 100 MG capsule Take 1 capsule (100 mg total) by mouth 3 (three) times daily for 15 days. 06/08/18 06/23/18  Carmin Muskrat, MD  oxyCODONE (OXY IR/ROXICODONE) 5 MG immediate release tablet Take 1 tablet (5 mg total) by mouth every 6 (six) hours as needed for severe pain. 01/02/19   Donnie Mesa, MD    Allergies    Codeine and Local [lidocaine]  Review of Systems   Review  of Systems  Constitutional: Negative for chills and fever.  Eyes: Negative for visual disturbance.  Respiratory: Negative for shortness of breath.   Cardiovascular: Negative for chest pain.  Gastrointestinal: Negative for abdominal pain, nausea and vomiting.  Neurological: Positive for headaches. Negative for dizziness, syncope, weakness and numbness.  Psychiatric/Behavioral: Negative for suicidal ideas. The patient is nervous/anxious (stress).   All other systems reviewed and are negative.   Physical Exam Updated Vital Signs BP (!) 135/104 (BP Location: Right Arm)   Pulse 71   Temp 98.1 F (36.7 C) (Oral)   Resp 18   Ht 5\' 9"  (1.753 m)   Wt 110.6 kg   SpO2 97%   BMI 36.00 kg/m   Physical Exam Vitals and nursing note reviewed.  Constitutional:      General: She is not in acute distress.    Appearance: Normal appearance. She is not toxic-appearing.  HENT:     Head: Normocephalic and atraumatic.     Mouth/Throat:     Pharynx: Oropharynx is clear. Uvula midline.  Eyes:     General: Vision grossly intact. Gaze aligned appropriately.     Extraocular Movements: Extraocular movements intact.     Conjunctiva/sclera: Conjunctivae normal.     Pupils: Pupils are equal, round, and reactive to light.     Comments: No proptosis.   Cardiovascular:     Rate and Rhythm: Normal rate and regular rhythm.  Pulmonary:     Effort: Pulmonary effort is normal.     Breath sounds: Normal breath sounds.  Abdominal:     General: There is no distension.     Palpations: Abdomen is soft.     Tenderness: There is no abdominal tenderness. There is no guarding or rebound.  Musculoskeletal:     Cervical back: Normal range of motion and neck supple. No rigidity.  Skin:    General: Skin is warm and dry.  Neurological:     Mental Status: She is alert.     Comments: Alert. Clear speech. No facial droop. CNIII-XII grossly intact. Bilateral upper and lower extremities' sensation grossly intact. 5/5  symmetric strength with grip strength and with plantar and dorsi flexion bilaterally . Normal finger to nose bilaterally. Negative pronator drift. Gait intact.    Psychiatric:        Mood and Affect: Mood normal.        Behavior: Behavior normal.     ED Results / Procedures / Treatments   Labs (all labs ordered are listed, but only abnormal results are displayed) Labs Reviewed  BASIC METABOLIC PANEL - Abnormal; Notable for the following components:      Result Value   Calcium 8.7 (*)    All other components within normal  limits  CBC - Abnormal; Notable for the following components:   RBC 7.55 (*)    Hemoglobin 15.8 (*)    HCT 51.3 (*)    MCV 67.9 (*)    MCH 20.9 (*)    RDW 20.2 (*)    Platelets 976 (*)    All other components within normal limits  PATHOLOGIST SMEAR REVIEW    EKG None  Radiology No results found.  Procedures Procedures (including critical care time)  Medications Ordered in ED Medications - No data to display  ED Course  I have reviewed the triage vital signs and the nursing notes.  Pertinent labs & imaging results that were available during my care of the patient were reviewed by me and considered in my medical decision making (see chart for details).    MDM Rules/Calculators/A&P                     Patient presents to the ED with complaints of hypertension. Nontoxic, vitals WNL with the exception of elevated BP- based on H&P low suspicion for HTN emergency.   Additional history obtained:  Additional history obtained from review of nursing notes and prior notes.   Lab Tests:  I Ordered, reviewed, and interpreted labs, which included:  CBC: Elevated hgb/hct and platelet count- similar to prior.  BMP: Mild hypocalcemia, no significant electrolyte derangement. Renal function preserved.   ED Course:  Patient presents with hypertension in the setting of increased rest at work.  She does mention some headaches in terms of symptoms, however they  have gradual onset and are similar to prior, given their frequency we discussed the option of CT of the head, discussed risk/benefits, patient ultimately opted to hold off on this in the emergency department setting which I am in agreement with.  She is afebrile, she has no nuchal rigidity, no visual disturbance, and no focal neurologic deficits, I overall have a low suspicion for meningitis, dural venous sinus thrombosis, acute angle-closure glaucoma, temporal arteritis, subarachnoid hemorrhage, or CVA.  Her initial blood pressure in triage was 135/104, upon my entering of the room and her discussing work her blood pressure increased to 171/93.  Her labs appear similar to prior.  She has a benign physical exam.  Discussed findings and plan of care with supervising physician Dr. Vanita Panda, recommend starting hydrochlorothiazide 25 mg which I am in agreement with.  Patient overall appears appropriate for discharge home with close follow-up as scheduled. Will provide work for note as well. I discussed results, treatment plan, need for follow-up, and return precautions with the patient. Provided opportunity for questions, patient confirmed understanding and is in agreement with plan.   Portions of this note were generated with [employer]. Dictation errors may occur despite best attempts at proofreading.  Final Clinical Impression(s) / ED Diagnoses Final diagnoses:  Hypertension, unspecified type  Thrombocytosis ([place])  Stress at work    Rx / DC Orders ED Discharge Orders         Ordered    hydrochlorothiazide (HYDRODIURIL) 25 MG tablet  Daily     10/25/19 [address], [place], PA-C 10/25/19 1619    Carmin Muskrat, MD 10/25/19 [phone]

## 2019-10-25 NOTE — ED Triage Notes (Signed)
Pt reports HTN when she had a checkup today. 186/134 and 165/93, pt reports increased stress at work lately with more frequent headaches and fatigue. Pt a.o, resp e.u

## 2019-10-28 LAB — PATHOLOGIST SMEAR REVIEW

## 2019-12-11 ENCOUNTER — Telehealth: Payer: Self-pay | Admitting: Hematology and Oncology

## 2019-12-11 NOTE — Telephone Encounter (Signed)
Received a new hem referral from Dr. [name] for thrombocythemia. Connie Myers has been cld and scheduled to see Dr. Lorenso Courier on 7/1 at 8am. Pt aware to arrive 15 minutes early.

## 2019-12-18 NOTE — Progress Notes (Signed)
[place] Telephone:([phone]) [phone]   Fax:([phone] [place] NOTE  Patient Care Team: Kristen Loader, FNP as PCP - General (Family Medicine)  Hematological/Oncological History # Thrombocytosis # Erythrocytosis 1) 08/14/2017: WBC 8.0, Hgb 16.2, Plt 713, MCV 71.9. First CBC on record 2) 12/29/2017: WBC 14.1, Hgb 15.0, MCV 72.1, Plt 1074 3) 12/28/2018: WBC 10.9, Hgb 16.3, MCV 67.8, Hgb 982 4) 10/25/2019: WBC 9.5, Hgb 15.8, MCV 67.9, Plt 976 5) 12/19/2019: establish care with Dr. Lorenso Courier   CHIEF COMPLAINTS/PURPOSE OF CONSULTATION:  "Thrombocytosis "  HISTORY OF PRESENTING ILLNESS:  Connie Myers 59 y.o. female with medical history significant for HTN and OSA who presents for evaluation of thrombocytosis/erythrocytosis.   On review of the previous records Connie Myers has had a longstanding erythrocytosis and thrombocytosis dating back to at least February 2019.  On 08/14/2017 the patient is found to have white blood cell count 8.0, hemoglobin 16.2, platelet of 713, MCV of 71.9.  This is the first CBC we have on record.  The patient's platelet count has reached as high as 1074 on 12/29/2017.  Most recently on 10/25/2019 the patient was found to have white blood cell count of 9.5, hemoglobin 15.8, MCV of 67.9, and a platelet count of 976.  Due to concern for this patient's erythrocytosis and thrombocytosis she was referred to hematology for further evaluation and management.  On exam today Connie Myers ports that she is only been aware of the high platelet count for approximately 2 years.  She reports that she underwent C-section in 1996 in 2001 and was not reportedly told that she had any hematological abnormalities at that time.  He also reports that in August she had a gallbladder removal and was unsure about any blood abnormalities at that time.  She does report that approximately 2 years ago she had an open ventral hernia repair and unfortunately had hemorrhage and  poor wound healing after that time.  The patient reports that she has had no issues with overt signs of bleeding, bruising, or dark stools.  She also reports that she is not prone to itching after removing herself from the shower.  She notes that she was previously placed on iron pills in the past but it was far remote.  She notes that she does have some swelling in her lower extremities and that she is on her feet all day but has no prior history of blood clots.  On further discussion she does report that she does have periodic headaches which is a relatively new issue for her.  Her family history is not remarkable for any hematological malignancies or disorders.  She was a smoker in the 1980s but only smoked for 2 or so years and only a couple cigarettes per week.  On review today she denies having any issues with fevers, chills, sweats, nausea, vomiting or diarrhea.  MEDICAL HISTORY:  Past Medical History:  Diagnosis Date  . Anemia   . Arthritis   . Complication of anesthesia    " I don't tolerate the caine's (Novacaine, Lidocaine etc. well " also, no codiene.   Marland Kitchen GERD (gastroesophageal reflux disease)   . Headache   . Sleep apnea    PMH: " I don't have it now "  . Ventral hernia   . Wears glasses     SURGICAL HISTORY: Past Surgical History:  Procedure Laterality Date  . CESAREAN SECTION    . CHOLECYSTECTOMY    . COLONOSCOPY    .  HERNIA REPAIR    . INSERTION OF MESH N/A 12/05/2017   Procedure: INSERTION OF MESH;  Surgeon: Donnie Mesa, MD;  Location: [facility];  Service: General;  Laterality: N/A;  . VENTRAL HERNIA REPAIR  12/05/2017   OPEN VENTRAL HERNIA REPAIR ERAS PATHWAY w/mesh  . VENTRAL HERNIA REPAIR N/A 12/05/2017   Procedure: OPEN VENTRAL HERNIA REPAIR ERAS PATHWAY;  Surgeon: Donnie Mesa, MD;  Location: [facility];  Service: General;  Laterality: N/A;  . VENTRAL HERNIA REPAIR  01/01/2019  . VENTRAL HERNIA REPAIR N/A 01/01/2019   Procedure: LAPAROSCOPIC VENTRAL HERNIA REPAIR WITH  MESH;  Surgeon: Donnie Mesa, MD;  Location: [place];  Service: General;  Laterality: N/A;  . WISDOM TOOTH EXTRACTION    . WOUND EXPLORATION N/A 12/29/2017   Procedure: ABDOMINAL WOUND EXPLORATION;  Surgeon: Donnie Mesa, MD;  Location: [place];  Service: General;  Laterality: N/A;  LMA    SOCIAL HISTORY: Social History   Socioeconomic History  . Marital status: Married    Spouse name: Not on file  . Number of children: Not on file  . Years of education: Not on file  . Highest education level: Not on file  Occupational History  . Not on file  Tobacco Use  . Smoking status: Never Smoker  . Smokeless tobacco: Never Used  Vaping Use  . Vaping Use: Never used  Substance and Sexual Activity  . Alcohol use: Yes    Comment: occasionally  . Drug use: Not Currently  . Sexual activity: Not on file  Other Topics Concern  . Not on file  Social History Narrative  . Not on file   Social Determinants of Health   Financial Resource Strain:   . Difficulty of Paying Living Expenses:   Food Insecurity:   . Worried About Charity fundraiser in the Last Year:   . Arboriculturist in the Last Year:   Transportation Needs:   . Film/video editor (Medical):   Marland Kitchen Lack of Transportation (Non-Medical):   Physical Activity:   . Days of Exercise per Week:   . Minutes of Exercise per Session:   Stress:   . Feeling of Stress :   Social Connections:   . Frequency of Communication with Friends and Family:   . Frequency of Social Gatherings with Friends and Family:   . Attends Religious Services:   . Active Member of Clubs or Organizations:   . Attends Archivist Meetings:   Marland Kitchen Marital Status:   Intimate Partner Violence:   . Fear of Current or Ex-Partner:   . Emotionally Abused:   Marland Kitchen Physically Abused:   . Sexually Abused:     FAMILY HISTORY: Family History  Problem Relation Age of Onset  . Breast cancer Mother     ALLERGIES:  is allergic to codeine and local  [lidocaine].  MEDICATIONS:  Current Outpatient Medications  Medication Sig Dispense Refill  . fexofenadine (ALLEGRA) 180 MG tablet Take 180 mg by mouth daily as needed for allergies.    . hydrochlorothiazide (HYDRODIURIL) 25 MG tablet Take 1 tablet (25 mg total) by mouth daily. 30 tablet 0  . irbesartan (AVAPRO) 150 MG tablet Take 150 mg by mouth daily.     No current facility-administered medications for this visit.    REVIEW OF SYSTEMS:   Constitutional: ( - ) fevers, ( - )  chills , ( - ) night sweats Eyes: ( - ) blurriness of vision, ( - ) double vision, ( - )  watery eyes Ears, nose, mouth, throat, and face: ( - ) mucositis, ( - ) sore throat Respiratory: ( - ) cough, ( - ) dyspnea, ( - ) wheezes Cardiovascular: ( - ) palpitation, ( - ) chest discomfort, ( - ) lower extremity swelling Gastrointestinal:  ( - ) nausea, ( - ) heartburn, ( - ) change in bowel habits Skin: ( - ) abnormal skin rashes Lymphatics: ( - ) new lymphadenopathy, ( - ) easy bruising Neurological: ( - ) numbness, ( - ) tingling, ( - ) new weaknesses Behavioral/Psych: ( - ) mood change, ( - ) new changes  All other systems were reviewed with the patient and are negative.  PHYSICAL EXAMINATION: ECOG PERFORMANCE STATUS: 1 - Symptomatic but completely ambulatory  Vitals:   12/19/19 0822  BP: (!) 154/94  Pulse: 73  Resp: 18  Temp: 97.7 F (36.5 C)  SpO2: 96%   Filed Weights   12/19/19 0822  Weight: 239 lb 6.4 oz (108.6 kg)    GENERAL: well appearing middle aged female in NAD  SKIN: skin color, texture, turgor are normal, no rashes or significant lesions EYES: conjunctiva are pink and non-injected, sclera clear LUNGS: clear to auscultation and percussion with normal breathing effort HEART: regular rate & rhythm and no murmurs and no lower extremity edema ABDOMEN: soft, non-tender, non-distended, normal bowel sounds Musculoskeletal: no cyanosis of digits and no clubbing  PSYCH: alert & oriented x 3,  fluent speech NEURO: no focal motor/sensory deficits  LABORATORY DATA:  I have reviewed the data as listed CBC Latest Ref Rng & Units 12/19/2019 10/25/2019 01/01/2019  WBC 4.0 - 10.5 K/uL 11.3(H) 9.5 12.1(H)  Hemoglobin 12.0 - 15.0 g/dL 17.2(H) 15.8(H) 15.6(H)  Hematocrit 36 - 46 % 55.4(H) 51.3(H) 51.9(H)  Platelets 150 - 400 K/uL 866(H) 976(HH) 949(HH)    CMP Latest Ref Rng & Units 12/19/2019 10/25/2019 01/01/2019  Glucose 70 - 99 mg/dL 100(H) 97 -  BUN 6 - 20 mg/dL 10 10 -  Creatinine 0.44 - 1.00 mg/dL 0.72 0.65 0.69  Sodium 135 - 145 mmol/L 140 139 -  Potassium 3.5 - 5.1 mmol/L 4.2 3.8 -  Chloride 98 - 111 mmol/L 105 104 -  CO2 22 - 32 mmol/L 24 25 -  Calcium 8.9 - 10.3 mg/dL 9.1 8.7(L) -  Total Protein 6.5 - 8.1 g/dL 7.9 - -  Total Bilirubin 0.3 - 1.2 mg/dL 0.6 - -  Alkaline Phos 38 - 126 U/L 108 - -  AST 15 - 41 U/L 15 - -  ALT 0 - 44 U/L 16 - -    RADIOGRAPHIC STUDIES: No results found.  ASSESSMENT & PLAN Connie Myers 59 y.o. female with medical history significant for HTN and OSA who presents for evaluation of thrombocytosis/erythrocytosis.  After review of the labs, discussion with the patient and review of the prior records her findings are most consistent with a myeloproliferative neoplasm.  My strongest suspicion at this time would be that the patient has polycythemia vera and concurrent iron deficiency anemia.  This would give the finding of thrombocytosis, erythrocytosis and a microcytosis as well.  The patient currently has all of these above findings.  It is essential that in this case the patient not receive iron therapy as her iron is artificially keeping her hemoglobin lower than it would otherwise be.  Often the goal in polycythemia vera is to phlebotomize or provide cytoreductive therapy in order to keep the hemoglobin down.  In these patients the target  hematocrit is often side is less than 45 and there is some evidence to suggest that the target for women should be  less than 42.  Today we will work towards confirming the diagnosis of either polycythemia vera or ET.  We will order a JAK2 panel with reflex as well as a BCR able fish.  We will assure that her iron levels are indeed low by checking iron panel, ferritin, and reticulocyte panel.  In order to confirm the diagnosis we will need to proceed with a bone marrow biopsy.  I am fairly sure that this is the direction we will need to proceed and therefore I discussed the procedure with the patient, but I would prefer to wait for the JAK2 panel, to come back before making that decision.  The patient is currently taking baby aspirin and I would recommend that she continue this for thromboprophylaxis in the interim.  # Thrombocytosis # Erythrocytosis, microcytosis --findings are most concerning for an MPN, either PV or ET. Will order an MPN panel with JAK2 w/ reflex and BCR/ABL FISH --additional studies to include CBC, CMP, PT/INR, PTT, iron panel, ferritin, and reticulocyte panel.  --patient will most likely require a bone marrow biopsy. Will await the results of the MPN panels before scheduling this --Even if patient is iron deficient, will hold on iron supplementation ( as this may be keeping the patient's Hgb low and prevent her from requiring cytoreductive therapy).  --recommend daily ASA 32m for thromboprophyalxis. May need to hold if patient has symptoms of an acquired vWF deficiency from the thrombocytosis.  --RTC in 6 weeks (presumably after MPN panel and bone marrow biopsy completed respectively).   Orders Placed This Encounter  Procedures  . CBC with Differential (Cancer Center Only)    Standing Status:   Future    Number of Occurrences:   1    Standing Expiration Date:   12/17/2020  . Retic Panel    Standing Status:   Future    Number of Occurrences:   1    Standing Expiration Date:   12/17/2020  . Save Smear (SSMR)    Standing Status:   Future    Number of Occurrences:   1    Standing  Expiration Date:   12/17/2020  . CMP ([facility])    Standing Status:   Future    Number of Occurrences:   1    Standing Expiration Date:   12/17/2020  . Lactate dehydrogenase (LDH)    Standing Status:   Future    Number of Occurrences:   1    Standing Expiration Date:   12/17/2020  . Ferritin    Standing Status:   Future    Number of Occurrences:   1    Standing Expiration Date:   12/17/2020  . Iron and TIBC    Standing Status:   Future    Number of Occurrences:   1    Standing Expiration Date:   12/17/2020  . BCR ABL1 FISH ([facility])    Standing Status:   Future    Number of Occurrences:   1    Standing Expiration Date:   12/17/2020  . JAK2 (INCLUDING V617F AND EXON 12), MPL,& CALR W/RFL MPN PANEL (NGS)    Standing Status:   Future    Number of Occurrences:   1    Standing Expiration Date:   12/17/2020  . Erythropoietin    Standing Status:   Future    Number of  Occurrences:   1    Standing Expiration Date:   12/17/2020  . Sedimentation rate    Standing Status:   Future    Number of Occurrences:   1    Standing Expiration Date:   12/18/2020    All questions were answered. The patient knows to call the clinic with any problems, questions or concerns.  A total of more than 60 minutes were spent on this encounter and over half of that time was spent on counseling and coordination of care as outlined above.   [name], MD Department of Hematology/Oncology [place] at [facility] Phone: [phone] Pager: [phone] Email: [email or web address]  12/20/2019 3:34 PM

## 2019-12-19 ENCOUNTER — Inpatient Hospital Stay
Payer: No Typology Code available for payment source | Attending: Hematology and Oncology | Admitting: Hematology and Oncology

## 2019-12-19 ENCOUNTER — Other Ambulatory Visit: Payer: Self-pay

## 2019-12-19 ENCOUNTER — Inpatient Hospital Stay: Payer: No Typology Code available for payment source

## 2019-12-19 VITALS — BP 154/94 | HR 73 | Temp 97.7°F | Resp 18 | Ht 69.0 in | Wt 239.4 lb

## 2019-12-19 DIAGNOSIS — D509 Iron deficiency anemia, unspecified: Secondary | ICD-10-CM | POA: Insufficient documentation

## 2019-12-19 DIAGNOSIS — Z803 Family history of malignant neoplasm of breast: Secondary | ICD-10-CM | POA: Diagnosis not present

## 2019-12-19 DIAGNOSIS — Z79899 Other long term (current) drug therapy: Secondary | ICD-10-CM | POA: Diagnosis not present

## 2019-12-19 DIAGNOSIS — R718 Other abnormality of red blood cells: Secondary | ICD-10-CM | POA: Diagnosis not present

## 2019-12-19 DIAGNOSIS — D751 Secondary polycythemia: Secondary | ICD-10-CM | POA: Insufficient documentation

## 2019-12-19 DIAGNOSIS — D473 Essential (hemorrhagic) thrombocythemia: Secondary | ICD-10-CM | POA: Insufficient documentation

## 2019-12-19 DIAGNOSIS — D75839 Thrombocytosis, unspecified: Secondary | ICD-10-CM

## 2019-12-19 DIAGNOSIS — Z885 Allergy status to narcotic agent status: Secondary | ICD-10-CM | POA: Diagnosis not present

## 2019-12-19 DIAGNOSIS — M1990 Unspecified osteoarthritis, unspecified site: Secondary | ICD-10-CM | POA: Diagnosis not present

## 2019-12-19 DIAGNOSIS — R7989 Other specified abnormal findings of blood chemistry: Secondary | ICD-10-CM | POA: Insufficient documentation

## 2019-12-19 LAB — CBC WITH DIFFERENTIAL (CANCER CENTER ONLY)
Abs Immature Granulocytes: 0.13 10*3/uL — ABNORMAL HIGH (ref 0.00–0.07)
Basophils Absolute: 0.3 10*3/uL — ABNORMAL HIGH (ref 0.0–0.1)
Basophils Relative: 2 %
Eosinophils Absolute: 0.9 10*3/uL — ABNORMAL HIGH (ref 0.0–0.5)
Eosinophils Relative: 8 %
HCT: 55.4 % — ABNORMAL HIGH (ref 36.0–46.0)
Hemoglobin: 17.2 g/dL — ABNORMAL HIGH (ref 12.0–15.0)
Immature Granulocytes: 1 %
Lymphocytes Relative: 18 %
Lymphs Abs: 2 10*3/uL (ref 0.7–4.0)
MCH: 20.9 pg — ABNORMAL LOW (ref 26.0–34.0)
MCHC: 31 g/dL (ref 30.0–36.0)
MCV: 67.4 fL — ABNORMAL LOW (ref 80.0–100.0)
Monocytes Absolute: 1.2 10*3/uL — ABNORMAL HIGH (ref 0.1–1.0)
Monocytes Relative: 10 %
Neutro Abs: 6.9 10*3/uL (ref 1.7–7.7)
Neutrophils Relative %: 61 %
Platelet Count: 866 10*3/uL — ABNORMAL HIGH (ref 150–400)
RBC: 8.22 MIL/uL — ABNORMAL HIGH (ref 3.87–5.11)
RDW: 21.3 % — ABNORMAL HIGH (ref 11.5–15.5)
WBC Count: 11.3 10*3/uL — ABNORMAL HIGH (ref 4.0–10.5)
nRBC: 0 % (ref 0.0–0.2)

## 2019-12-19 LAB — CMP (CANCER CENTER ONLY)
ALT: 16 U/L (ref 0–44)
AST: 15 U/L (ref 15–41)
Albumin: 4.1 g/dL (ref 3.5–5.0)
Alkaline Phosphatase: 108 U/L (ref 38–126)
Anion gap: 11 (ref 5–15)
BUN: 10 mg/dL (ref 6–20)
CO2: 24 mmol/L (ref 22–32)
Calcium: 9.1 mg/dL (ref 8.9–10.3)
Chloride: 105 mmol/L (ref 98–111)
Creatinine: 0.72 mg/dL (ref 0.44–1.00)
GFR, Est AFR Am: >60 mL/min (ref >60)
GFR, Estimated: >60 mL/min (ref >60)
Glucose, Bld: 100 mg/dL — ABNORMAL HIGH (ref 70–99)
Potassium: 4.2 mmol/L (ref 3.5–5.1)
Sodium: 140 mmol/L (ref 135–145)
Total Bilirubin: 0.6 mg/dL (ref 0.3–1.2)
Total Protein: 7.9 g/dL (ref 6.5–8.1)

## 2019-12-19 LAB — RETIC PANEL
Immature Retic Fract: 9.8 % (ref 2.3–15.9)
RBC.: 8.22 MIL/uL — ABNORMAL HIGH (ref 3.87–5.11)
Retic Count, Absolute: 115.9 10*3/uL (ref 19.0–186.0)
Retic Ct Pct: 1.4 % (ref 0.4–3.1)
Reticulocyte Hemoglobin: 25 pg — ABNORMAL LOW (ref >27.9)

## 2019-12-19 LAB — IRON AND TIBC
Iron: 23 ug/dL — ABNORMAL LOW (ref 41–142)
Saturation Ratios: 6 % — ABNORMAL LOW (ref 21–57)
TIBC: 386 ug/dL (ref 236–444)
UIBC: 364 ug/dL (ref 120–384)

## 2019-12-19 LAB — SAVE SMEAR(SSMR), FOR PROVIDER SLIDE REVIEW

## 2019-12-19 LAB — SEDIMENTATION RATE: Sed Rate: 1 mm/hr (ref 0–22)

## 2019-12-19 LAB — LACTATE DEHYDROGENASE: LDH: 359 U/L — ABNORMAL HIGH (ref 98–192)

## 2019-12-19 LAB — FERRITIN: Ferritin: 14 ng/mL (ref 11–307)

## 2019-12-20 ENCOUNTER — Encounter: Payer: Self-pay | Admitting: Hematology and Oncology

## 2019-12-20 ENCOUNTER — Telehealth: Payer: Self-pay | Admitting: Hematology and Oncology

## 2019-12-20 LAB — ERYTHROPOIETIN: Erythropoietin: 0.7 m[IU]/mL — ABNORMAL LOW (ref 2.6–18.5)

## 2019-12-20 NOTE — Telephone Encounter (Signed)
Scheduled per los. Called, not able to leave msg. Mailed printout  

## 2019-12-27 ENCOUNTER — Telehealth: Payer: Self-pay | Admitting: Hematology and Oncology

## 2019-12-27 DIAGNOSIS — D75839 Thrombocytosis, unspecified: Secondary | ICD-10-CM

## 2019-12-27 DIAGNOSIS — Z1589 Genetic susceptibility to other disease: Secondary | ICD-10-CM

## 2019-12-27 LAB — JAK2 (INCLUDING V617F AND EXON 12), MPL,& CALR W/RFL MPN PANEL (NGS)

## 2019-12-27 LAB — BCR ABL1 FISH (GENPATH)

## 2019-12-27 NOTE — Telephone Encounter (Signed)
Called Connie Myers discussed the results of her blood work from her prior visit.  Her results showed a low erythropoietin level as well as a JAK2 mutation on her MPN NGS panel.  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  We will have the patient scheduled for a bone marrow biopsy as soon as is feasible.  No answer, left a message requesting callback.   [name], MD Department of Hematology/Oncology [place] at [facility] Phone: [phone] Pager: [phone] Email: [email or web address]

## 2019-12-27 NOTE — Telephone Encounter (Signed)
Called Connie Myers to discuss the results of her blood work from her prior visit.  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  It is most likely that the polycythemia is because the patient become iron deficient and the subsequent iron deficiency has resulted in the thrombocytosis.  In order to help confirm the diagnosis I recommended a bone marrow biopsy.  The patient notes that she has issues with the "caines" and would be nervous about having a bone marrow biopsy performed in clinic with local lidocaine.  As such I will make a referral to interventional radiology for a bone marrow biopsy.  We will plan to have the patient return to clinic after the results of the bone marrow biopsy have returned.  [name], MD Department of Hematology/Oncology [place] at [facility] Phone: [phone] Pager: [phone] Email: [email or web address]

## 2019-12-31 ENCOUNTER — Telehealth: Payer: Self-pay | Admitting: *Deleted

## 2019-12-31 NOTE — Telephone Encounter (Signed)
Patient called wanting to see if she could have a intermin appontment before her next visit to discuss some further questions she has come up with. She was hopeful for an opening this week before 10:30 am.  She also expressed concern about if there were any other facilities locally that could do the same workup in case her insurance is out of network with [facility].  That would be something she would need to call her insurance company to discuss.  Routed to Dr. Lorenso Courier to see if he would consider calling the patient directly as his schedule currently does not have any openings that would accomodates her request.  Pending response.

## 2019-12-31 NOTE — Telephone Encounter (Signed)
Dr. Lorenso Courier responded verbally that he would reach out to the patient in the afternoon to speak to her directly and not utilize an appointment slot.

## 2020-01-02 ENCOUNTER — Telehealth: Payer: Self-pay

## 2020-01-02 NOTE — Telephone Encounter (Signed)
Received call from patient requesting procedure codes / CPT codes for her ordered procedures so that she can check with her insurance company for coverage and or cost. Provided the following to patient: Bone Marrow Biopsy - CPT 38381 CT Biopsy - CPT [phone] Patient verbalized understanding.

## 2020-01-14 ENCOUNTER — Other Ambulatory Visit: Payer: Self-pay | Admitting: Radiology

## 2020-01-16 ENCOUNTER — Ambulatory Visit (HOSPITAL_COMMUNITY)
Admission: RE | Admit: 2020-01-16 | Discharge: 2020-01-16 | Disposition: A | Discharge status: Home / Self Care | Payer: No Typology Code available for payment source | Source: Ambulatory Visit | Attending: Hematology and Oncology | Admitting: Hematology and Oncology

## 2020-01-16 ENCOUNTER — Encounter (HOSPITAL_COMMUNITY): Payer: Self-pay

## 2020-01-16 ENCOUNTER — Other Ambulatory Visit: Payer: Self-pay

## 2020-01-16 ENCOUNTER — Telehealth: Payer: Self-pay | Admitting: Hematology and Oncology

## 2020-01-16 DIAGNOSIS — D509 Iron deficiency anemia, unspecified: Secondary | ICD-10-CM | POA: Insufficient documentation

## 2020-01-16 DIAGNOSIS — K219 Gastro-esophageal reflux disease without esophagitis: Secondary | ICD-10-CM | POA: Insufficient documentation

## 2020-01-16 DIAGNOSIS — I10 Essential (primary) hypertension: Secondary | ICD-10-CM | POA: Insufficient documentation

## 2020-01-16 DIAGNOSIS — D751 Secondary polycythemia: Secondary | ICD-10-CM | POA: Insufficient documentation

## 2020-01-16 DIAGNOSIS — R7989 Other specified abnormal findings of blood chemistry: Secondary | ICD-10-CM | POA: Diagnosis present

## 2020-01-16 DIAGNOSIS — Z79899 Other long term (current) drug therapy: Secondary | ICD-10-CM | POA: Diagnosis not present

## 2020-01-16 DIAGNOSIS — D75839 Thrombocytosis, unspecified: Secondary | ICD-10-CM

## 2020-01-16 DIAGNOSIS — Z1589 Genetic susceptibility to other disease: Secondary | ICD-10-CM

## 2020-01-16 DIAGNOSIS — Z7982 Long term (current) use of aspirin: Secondary | ICD-10-CM | POA: Insufficient documentation

## 2020-01-16 HISTORY — DX: Essential (primary) hypertension: I10

## 2020-01-16 LAB — CBC WITH DIFFERENTIAL/PLATELET
Abs Immature Granulocytes: 0.12 10*3/uL — ABNORMAL HIGH (ref 0.00–0.07)
Basophils Absolute: 0.3 10*3/uL — ABNORMAL HIGH (ref 0.0–0.1)
Basophils Relative: 3 %
Eosinophils Absolute: 0.9 10*3/uL — ABNORMAL HIGH (ref 0.0–0.5)
Eosinophils Relative: 9 %
HCT: 54.9 % — ABNORMAL HIGH (ref 36.0–46.0)
Hemoglobin: 16.3 g/dL — ABNORMAL HIGH (ref 12.0–15.0)
Immature Granulocytes: 1 %
Lymphocytes Relative: 19 %
Lymphs Abs: 2 10*3/uL (ref 0.7–4.0)
MCH: 20.6 pg — ABNORMAL LOW (ref 26.0–34.0)
MCHC: 29.7 g/dL — ABNORMAL LOW (ref 30.0–36.0)
MCV: 69.4 fL — ABNORMAL LOW (ref 80.0–100.0)
Monocytes Absolute: 1.1 10*3/uL — ABNORMAL HIGH (ref 0.1–1.0)
Monocytes Relative: 10 %
Neutro Abs: 6.1 10*3/uL (ref 1.7–7.7)
Neutrophils Relative %: 58 %
Platelets: 1044 10*3/uL (ref 150–400)
RBC: 7.91 MIL/uL — ABNORMAL HIGH (ref 3.87–5.11)
RDW: 22 % — ABNORMAL HIGH (ref 11.5–15.5)
WBC: 10.5 10*3/uL (ref 4.0–10.5)
nRBC: 0 % (ref 0.0–0.2)

## 2020-01-16 LAB — PROTIME-INR
INR: 1.1 (ref 0.8–1.2)
Prothrombin Time: 13.5 seconds (ref 11.4–15.2)

## 2020-01-16 MED ORDER — NALOXONE HCL 0.4 MG/ML IJ SOLN
INTRAMUSCULAR | Status: AC
  Filled 2020-01-16: qty 1

## 2020-01-16 MED ORDER — LIDOCAINE HCL (PF) 0.5 % IJ SOLN
INTRAMUSCULAR | Status: AC | PRN
  Administered 2020-01-16: 10 mL via INTRADERMAL

## 2020-01-16 MED ORDER — SODIUM CHLORIDE 0.9 % IV SOLN: INTRAVENOUS | Status: DC

## 2020-01-16 MED ORDER — MIDAZOLAM HCL 2 MG/2ML IJ SOLN
INTRAMUSCULAR | Status: AC
  Filled 2020-01-16: qty 2

## 2020-01-16 MED ORDER — FENTANYL CITRATE (PF) 100 MCG/2ML IJ SOLN
INTRAMUSCULAR | Status: AC
  Filled 2020-01-16: qty 2

## 2020-01-16 MED ORDER — MIDAZOLAM HCL 2 MG/2ML IJ SOLN
INTRAMUSCULAR | Status: AC | PRN
  Administered 2020-01-16 (×3): 1 mg via INTRAVENOUS

## 2020-01-16 MED ORDER — FLUMAZENIL 0.5 MG/5ML IV SOLN
INTRAVENOUS | Status: AC
  Filled 2020-01-16: qty 5

## 2020-01-16 MED ORDER — FENTANYL CITRATE (PF) 100 MCG/2ML IJ SOLN
INTRAMUSCULAR | Status: AC | PRN
  Administered 2020-01-16 (×2): 50 ug via INTRAVENOUS

## 2020-01-16 NOTE — Progress Notes (Signed)
CRITICAL VALUE ALERT  Critical Value:  platlet count 1044  Date & Time Notied:  01/16/2020  Provider Notified: notified per lab in which Rowe Robert PA with radiology informed  Orders Received/Actions taken:no orders given at present time

## 2020-01-16 NOTE — H&P (Signed)
Chief Complaint: Patient was seen in consultation today for bone marrow biopsy and aspiration  Referring Physician(s): Dorsey,John T IV  Supervising Physician: Jacqulynn Cadet  Patient Status: [facility] - Out-pt  History of Present Illness: Connie Myers is a 59 y.o. female with a medical history that includes hypertension, GERD, iron deficiency anemia and erythrocytosis/thrombocytosis. She was referred to hematology/oncology earlier this year for persistent alterations in her lab values.    Interventional Radiology has been asked to evaluate this patient for an image-guided bone marrow biopsy and aspiration for further work up and diagnosis.   Past Medical History:  Diagnosis Date  . Anemia   . Arthritis   . Complication of anesthesia    " I don't tolerate the caine's (Novacaine, Lidocaine etc. well " also, no codiene.   Marland Kitchen GERD (gastroesophageal reflux disease)   . Headache   . Hypertension   . Sleep apnea    PMH: " I don't have it now "  . Ventral hernia   . Wears glasses     Past Surgical History:  Procedure Laterality Date  . CESAREAN SECTION    . CHOLECYSTECTOMY    . COLONOSCOPY    . HERNIA REPAIR    . INSERTION OF MESH N/A 12/05/2017   Procedure: INSERTION OF MESH;  Surgeon: Donnie Mesa, MD;  Location: [place];  Service: General;  Laterality: N/A;  . VENTRAL HERNIA REPAIR  12/05/2017   OPEN VENTRAL HERNIA REPAIR ERAS PATHWAY w/mesh  . VENTRAL HERNIA REPAIR N/A 12/05/2017   Procedure: OPEN VENTRAL HERNIA REPAIR ERAS PATHWAY;  Surgeon: Donnie Mesa, MD;  Location: [place];  Service: General;  Laterality: N/A;  . VENTRAL HERNIA REPAIR  01/01/2019  . VENTRAL HERNIA REPAIR N/A 01/01/2019   Procedure: LAPAROSCOPIC VENTRAL HERNIA REPAIR WITH MESH;  Surgeon: Donnie Mesa, MD;  Location: [place];  Service: General;  Laterality: N/A;  . WISDOM TOOTH EXTRACTION    . WOUND EXPLORATION N/A 12/29/2017   Procedure: ABDOMINAL WOUND EXPLORATION;  Surgeon: Donnie Mesa,  MD;  Location: [place];  Service: General;  Laterality: N/A;  LMA    Allergies: Codeine and Local [lidocaine]  Medications: Prior to Admission medications   Medication Sig Start Date End Date Taking? Authorizing Provider  aspirin EC 81 MG tablet Take 81 mg by mouth daily. Swallow whole.   Yes [provider]  fexofenadine (ALLEGRA) 180 MG tablet Take 180 mg by mouth daily as needed for allergies.   Yes [provider]  hydrochlorothiazide (HYDRODIURIL) 25 MG tablet Take 1 tablet (25 mg total) by mouth daily. 10/25/19  Yes Petrucelli, Samantha R, PA-C  ibuprofen (ADVIL) 200 MG tablet Take 600 mg by mouth every 6 (six) hours as needed.   Yes [provider]  irbesartan (AVAPRO) 150 MG tablet Take 150 mg by mouth daily. 10/30/19  Yes [provider]  famotidine (PEPCID) 20 MG tablet Take 1 tablet (20 mg total) by mouth 2 (two) times daily. Patient not taking: Reported on 12/20/2018 06/08/18 10/25/19  Carmin Muskrat, MD     Family History  Problem Relation Age of Onset  . Breast cancer Mother     Social History   Socioeconomic History  . Marital status: Married    Spouse name: Not on file  . Number of children: Not on file  . Years of education: Not on file  . Highest education level: Not on file  Occupational History  . Not on file  Tobacco Use  . Smoking status: Never  Smoker  . Smokeless tobacco: Never Used  Vaping Use  . Vaping Use: Never used  Substance and Sexual Activity  . Alcohol use: Yes    Comment: occasionally  . Drug use: Not Currently  . Sexual activity: Not on file  Other Topics Concern  . Not on file  Social History Narrative  . Not on file   Social Determinants of Health   Financial Resource Strain:   . Difficulty of Paying Living Expenses:   Food Insecurity:   . Worried About Charity fundraiser in the Last Year:   . Arboriculturist in the Last Year:   Transportation Needs:   . Film/video editor (Medical):   Marland Kitchen  Lack of Transportation (Non-Medical):   Physical Activity:   . Days of Exercise per Week:   . Minutes of Exercise per Session:   Stress:   . Feeling of Stress :   Social Connections:   . Frequency of Communication with Friends and Family:   . Frequency of Social Gatherings with Friends and Family:   . Attends Religious Services:   . Active Member of Clubs or Organizations:   . Attends Archivist Meetings:   Marland Kitchen Marital Status:     Review of Systems: A 12 point ROS discussed and pertinent positives are indicated in the HPI above.  All other systems are negative.  Review of Systems  Constitutional: Negative for appetite change and fatigue.  Respiratory: Negative for cough and shortness of breath.   Cardiovascular: Negative for chest pain and leg swelling.  Gastrointestinal: Negative for abdominal pain, diarrhea, nausea and vomiting.  Musculoskeletal: Negative for back pain.  Neurological: Positive for headaches.  Hematological: Does not bruise/bleed easily.    Vital Signs: BP (!) 156/98   Pulse 72   Temp 98.4 F (36.9 C) (Oral)   Resp 16   SpO2 94%   Physical Exam Constitutional:      General: She is not in acute distress. HENT:     Mouth/Throat:     Mouth: Mucous membranes are moist.     Pharynx: Oropharynx is clear.  Cardiovascular:     Rate and Rhythm: Normal rate and regular rhythm.     Pulses: Normal pulses.     Heart sounds: Normal heart sounds.  Pulmonary:     Effort: Pulmonary effort is normal.     Breath sounds: Normal breath sounds.  Abdominal:     General: Bowel sounds are normal.     Palpations: Abdomen is soft.  Musculoskeletal:        General: Normal range of motion.  Skin:    General: Skin is warm and dry.  Neurological:     Mental Status: She is alert and oriented to person, place, and time.     Imaging: No results found.  Labs:  CBC: Recent Labs    10/25/19 1400 12/19/19 0940 01/16/20 0745  WBC 9.5 11.3* 10.5  HGB 15.8*  17.2* 16.3*  HCT 51.3* 55.4* 54.9*  PLT 976* 866* 1,044*    COAGS: Recent Labs    01/16/20 0745  INR 1.1    BMP: Recent Labs    10/25/19 1400 12/19/19 0940  NA 139 140  K 3.8 4.2  CL 104 105  CO2 25 24  GLUCOSE 97 100*  BUN 10 10  CALCIUM 8.7* 9.1  CREATININE 0.65 0.72  GFRNONAA >60 >60  GFRAA >60 >60    LIVER FUNCTION TESTS: Recent Labs    12/19/19 0940  BILITOT 0.6  AST 15  ALT 16  ALKPHOS 108  PROT 7.9  ALBUMIN 4.1    TUMOR MARKERS: No results for input(s): AFPTM, CEA, CA199, CHROMGRNA in the last 8760 hours.  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   Risks and benefits of a bone marrow biopsy and aspiration were discussed with the patient including, but not limited to bleeding, infection, damage to adjacent structures or low yield requiring additional tests.  All of the questions were answered and there is agreement to proceed.  Patient has been NPO, labs and vitals have been reviewed.   Consent signed and in chart.  Thank you for this interesting consult.  I greatly enjoyed meeting Connie Myers and look forward to participating in their care.  A copy of this report was sent to the requesting provider on this date.  Electronically Signed: Soyla Dryer, AGACNP-BC [phone] 01/16/2020, 8:55 AM   I spent a total of  30 Minutes   in face to face in clinical consultation, greater than 50% of which was counseling/coordinating care for image-guided bone marrow aspiration and biopsy.

## 2020-01-16 NOTE — Telephone Encounter (Signed)
Called pt per 7/29 sch msg - no answer . Left message for patient to call back if reschedule is still needed

## 2020-01-16 NOTE — Procedures (Signed)
Interventional Radiology Procedure Note  Procedure: CT guided aspirate and core biopsy of right iliac bone Complications: None EBL: None Recommendations: - Bedrest supine x 1 hrs - Hydrocodone PRN  Pain - Follow biopsy results  Signed,  Amado Andal K. Thadius Smisek, MD   

## 2020-01-16 NOTE — Discharge Instructions (Signed)
Moderate Conscious Sedation, Adult, Care After These instructions provide you with information about caring for yourself after your procedure. Your health care provider may also give you more specific instructions. Your treatment has been planned according to current medical practices, but problems sometimes occur. Call your health care provider if you have any problems or questions after your procedure. What can I expect after the procedure? After your procedure, it is common:  To feel sleepy for several hours.  To feel clumsy and have poor balance for several hours.  To have poor judgment for several hours.  To vomit if you eat too soon. Follow these instructions at home: For at least 24 hours after the procedure:   Do not: ? Participate in activities where you could fall or become injured. ? Drive. ? Use heavy machinery. ? Drink alcohol. ? Take sleeping pills or medicines that cause drowsiness. ? Make important decisions or sign legal documents. ? Take care of children on your own.  Rest. Eating and drinking  Follow the diet recommended by your health care provider.  If you vomit: ? Drink water, juice, or soup when you can drink without vomiting. ? Make sure you have little or no nausea before eating solid foods. General instructions  Have a responsible adult stay with you until you are awake and alert.  Take over-the-counter and prescription medicines only as told by your health care provider.  If you smoke, do not smoke without supervision.  Keep all follow-up visits as told by your health care provider. This is important. Contact a health care provider if:  You keep feeling nauseous or you keep vomiting.  You feel light-headed.  You develop a rash.  You have a fever. Get help right away if:  You have trouble breathing. This information is not intended to replace advice given to you by your health care provider. Make sure you discuss any questions you have  with your health care provider. Document Revised: 05/19/2017 Document Reviewed: 09/26/2015 Elsevier Patient Education  2020 Elsevier Inc. Needle Biopsy, Care After These instructions tell you how to care for yourself after your procedure. Your doctor may also give you more specific instructions. Call your doctor if you have any problems or questions. What can I expect after the procedure? After the procedure, it is common to have:  Soreness.  Bruising.  Mild pain. Follow these instructions at home:   Return to your normal activities as told by your doctor. Ask your doctor what activities are safe for you.  Take over-the-counter and prescription medicines only as told by your doctor.  Wash your hands with soap and water before you change your bandage (dressing). If you cannot use soap and water, use hand sanitizer.  Follow instructions from your doctor about: ? How to take care of your puncture site. ? When and how to change your bandage. ? When to remove your bandage.  Check your puncture site every day for signs of infection. Watch for: ? Redness, swelling, or pain. ? Fluid or blood. ? Pus or a bad smell. ? Warmth.  Do not take baths, swim, or use a hot tub until your doctor approves. Ask your doctor if you may take showers. You may only be allowed to take sponge baths.  Keep all follow-up visits as told by your doctor. This is important. Contact a doctor if you have:  A fever.  Redness, swelling, or pain at the puncture site, and it lasts longer than a few days.  Fluid,   blood, or pus coming from the puncture site.  Warmth coming from the puncture site. Get help right away if:  You have a lot of bleeding from the puncture site. Summary  After the procedure, it is common to have soreness, bruising, or mild pain at the puncture site.  Check your puncture site every day for signs of infection, such as redness, swelling, or pain.  Get help right away if you have  severe bleeding from your puncture site. This information is not intended to replace advice given to you by your health care provider. Make sure you discuss any questions you have with your health care provider. Document Revised: 06/19/2017 Document Reviewed: 06/19/2017 Elsevier Patient Education  2020 Elsevier Inc.  

## 2020-01-22 ENCOUNTER — Telehealth: Payer: Self-pay | Admitting: Hematology and Oncology

## 2020-01-22 NOTE — Telephone Encounter (Signed)
R/s appt per 8/4 sch msg - unable to reach pt. Left message with apt date and time

## 2020-01-27 ENCOUNTER — Encounter (HOSPITAL_COMMUNITY): Payer: Self-pay | Admitting: Hematology and Oncology

## 2020-01-28 LAB — SURGICAL PATHOLOGY

## 2020-01-29 ENCOUNTER — Encounter: Payer: Self-pay | Admitting: Hematology and Oncology

## 2020-01-29 ENCOUNTER — Inpatient Hospital Stay (HOSPITAL_BASED_OUTPATIENT_CLINIC_OR_DEPARTMENT_OTHER): Payer: No Typology Code available for payment source | Admitting: Hematology and Oncology

## 2020-01-29 ENCOUNTER — Inpatient Hospital Stay: Payer: No Typology Code available for payment source | Attending: Hematology and Oncology

## 2020-01-29 ENCOUNTER — Other Ambulatory Visit: Payer: Self-pay | Admitting: Hematology and Oncology

## 2020-01-29 ENCOUNTER — Other Ambulatory Visit: Payer: Self-pay

## 2020-01-29 VITALS — BP 161/79 | HR 67 | Temp 98.4°F | Resp 20 | Ht 69.0 in | Wt 242.0 lb

## 2020-01-29 DIAGNOSIS — D45 Polycythemia vera: Secondary | ICD-10-CM | POA: Diagnosis not present

## 2020-01-29 DIAGNOSIS — Z803 Family history of malignant neoplasm of breast: Secondary | ICD-10-CM | POA: Insufficient documentation

## 2020-01-29 DIAGNOSIS — D473 Essential (hemorrhagic) thrombocythemia: Secondary | ICD-10-CM | POA: Diagnosis not present

## 2020-01-29 DIAGNOSIS — Z885 Allergy status to narcotic agent status: Secondary | ICD-10-CM | POA: Diagnosis not present

## 2020-01-29 DIAGNOSIS — Z7982 Long term (current) use of aspirin: Secondary | ICD-10-CM | POA: Insufficient documentation

## 2020-01-29 DIAGNOSIS — D751 Secondary polycythemia: Secondary | ICD-10-CM | POA: Insufficient documentation

## 2020-01-29 DIAGNOSIS — Z1589 Genetic susceptibility to other disease: Secondary | ICD-10-CM

## 2020-01-29 DIAGNOSIS — D75839 Thrombocytosis, unspecified: Secondary | ICD-10-CM

## 2020-01-29 LAB — CBC WITH DIFFERENTIAL (CANCER CENTER ONLY)
Abs Immature Granulocytes: 0.1 10*3/uL — ABNORMAL HIGH (ref 0.00–0.07)
Basophils Absolute: 0.3 10*3/uL — ABNORMAL HIGH (ref 0.0–0.1)
Basophils Relative: 3 %
Eosinophils Absolute: 1 10*3/uL — ABNORMAL HIGH (ref 0.0–0.5)
Eosinophils Relative: 8 %
HCT: 54.1 % — ABNORMAL HIGH (ref 36.0–46.0)
Hemoglobin: 16.7 g/dL — ABNORMAL HIGH (ref 12.0–15.0)
Immature Granulocytes: 1 %
Lymphocytes Relative: 16 %
Lymphs Abs: 1.9 10*3/uL (ref 0.7–4.0)
MCH: 21.1 pg — ABNORMAL LOW (ref 26.0–34.0)
MCHC: 30.9 g/dL (ref 30.0–36.0)
MCV: 68.2 fL — ABNORMAL LOW (ref 80.0–100.0)
Monocytes Absolute: 1.3 10*3/uL — ABNORMAL HIGH (ref 0.1–1.0)
Monocytes Relative: 11 %
Neutro Abs: 7.3 10*3/uL (ref 1.7–7.7)
Neutrophils Relative %: 61 %
Platelet Count: 1020 10*3/uL (ref 150–400)
RBC: 7.93 MIL/uL — ABNORMAL HIGH (ref 3.87–5.11)
RDW: 21.7 % — ABNORMAL HIGH (ref 11.5–15.5)
WBC Count: 12 10*3/uL — ABNORMAL HIGH (ref 4.0–10.5)
nRBC: 0 % (ref 0.0–0.2)

## 2020-01-29 LAB — CMP (CANCER CENTER ONLY)
ALT: 14 U/L (ref 0–44)
AST: 15 U/L (ref 15–41)
Albumin: 3.9 g/dL (ref 3.5–5.0)
Alkaline Phosphatase: 102 U/L (ref 38–126)
Anion gap: 9 (ref 5–15)
BUN: 12 mg/dL (ref 6–20)
CO2: 26 mmol/L (ref 22–32)
Calcium: 9.5 mg/dL (ref 8.9–10.3)
Chloride: 105 mmol/L (ref 98–111)
Creatinine: 0.73 mg/dL (ref 0.44–1.00)
GFR, Est AFR Am: >60 mL/min (ref >60)
GFR, Estimated: >60 mL/min (ref >60)
Glucose, Bld: 93 mg/dL (ref 70–99)
Potassium: 4.1 mmol/L (ref 3.5–5.1)
Sodium: 140 mmol/L (ref 135–145)
Total Bilirubin: 0.8 mg/dL (ref 0.3–1.2)
Total Protein: 7.5 g/dL (ref 6.5–8.1)

## 2020-01-29 MED ORDER — HYDROXYUREA 500 MG PO CAPS
500.0000 mg | ORAL_CAPSULE | Freq: Every day | ORAL | 1 refills | Status: DC
Start: 2020-01-29 — End: 2020-02-17

## 2020-01-29 NOTE — Progress Notes (Signed)
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 First CBC on record 2) 12/29/2017: WBC 14.1, Hgb 15.0, MCV 72.1, Plt 1074 3) 12/28/2018: WBC 10.9, Hgb 16.3, MCV 67.8, Hgb 982 4) 10/25/2019: WBC 9.5, Hgb 15.8, MCV 67.9, Plt 976 5) 12/19/2019: establish care with Dr. Lorenso Courier. JAK2 V617F mutation returned as positive. Patient already on ASA 68m PO daily.  6) 01/16/2020: bone marrow biopsy confirms hypercellular marrow consistent with polycythemia vera. Start hydroxyurea 500 mg PO daily.   Interval History:  Connie SULTON59y.o. female with medical history significant for JAK2 positive PV who presents for a follow up visit. The patient's last visit was on 12/19/2019 at which time she established care. In the interim since the last visit she has had positive JAK2 testing and a bone marrow biopsy to confirm the diagnosis of polycythemia vera.   On exam today Ms. Cardwell is accompanied by her husband.  She reports that she has not been having any fevers, chills, sweats, nausea, vomiting, or diarrhea.  She notes that she has had occasional headaches typically on the right side of her head.  She notes that she is also having some tightness in her jaw which followed a root canal procedure.  She was told to apply hot compresses to this, but she has not noticed much in the way of improvement.  She denies having any issues with bleeding, bruising, or dark stools.  A full 10 point ROS is listed below.  The bulk of our discussion today focused on the diagnosis of polycythemia vera, the treatment options, and what to expect moving forward.  This discussion is detailed below in the assessment.  MEDICAL HISTORY:  Past Medical  History:  Diagnosis Date  . Anemia   . Arthritis   . Complication of anesthesia    " I don't tolerate the caine's (Novacaine, Lidocaine etc. well " also, no codiene.   .Marland KitchenGERD (gastroesophageal reflux disease)   . Headache   . Hypertension   . Sleep apnea    PMH: " I don't have it now "  . Ventral hernia   . Wears glasses     SURGICAL HISTORY: Past Surgical History:  Procedure Laterality Date  . CESAREAN SECTION    . CHOLECYSTECTOMY    . COLONOSCOPY    . HERNIA REPAIR    . INSERTION OF MESH N/A 12/05/2017   Procedure: INSERTION OF MESH;  Surgeon: TDonnie Mesa MD;  [place] [facility]  Service: General;  Laterality: N/A;  . VENTRAL HERNIA REPAIR  12/05/2017   OPEN VENTRAL HERNIA REPAIR ERAS PATHWAY w/mesh  . VENTRAL HERNIA REPAIR N/A 12/05/2017   Procedure: OPEN VENTRAL HERNIA REPAIR ERAS PATHWAY;  Surgeon: TDonnie Mesa MD;  [place] [facility]  Service: General;  Laterality: N/A;  . VENTRAL HERNIA REPAIR  01/01/2019  . VENTRAL HERNIA REPAIR N/A 01/01/2019   Procedure: LAPAROSCOPIC VENTRAL HERNIA REPAIR WITH MESH;  Surgeon: TDonnie Mesa MD;  [place] [facility]  Service: General;  Laterality: N/A;  . WISDOM TOOTH EXTRACTION    . WOUND EXPLORATION N/A 12/29/2017   Procedure: ABDOMINAL WOUND EXPLORATION;  Surgeon: TDonnie Mesa MD;  [place] [facility]  Service: General;  Laterality: N/A;  LMA    SOCIAL HISTORY: Social History  Socioeconomic History  . Marital status: Married    Spouse name: Not on file  . Number of children: Not on file  . Years of education: Not on file  . Highest education level: Not on file  Occupational History  . Not on file  Tobacco Use  . Smoking status: Never Smoker  . Smokeless tobacco: Never Used  Vaping Use  . Vaping Use: Never used  Substance and Sexual Activity  . Alcohol use: Yes    Comment: occasionally  . Drug use: Not Currently  . Sexual activity: Not on file  Other Topics Concern  . Not on file  Social History Narrative  . Not on  file   Social Determinants of Health   Financial Resource Strain:   . Difficulty of Paying Living Expenses:   Food Insecurity:   . Worried About Charity fundraiser in the Last Year:   . Arboriculturist in the Last Year:   Transportation Needs:   . Film/video editor (Medical):   Marland Kitchen Lack of Transportation (Non-Medical):   Physical Activity:   . Days of Exercise per Week:   . Minutes of Exercise per Session:   Stress:   . Feeling of Stress :   Social Connections:   . Frequency of Communication with Friends and Family:   . Frequency of Social Gatherings with Friends and Family:   . Attends Religious Services:   . Active Member of Clubs or Organizations:   . Attends Archivist Meetings:   Marland Kitchen Marital Status:   Intimate Partner Violence:   . Fear of Current or Ex-Partner:   . Emotionally Abused:   Marland Kitchen Physically Abused:   . Sexually Abused:     FAMILY HISTORY: Family History  Problem Relation Age of Onset  . Breast cancer Mother     ALLERGIES:  is allergic to codeine and local [lidocaine].  MEDICATIONS:  Current Outpatient Medications  Medication Sig Dispense Refill  . aspirin EC 81 MG tablet Take 81 mg by mouth daily. Swallow whole.    . fexofenadine (ALLEGRA) 180 MG tablet Take 180 mg by mouth daily as needed for allergies.    . hydrochlorothiazide (HYDRODIURIL) 25 MG tablet Take 1 tablet (25 mg total) by mouth daily. 30 tablet 0  . hydroxyurea (HYDREA) 500 MG capsule Take 1 capsule (500 mg total) by mouth daily. May take with food to minimize GI side effects. 90 capsule 1  . ibuprofen (ADVIL) 200 MG tablet Take 600 mg by mouth every 6 (six) hours as needed.    . irbesartan (AVAPRO) 150 MG tablet Take 150 mg by mouth daily.     No current facility-administered medications for this visit.    REVIEW OF SYSTEMS:   Constitutional: ( - ) fevers, ( - )  chills , ( - ) night sweats Eyes: ( - ) blurriness of vision, ( - ) double vision, ( - ) watery eyes Ears,  nose, mouth, throat, and face: ( - ) mucositis, ( - ) sore throat Respiratory: ( - ) cough, ( - ) dyspnea, ( - ) wheezes Cardiovascular: ( - ) palpitation, ( - ) chest discomfort, ( - ) lower extremity swelling Gastrointestinal:  ( - ) nausea, ( - ) heartburn, ( - ) change in bowel habits Skin: ( - ) abnormal skin rashes Lymphatics: ( - ) new lymphadenopathy, ( - ) easy bruising Neurological: ( - ) numbness, ( - ) tingling, ( - ) new weaknesses Behavioral/Psych: ( - )  mood change, ( - ) new changes  All other systems were reviewed with the patient and are negative.  PHYSICAL EXAMINATION: ECOG PERFORMANCE STATUS: 0 - Asymptomatic  Vitals:   01/29/20 0959  BP: (!) 161/79  Pulse: 67  Resp: 20  Temp: 98.4 F (36.9 C)  SpO2: 98%   Filed Weights   01/29/20 0959  Weight: 242 lb (109.8 kg)    GENERAL: well appearing middle aged Caucasian female. alert, no distress and comfortable SKIN: skin color, texture, turgor are normal, no rashes or significant lesions EYES: conjunctiva are pink and non-injected, sclera clear LUNGS: clear to auscultation and percussion with normal breathing effort HEART: regular rate & rhythm and no murmurs and no lower extremity edema Musculoskeletal: no cyanosis of digits and no clubbing  PSYCH: alert & oriented x 3, fluent speech NEURO: no focal motor/sensory deficits  LABORATORY DATA:  I have reviewed the data as listed CBC Latest Ref Rng & Units 01/29/2020 01/16/2020 12/19/2019  WBC 4.0 - 10.5 K/uL 12.0(H) 10.5 11.3(H)  Hemoglobin 12.0 - 15.0 g/dL 16.7(H) 16.3(H) 17.2(H)  Hematocrit 36 - 46 % 54.1(H) 54.9(H) 55.4(H)  Platelets 150 - 400 K/uL 1,020(HH) 1,044(HH) 866(H)    CMP Latest Ref Rng & Units 01/29/2020 12/19/2019 10/25/2019  Glucose 70 - 99 mg/dL 93 100(H) 97  BUN 6 - 20 mg/dL '12 10 10  ' Creatinine 0.44 - 1.00 mg/dL 0.73 0.72 0.65  Sodium 135 - 145 mmol/L 140 140 139  Potassium 3.5 - 5.1 mmol/L 4.1 4.2 3.8  Chloride 98 - 111 mmol/L 105 105 104  CO2  22 - 32 mmol/L '26 24 25  ' Calcium 8.9 - 10.3 mg/dL 9.5 9.1 8.7(L)  Total Protein 6.5 - 8.1 g/dL 7.5 7.9 -  Total Bilirubin 0.3 - 1.2 mg/dL 0.8 0.6 -  Alkaline Phos 38 - 126 U/L 102 108 -  AST 15 - 41 U/L 15 15 -  ALT 0 - 44 U/L 14 16 -    RADIOGRAPHIC STUDIES:  CT Biopsy  Result Date: 01/16/2020 INDICATION: 59 year old female with a history arrhythmia slight ptosis and thrombocytosis. She presents for CT-guided bone marrow biopsy. EXAM: CT GUIDED BONE MARROW ASPIRATION AND CORE BIOPSY Interventional Radiologist:  Criselda Peaches, MD MEDICATIONS: None. ANESTHESIA/SEDATION: Moderate (conscious) sedation was employed during this procedure. A total of 3 milligrams versed and 100 micrograms fentanyl were administered intravenously. The patient's level of consciousness and vital signs were monitored continuously by radiology nursing throughout the procedure under my direct supervision. Total monitored sedation time: 13 minutes FLUOROSCOPY TIME:  None. COMPLICATIONS: None immediate. Estimated blood loss: <25 mL PROCEDURE: Informed written consent was obtained from the patient after a thorough discussion of the procedural risks, benefits and alternatives. All questions were addressed. Maximal Sterile Barrier Technique was utilized including caps, mask, sterile gowns, sterile gloves, sterile drape, hand hygiene and skin antiseptic. A timeout was performed prior to the initiation of the procedure. The patient was positioned prone and non-contrast localization CT was performed of the pelvis to demonstrate the iliac marrow spaces. Maximal barrier sterile technique utilized including caps, mask, sterile gowns, sterile gloves, large sterile drape, hand hygiene, and betadine prep. Under sterile conditions and local anesthesia, an 11 gauge coaxial bone biopsy needle was advanced into the right iliac marrow space. Needle position was confirmed with CT imaging. Initially, bone marrow aspiration was performed. Next,  the 11 gauge outer cannula was utilized to obtain a right iliac bone marrow core biopsy. Needle was removed. Hemostasis was obtained with compression.  The patient tolerated the procedure well. Samples were prepared with the cytotechnologist. IMPRESSION: Technically successful CT-guided bone marrow aspiration and core biopsy of the right iliac bone. Electronically Signed   By: Jacqulynn Cadet M.D.   On: 01/16/2020 12:55   CT BONE MARROW BIOPSY & ASPIRATION  Result Date: 01/16/2020 INDICATION: 58 year old female with a history arrhythmia slight ptosis and thrombocytosis. She presents for CT-guided bone marrow biopsy. EXAM: CT GUIDED BONE MARROW ASPIRATION AND CORE BIOPSY Interventional Radiologist:  Criselda Peaches, MD MEDICATIONS: None. ANESTHESIA/SEDATION: Moderate (conscious) sedation was employed during this procedure. A total of 3 milligrams versed and 100 micrograms fentanyl were administered intravenously. The patient's level of consciousness and vital signs were monitored continuously by radiology nursing throughout the procedure under my direct supervision. Total monitored sedation time: 13 minutes FLUOROSCOPY TIME:  None. COMPLICATIONS: None immediate. Estimated blood loss: <25 mL PROCEDURE: Informed written consent was obtained from the patient after a thorough discussion of the procedural risks, benefits and alternatives. All questions were addressed. Maximal Sterile Barrier Technique was utilized including caps, mask, sterile gowns, sterile gloves, sterile drape, hand hygiene and skin antiseptic. A timeout was performed prior to the initiation of the procedure. The patient was positioned prone and non-contrast localization CT was performed of the pelvis to demonstrate the iliac marrow spaces. Maximal barrier sterile technique utilized including caps, mask, sterile gowns, sterile gloves, large sterile drape, hand hygiene, and betadine prep. Under sterile conditions and local anesthesia, an 11  gauge coaxial bone biopsy needle was advanced into the right iliac marrow space. Needle position was confirmed with CT imaging. Initially, bone marrow aspiration was performed. Next, the 11 gauge outer cannula was utilized to obtain a right iliac bone marrow core biopsy. Needle was removed. Hemostasis was obtained with compression. The patient tolerated the procedure well. Samples were prepared with the cytotechnologist. IMPRESSION: Technically successful CT-guided bone marrow aspiration and core biopsy of the right iliac bone. Electronically Signed   By: Jacqulynn Cadet M.D.   On: 01/16/2020 12:55    ASSESSMENT & PLAN Connie Myers 59 y.o. female with medical history significant for JAK2 positive PV who presents for a follow up visit. After review of the labs, discussion with the patient and review of the prior records her findings are most consistent with polycythemia vera.  The findings show that the patient has iron deficiency, low EPO levels, and marked erythrocytosis and thrombocytosis.  It is most likely that the patient has polycythemia vera which is caused an overproduction of red blood cells resulting in decreased iron stores and subsequent development of thrombocytosis.  The treatments at this time are designed to decrease her risk of thrombosis.  As such we will continue her on 81 mg ASA daily and will add hydroxyurea 500 mg p.o. daily as cytoreductive therapy.  Additionally in order to drop her levels further we will have a target hematocrit of 42%.  For this we will be performing phlebotomy to drop her levels to target.  Today we also discussed the risk of developing acute leukemia as well as myelofibrosis.  We will start phlebotomies as soon as we can and the hydroxyurea prescription has already been prescribed.  We will plan to see her back in approximately 1 month's time in order to evaluate how she is tolerating therapy.  # Polycythemia Vera, JAK2 Positive # Thrombocytosis 2/2 to  Iron Deficiency --continue ASA 50m PO daily for thromboprophylaxis --prescribe hydroxyurea 5055mPO daily for cytoreductive therapy --will begin phlebotomies, target HCT  42% (currently 54.1%) Plan for q 2 week phlebotomies until our goal is reached. --avoid iron supplementation/iron rich foods as our goal is to decrease the bodies iron supply. --discussed risk for development of acute leukemia/myelofibrosis --RTC in 1 month after the start of phlebotomies.   No orders of the defined types were placed in this encounter.   All questions were answered. The patient knows to call the clinic with any problems, questions or concerns.  A total of more than 40 minutes were spent on this encounter and over half of that time was spent on counseling and coordination of care as outlined above.   [name], MD Department of Hematology/Oncology [place] [facility] Phone: [phone] Pager: [phone] Email: [email or web address]  01/29/2020 4:03 PM

## 2020-01-30 ENCOUNTER — Telehealth: Payer: Self-pay | Admitting: Hematology and Oncology

## 2020-01-30 ENCOUNTER — Ambulatory Visit: Payer: No Typology Code available for payment source | Admitting: Hematology and Oncology

## 2020-01-30 ENCOUNTER — Other Ambulatory Visit: Payer: No Typology Code available for payment source

## 2020-01-30 NOTE — Telephone Encounter (Signed)
Scheduled per los. Called and left msg. Mailed printout  °

## 2020-02-03 ENCOUNTER — Inpatient Hospital Stay: Payer: No Typology Code available for payment source

## 2020-02-03 ENCOUNTER — Other Ambulatory Visit: Payer: Self-pay

## 2020-02-03 VITALS — BP 136/78 | HR 72 | Temp 98.5°F | Resp 18

## 2020-02-03 DIAGNOSIS — D45 Polycythemia vera: Secondary | ICD-10-CM

## 2020-02-03 DIAGNOSIS — D473 Essential (hemorrhagic) thrombocythemia: Secondary | ICD-10-CM | POA: Diagnosis not present

## 2020-02-03 LAB — CBC WITH DIFFERENTIAL (CANCER CENTER ONLY)
Abs Immature Granulocytes: 0.1 10*3/uL — ABNORMAL HIGH (ref 0.00–0.07)
Basophils Absolute: 0.3 10*3/uL — ABNORMAL HIGH (ref 0.0–0.1)
Basophils Relative: 3 %
Eosinophils Absolute: 0.9 10*3/uL — ABNORMAL HIGH (ref 0.0–0.5)
Eosinophils Relative: 8 %
HCT: 55.2 % — ABNORMAL HIGH (ref 36.0–46.0)
Hemoglobin: 17.1 g/dL — ABNORMAL HIGH (ref 12.0–15.0)
Immature Granulocytes: 1 %
Lymphocytes Relative: 18 %
Lymphs Abs: 1.9 10*3/uL (ref 0.7–4.0)
MCH: 21.2 pg — ABNORMAL LOW (ref 26.0–34.0)
MCHC: 31 g/dL (ref 30.0–36.0)
MCV: 68.4 fL — ABNORMAL LOW (ref 80.0–100.0)
Monocytes Absolute: 1 10*3/uL (ref 0.1–1.0)
Monocytes Relative: 9 %
Neutro Abs: 6.6 10*3/uL (ref 1.7–7.7)
Neutrophils Relative %: 61 %
Platelet Count: 1048 10*3/uL (ref 150–400)
RBC: 8.07 MIL/uL — ABNORMAL HIGH (ref 3.87–5.11)
RDW: 22 % — ABNORMAL HIGH (ref 11.5–15.5)
WBC Count: 10.7 10*3/uL — ABNORMAL HIGH (ref 4.0–10.5)
nRBC: 0 % (ref 0.0–0.2)

## 2020-02-03 NOTE — Patient Instructions (Signed)

## 2020-02-03 NOTE — Progress Notes (Signed)
Connie Myers presents today for phlebotomy per MD orders. Phlebotomy procedure started at 0954 w/ 16 G phlebotomy set in L ACand ended at 1000. 538 grams removed. Patient observed for 30 minutes after procedure without any incident. Patient tolerated procedure well. IV needle removed intact.

## 2020-02-05 ENCOUNTER — Telehealth: Payer: Self-pay | Admitting: Hematology and Oncology

## 2020-02-05 ENCOUNTER — Telehealth: Payer: Self-pay | Admitting: *Deleted

## 2020-02-05 NOTE — Telephone Encounter (Signed)
Received vm message from pt requesting to know the cost of her upcoming Phlebotomy (02/17/20), how much her insurance will pay and if there was any financial assistance. TCT back to her.  No answer but was able to leave vm message for pt to call billing and then financial assistance. Provided phone 3s for her.

## 2020-02-05 NOTE — Telephone Encounter (Signed)
Scheduled appt per 8/18 sch msg - left message for patient with appt date and time

## 2020-02-13 ENCOUNTER — Encounter: Payer: Self-pay | Admitting: Hematology and Oncology

## 2020-02-13 NOTE — Progress Notes (Signed)
Patient left voicemail requesting information regarding treatment cost/assistance.  Reached out to [place] in coding to find out cost of procedure.  Called patient to provide cost provided by coder and advised that only includes the procedure itself and not any other cost such as facility fee, administration and any other supportive meds. Advised more information would be known after first treatment. Discussed reaching out to insurance for specific details related to her plan such as deductible, out of pocket, and co-insurance. I advised her that itemized bills would be provided through the billing department and their number would be on the bill when she receives.  Advised there are no copay assistance programs for her diagnosis/treatment. Discussed one-time $1000 Radio broadcast assistant to assist with personal expenses while going through treatment and provided income guidelines. Patient states she thinks she is over the income but would have to take a look further.   Will have registration give her my card for contact information at her next visit.

## 2020-02-17 ENCOUNTER — Inpatient Hospital Stay: Payer: No Typology Code available for payment source

## 2020-02-17 ENCOUNTER — Other Ambulatory Visit: Payer: Self-pay | Admitting: *Deleted

## 2020-02-17 ENCOUNTER — Other Ambulatory Visit: Payer: No Typology Code available for payment source

## 2020-02-17 ENCOUNTER — Encounter: Payer: Self-pay | Admitting: Hematology & Oncology

## 2020-02-17 ENCOUNTER — Telehealth: Payer: Self-pay | Admitting: *Deleted

## 2020-02-17 ENCOUNTER — Other Ambulatory Visit: Payer: Self-pay

## 2020-02-17 ENCOUNTER — Inpatient Hospital Stay (HOSPITAL_BASED_OUTPATIENT_CLINIC_OR_DEPARTMENT_OTHER): Payer: No Typology Code available for payment source | Admitting: Hematology & Oncology

## 2020-02-17 VITALS — BP 153/88 | HR 73 | Temp 98.2°F | Resp 18 | Wt 240.0 lb

## 2020-02-17 DIAGNOSIS — D45 Polycythemia vera: Secondary | ICD-10-CM | POA: Diagnosis not present

## 2020-02-17 DIAGNOSIS — D473 Essential (hemorrhagic) thrombocythemia: Secondary | ICD-10-CM | POA: Diagnosis not present

## 2020-02-17 LAB — CMP (CANCER CENTER ONLY)
ALT: 12 U/L (ref 0–44)
AST: 12 U/L — ABNORMAL LOW (ref 15–41)
Albumin: 4.2 g/dL (ref 3.5–5.0)
Alkaline Phosphatase: 93 U/L (ref 38–126)
Anion gap: 8 (ref 5–15)
BUN: 13 mg/dL (ref 6–20)
CO2: 26 mmol/L (ref 22–32)
Calcium: 9.3 mg/dL (ref 8.9–10.3)
Chloride: 106 mmol/L (ref 98–111)
Creatinine: 0.63 mg/dL (ref 0.44–1.00)
GFR, Est AFR Am: >60 mL/min (ref >60)
GFR, Estimated: >60 mL/min (ref >60)
Glucose, Bld: 105 mg/dL — ABNORMAL HIGH (ref 70–99)
Potassium: 4 mmol/L (ref 3.5–5.1)
Sodium: 140 mmol/L (ref 135–145)
Total Bilirubin: 0.8 mg/dL (ref 0.3–1.2)
Total Protein: 7.3 g/dL (ref 6.5–8.1)

## 2020-02-17 LAB — CBC WITH DIFFERENTIAL (CANCER CENTER ONLY)
Abs Immature Granulocytes: 0.07 10*3/uL (ref 0.00–0.07)
Basophils Absolute: 0.2 10*3/uL — ABNORMAL HIGH (ref 0.0–0.1)
Basophils Relative: 2 %
Eosinophils Absolute: 0.8 10*3/uL — ABNORMAL HIGH (ref 0.0–0.5)
Eosinophils Relative: 8 %
HCT: 51.7 % — ABNORMAL HIGH (ref 36.0–46.0)
Hemoglobin: 16 g/dL — ABNORMAL HIGH (ref 12.0–15.0)
Immature Granulocytes: 1 %
Lymphocytes Relative: 18 %
Lymphs Abs: 1.8 10*3/uL (ref 0.7–4.0)
MCH: 21.4 pg — ABNORMAL LOW (ref 26.0–34.0)
MCHC: 30.9 g/dL (ref 30.0–36.0)
MCV: 69.3 fL — ABNORMAL LOW (ref 80.0–100.0)
Monocytes Absolute: 0.8 10*3/uL (ref 0.1–1.0)
Monocytes Relative: 8 %
Neutro Abs: 6.5 10*3/uL (ref 1.7–7.7)
Neutrophils Relative %: 63 %
Platelet Count: 977 10*3/uL (ref 150–400)
RBC: 7.46 MIL/uL — ABNORMAL HIGH (ref 3.87–5.11)
RDW: 21.9 % — ABNORMAL HIGH (ref 11.5–15.5)
WBC Count: 10.1 10*3/uL (ref 4.0–10.5)
nRBC: 0 % (ref 0.0–0.2)

## 2020-02-17 LAB — IRON AND TIBC
Iron: 20 ug/dL — ABNORMAL LOW (ref 41–142)
Saturation Ratios: 5 % — ABNORMAL LOW (ref 21–57)
TIBC: 366 ug/dL (ref 236–444)
UIBC: 347 ug/dL (ref 120–384)

## 2020-02-17 LAB — FERRITIN: Ferritin: 13 ng/mL (ref 11–307)

## 2020-02-17 LAB — LACTATE DEHYDROGENASE: LDH: 279 U/L — ABNORMAL HIGH (ref 98–192)

## 2020-02-17 LAB — SAVE SMEAR(SSMR), FOR PROVIDER SLIDE REVIEW

## 2020-02-17 MED ORDER — HYDROXYUREA 500 MG PO CAPS
500.0000 mg | ORAL_CAPSULE | Freq: Every day | ORAL | 1 refills | Status: DC
Start: 2020-02-17 — End: 2020-09-16

## 2020-02-17 NOTE — Telephone Encounter (Signed)
Dr. [name] notified of platelet count-977.  No new orders received at this time.

## 2020-02-17 NOTE — Patient Instructions (Signed)

## 2020-02-18 ENCOUNTER — Telehealth: Payer: Self-pay | Admitting: Hematology & Oncology

## 2020-02-18 LAB — VON WILLEBRAND PANEL
Coagulation Factor VIII: 97 % (ref 56–140)
Ristocetin Co-factor, Plasma: 38 % — ABNORMAL LOW (ref 50–200)
Von Willebrand Antigen, Plasma: 73 % (ref 50–200)

## 2020-02-18 LAB — COAG STUDIES INTERP REPORT

## 2020-02-18 NOTE — Telephone Encounter (Signed)
No los 8/30

## 2020-02-19 ENCOUNTER — Inpatient Hospital Stay: Payer: No Typology Code available for payment source

## 2020-02-19 ENCOUNTER — Telehealth: Payer: Self-pay | Admitting: Hematology and Oncology

## 2020-02-19 NOTE — Telephone Encounter (Signed)
Called the patient due to her No Showing her 730 lab and her 815 phlebotomy. Patient did not answer her phone. Message was left informing her that she needs to R/S those appts.   Connie Myers

## 2020-02-20 ENCOUNTER — Telehealth: Payer: Self-pay | Admitting: *Deleted

## 2020-02-20 ENCOUNTER — Encounter: Payer: Self-pay | Admitting: *Deleted

## 2020-02-20 NOTE — Telephone Encounter (Signed)
FMLA request due by 02/24/2020.  Messages left requesting type of leave.  [place] Provider advised to send request to [place] for current provider.  Patient requested transfer of care.    Connected with [employer] [id number]).  "Intermittent leave needed at 2 week intervals over four hour duration."  Provided above information.  Forms for intermittent leave faxed to [place] at this time.

## 2020-02-22 NOTE — Progress Notes (Signed)
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 First CBC on record 2) 12/29/2017: WBC 14.1, Hgb 15.0, MCV 72.1, Plt 1074 3) 12/28/2018: WBC 10.9, Hgb 16.3, MCV 67.8, Hgb 982 4) 10/25/2019: WBC 9.5, Hgb 15.8, MCV 67.9, Plt 976 5) 12/19/2019: establish care with Dr. Lorenso Courier. JAK2 V617F mutation returned as positive. Patient already on ASA 64m PO daily.  6) 01/16/2020: bone marrow biopsy confirms hypercellular marrow consistent with polycythemia vera. Start hydroxyurea 500 mg PO daily.   Interval History:  Connie VIALPANDO59y.o. female with medical history significant for JAK2 positive PV who presents for a follow up visit.  [facility]  She would like to come out to see [place] our office is a little bit but more quiet for her.  She feels that she can have a little bit more privacy.  She is getting phlebotomized.  She is on Hydrea.  Her platelet count is coming down nicely.  Today, her platelet count was 977,000.  Her hematocrit was 51.7%.  We did go ahead and phlebotomize her.  Her iron studies do show iron deficiency.  Her ferritin is 13 with an iron saturation of 5%.    I think what is really confirmatory for her polycythemia is that her erythropoietin has only 0.7.  She has a strong faith.  We talked about this.  I did give her a prayer blanket which she was very appreciative of.    She has had no problems with nausea or vomiting.  She has had no cough or shortness of breath.  There is been no change in bowel or bladder habits.  Overall, I would say performance status is ECOG 1.  MEDICAL HISTORY:  Past Medical History:  Diagnosis Date  . Anemia   . Arthritis   .  Complication of anesthesia    " I don't tolerate the caine's (Novacaine, Lidocaine etc. well " also, no codiene.   .Marland KitchenGERD (gastroesophageal reflux disease)   . Headache   . Hypertension   . Sleep apnea    PMH: " I don't have it now "  . Ventral hernia   . Wears glasses     SURGICAL HISTORY: Past Surgical History:  Procedure Laterality Date  . CESAREAN SECTION    . CHOLECYSTECTOMY    . COLONOSCOPY    . HERNIA REPAIR    . INSERTION OF MESH N/A 12/05/2017   Procedure: INSERTION OF MESH;  Surgeon: TDonnie Mesa MD;  [facility]  Service: General;  Laterality: N/A;  . VENTRAL HERNIA REPAIR  12/05/2017   OPEN VENTRAL HERNIA REPAIR ERAS PATHWAY w/mesh  . VENTRAL HERNIA REPAIR N/A 12/05/2017   Procedure: OPEN VENTRAL HERNIA REPAIR ERAS PATHWAY;  Surgeon: TDonnie Mesa MD;  [place] [facility]  Service: General;  Laterality: N/A;  . VENTRAL HERNIA REPAIR  01/01/2019  . VENTRAL HERNIA REPAIR N/A 01/01/2019   Procedure: LAPAROSCOPIC VENTRAL HERNIA REPAIR WITH MESH;  Surgeon: TDonnie Mesa MD;  [place] [facility]  Service: General;  Laterality: N/A;  . WISDOM TOOTH EXTRACTION    . WOUND EXPLORATION N/A 12/29/2017   Procedure: ABDOMINAL WOUND EXPLORATION;  Surgeon: TDonnie Mesa MD;  [place] [facility]  Service: General;  Laterality:  N/A;  LMA    SOCIAL HISTORY: Social History   Socioeconomic History  . Marital status: Married    Spouse name: Not on file  . Number of children: Not on file  . Years of education: Not on file  . Highest education level: Not on file  Occupational History  . Not on file  Tobacco Use  . Smoking status: Never Smoker  . Smokeless tobacco: Never Used  Vaping Use  . Vaping Use: Never used  Substance and Sexual Activity  . Alcohol use: Yes    Comment: occasionally  . Drug use: Not Currently  . Sexual activity: Not on file  Other Topics Concern  . Not on file  Social History Narrative  . Not on file   Social Determinants of Health   Financial  Resource Strain:   . Difficulty of Paying Living Expenses: Not on file  Food Insecurity:   . Worried About Charity fundraiser in the Last Year: Not on file  . Ran Out of Food in the Last Year: Not on file  Transportation Needs:   . Lack of Transportation (Medical): Not on file  . Lack of Transportation (Non-Medical): Not on file  Physical Activity:   . Days of Exercise per Week: Not on file  . Minutes of Exercise per Session: Not on file  Stress:   . Feeling of Stress : Not on file  Social Connections:   . Frequency of Communication with Friends and Family: Not on file  . Frequency of Social Gatherings with Friends and Family: Not on file  . Attends Religious Services: Not on file  . Active Member of Clubs or Organizations: Not on file  . Attends Archivist Meetings: Not on file  . Marital Status: Not on file  Intimate Partner Violence:   . Fear of Current or Ex-Partner: Not on file  . Emotionally Abused: Not on file  . Physically Abused: Not on file  . Sexually Abused: Not on file    FAMILY HISTORY: Family History  Problem Relation Age of Onset  . Breast cancer Mother     ALLERGIES:  is allergic to codeine and local [lidocaine].  MEDICATIONS:  Current Outpatient Medications  Medication Sig Dispense Refill  . aspirin EC 81 MG tablet Take 81 mg by mouth daily. Swallow whole.    . fexofenadine (ALLEGRA) 180 MG tablet Take 180 mg by mouth daily as needed for allergies.    . hydrochlorothiazide (HYDRODIURIL) 25 MG tablet Take 1 tablet (25 mg total) by mouth daily. 30 tablet 0  . hydroxyurea (HYDREA) 500 MG capsule Take 1 capsule (500 mg total) by mouth daily. May take with food to minimize GI side effects. 90 capsule 1  . ibuprofen (ADVIL) 200 MG tablet Take 600 mg by mouth every 6 (six) hours as needed.    . irbesartan (AVAPRO) 150 MG tablet Take 150 mg by mouth daily.     No current facility-administered medications for this visit.    REVIEW OF SYSTEMS:     Constitutional: ( - ) fevers, ( - )  chills , ( - ) night sweats Eyes: ( - ) blurriness of vision, ( - ) double vision, ( - ) watery eyes Ears, nose, mouth, throat, and face: ( - ) mucositis, ( - ) sore throat Respiratory: ( - ) cough, ( - ) dyspnea, ( - ) wheezes Cardiovascular: ( - ) palpitation, ( - ) chest discomfort, ( - ) lower extremity swelling Gastrointestinal:  ( - )  nausea, ( - ) heartburn, ( - ) change in bowel habits Skin: ( - ) abnormal skin rashes Lymphatics: ( - ) new lymphadenopathy, ( - ) easy bruising Neurological: ( - ) numbness, ( - ) tingling, ( - ) new weaknesses Behavioral/Psych: ( - ) mood change, ( - ) new changes  All other systems were reviewed with the patient and are negative.  PHYSICAL EXAMINATION: ECOG PERFORMANCE STATUS: 0 - Asymptomatic  Vitals:   02/17/20 1048  BP: (!) 153/88  Pulse: 73  Resp: 18  Temp: 98.2 F (36.8 C)  SpO2: 98%   Filed Weights   02/17/20 1048  Weight: 240 lb (108.9 kg)    GENERAL: well appearing middle aged Caucasian female. alert, no distress and comfortable SKIN: skin color, texture, turgor are normal, no rashes or significant lesions EYES: conjunctiva are pink and non-injected, sclera clear LUNGS: clear to auscultation and percussion with normal breathing effort HEART: regular rate & rhythm and no murmurs and no lower extremity edema Musculoskeletal: no cyanosis of digits and no clubbing  PSYCH: alert & oriented x 3, fluent speech NEURO: no focal motor/sensory deficits  LABORATORY DATA:  I have reviewed the data as listed CBC Latest Ref Rng & Units 02/17/2020 02/03/2020 01/29/2020  WBC 4.0 - 10.5 K/uL 10.1 10.7(H) 12.0(H)  Hemoglobin 12.0 - 15.0 g/dL 16.0(H) 17.1(H) 16.7(H)  Hematocrit 36 - 46 % 51.7(H) 55.2(H) 54.1(H)  Platelets 150 - 400 K/uL 977(HH) 1,048(HH) 1,020(HH)    CMP Latest Ref Rng & Units 02/17/2020 01/29/2020 12/19/2019  Glucose 70 - 99 mg/dL 105(H) 93 100(H)  BUN 6 - 20 mg/dL '13 12 10  ' Creatinine  0.44 - 1.00 mg/dL 0.63 0.73 0.72  Sodium 135 - 145 mmol/L 140 140 140  Potassium 3.5 - 5.1 mmol/L 4.0 4.1 4.2  Chloride 98 - 111 mmol/L 106 105 105  CO2 22 - 32 mmol/L '26 26 24  ' Calcium 8.9 - 10.3 mg/dL 9.3 9.5 9.1  Total Protein 6.5 - 8.1 g/dL 7.3 7.5 7.9  Total Bilirubin 0.3 - 1.2 mg/dL 0.8 0.8 0.6  Alkaline Phos 38 - 126 U/L 93 102 108  AST 15 - 41 U/L 12(L) 15 15  ALT 0 - 44 U/L '12 14 16    ' RADIOGRAPHIC STUDIES:  No results found.  ASSESSMENT & PLAN Connie Myers 59 y.o. female with medical history significant for JAK2 positive PV who presents for a follow up visit. After review of the labs, discussion with the patient and review of the prior records her findings are most consistent with polycythemia vera.  The findings show that the patient has iron deficiency, low EPO levels, and marked erythrocytosis and thrombocytosis.  It is most likely that the patient has polycythemia vera which is caused an overproduction of red blood cells resulting in decreased iron stores and subsequent development of thrombocytosis.  The treatments at this time are designed to decrease her risk of thrombosis.  As such we will continue her on 81 mg ASA daily and will add hydroxyurea 500 mg p.o. daily as cytoreductive therapy.  Additionally in order to drop her levels further we will have a target hematocrit of 42%.  For this we will be performing phlebotomy to drop her levels to target.  Today we also discussed the risk of developing acute leukemia as well as myelofibrosis.  We will start phlebotomies as soon as we can and the hydroxyurea prescription has already been prescribed.  We will plan to see her back in approximately  1 month's time in order to evaluate how she is tolerating therapy.  # Polycythemia Vera, JAK2 Positive # Thrombocytosis 2/2 to Iron Deficiency --continue ASA 57m PO daily for thromboprophylaxis --prescribe hydroxyurea 5078mPO daily for cytoreductive therapy --will begin  phlebotomies, target HCT 42% (currently 54.1%) Plan for q 2 week phlebotomies until our goal is reached. --avoid iron supplementation/iron rich foods as our goal is to decrease the bodies iron supply. --discussed risk for development of acute leukemia/myelofibrosis --RTC in 1 month after the start of phlebotomies.   Orders Placed This Encounter  Procedures  . JAK2 (including V617F and Exon 12), MPL, and CALR-Next Generation Sequencing    Standing Status:   Future    Standing Expiration Date:   02/16/2021  . Lactate dehydrogenase    Standing Status:   Future    Number of Occurrences:   1    Standing Expiration Date:   02/16/2021  . CBC with Differential (Cancer Center Only)    Standing Status:   Future    Number of Occurrences:   1    Standing Expiration Date:   02/16/2021  . Save Smear ([facility])    Standing Status:   Future    Number of Occurrences:   1    Standing Expiration Date:   02/16/2021  . CMP ([facility])    Standing Status:   Future    Number of Occurrences:   1    Standing Expiration Date:   02/16/2021  . Erythropoietin    Standing Status:   Future    Standing Expiration Date:   02/16/2021  . Vitamin B12    Standing Status:   Future    Standing Expiration Date:   02/16/2021  . Ferritin    Standing Status:   Future    Number of Occurrences:   1    Standing Expiration Date:   02/16/2021  . Iron and TIBC    Standing Status:   Future    Number of Occurrences:   1    Standing Expiration Date:   02/16/2021  . Von Willebrand panel    Standing Status:   Future    Number of Occurrences:   1    Standing Expiration Date:   02/16/2021    All questions were answered. The patient knows to call the clinic with any problems, questions or concerns.  A total of more than 40 minutes were spent on this encounter and over half of that time was spent on counseling and coordination of care as outlined above.   JoLedell PeoplesMD Department of Hematology/Oncology [facility]: [phone]: [phone]: [email or web address]  02/22/2020 11:25 AM

## 2020-02-25 ENCOUNTER — Other Ambulatory Visit: Payer: Self-pay | Admitting: Family

## 2020-02-25 ENCOUNTER — Telehealth: Payer: Self-pay | Admitting: Hematology & Oncology

## 2020-02-25 NOTE — Telephone Encounter (Signed)
Appointments scheduled and I LMVM for patient with date/time.  I asked her to call back if she needed to reschedule

## 2020-03-02 ENCOUNTER — Other Ambulatory Visit: Payer: No Typology Code available for payment source

## 2020-03-02 ENCOUNTER — Ambulatory Visit: Payer: No Typology Code available for payment source | Admitting: Hematology and Oncology

## 2020-03-04 ENCOUNTER — Other Ambulatory Visit: Payer: Self-pay

## 2020-03-04 ENCOUNTER — Ambulatory Visit: Payer: No Typology Code available for payment source | Admitting: Hematology and Oncology

## 2020-03-04 ENCOUNTER — Other Ambulatory Visit: Payer: No Typology Code available for payment source

## 2020-03-04 ENCOUNTER — Inpatient Hospital Stay: Payer: No Typology Code available for payment source | Attending: Hematology and Oncology

## 2020-03-04 DIAGNOSIS — Z803 Family history of malignant neoplasm of breast: Secondary | ICD-10-CM | POA: Insufficient documentation

## 2020-03-04 DIAGNOSIS — D473 Essential (hemorrhagic) thrombocythemia: Secondary | ICD-10-CM | POA: Insufficient documentation

## 2020-03-04 DIAGNOSIS — Z885 Allergy status to narcotic agent status: Secondary | ICD-10-CM | POA: Insufficient documentation

## 2020-03-04 DIAGNOSIS — Z7982 Long term (current) use of aspirin: Secondary | ICD-10-CM | POA: Diagnosis not present

## 2020-03-04 DIAGNOSIS — D751 Secondary polycythemia: Secondary | ICD-10-CM | POA: Diagnosis not present

## 2020-03-04 NOTE — Patient Instructions (Signed)

## 2020-03-04 NOTE — Progress Notes (Signed)
Connie Myers presents today for phlebotomy per MD orders. Phlebotomy procedure started at 0823 and ended at 0833. 597 grams removed from lt AC using 16g phlebotomy kit.  Patient observed for 30 minutes after procedure without any incident. Patient tolerated procedure well. IV needle removed intact.

## 2020-03-16 ENCOUNTER — Inpatient Hospital Stay: Payer: No Typology Code available for payment source

## 2020-03-16 ENCOUNTER — Inpatient Hospital Stay (HOSPITAL_BASED_OUTPATIENT_CLINIC_OR_DEPARTMENT_OTHER): Payer: No Typology Code available for payment source | Admitting: Hematology & Oncology

## 2020-03-16 ENCOUNTER — Encounter: Payer: Self-pay | Admitting: Hematology & Oncology

## 2020-03-16 ENCOUNTER — Other Ambulatory Visit: Payer: Self-pay

## 2020-03-16 VITALS — BP 128/72 | HR 68 | Resp 17

## 2020-03-16 VITALS — BP 140/78 | HR 75 | Temp 98.0°F | Resp 18 | Wt 235.5 lb

## 2020-03-16 DIAGNOSIS — D45 Polycythemia vera: Secondary | ICD-10-CM | POA: Diagnosis not present

## 2020-03-16 DIAGNOSIS — D473 Essential (hemorrhagic) thrombocythemia: Secondary | ICD-10-CM | POA: Diagnosis not present

## 2020-03-16 LAB — CMP (CANCER CENTER ONLY)
ALT: 10 U/L (ref 0–44)
AST: 12 U/L — ABNORMAL LOW (ref 15–41)
Albumin: 4.4 g/dL (ref 3.5–5.0)
Alkaline Phosphatase: 87 U/L (ref 38–126)
Anion gap: 8 (ref 5–15)
BUN: 14 mg/dL (ref 8–23)
CO2: 27 mmol/L (ref 22–32)
Calcium: 9.5 mg/dL (ref 8.9–10.3)
Chloride: 104 mmol/L (ref 98–111)
Creatinine: 0.68 mg/dL (ref 0.44–1.00)
GFR, Est AFR Am: >60 mL/min (ref >60)
GFR, Estimated: >60 mL/min (ref >60)
Glucose, Bld: 101 mg/dL — ABNORMAL HIGH (ref 70–99)
Potassium: 3.9 mmol/L (ref 3.5–5.1)
Sodium: 139 mmol/L (ref 135–145)
Total Bilirubin: 0.7 mg/dL (ref 0.3–1.2)
Total Protein: 7.5 g/dL (ref 6.5–8.1)

## 2020-03-16 LAB — CBC WITH DIFFERENTIAL (CANCER CENTER ONLY)
Abs Immature Granulocytes: 0.05 10*3/uL (ref 0.00–0.07)
Basophils Absolute: 0.2 10*3/uL — ABNORMAL HIGH (ref 0.0–0.1)
Basophils Relative: 2 %
Eosinophils Absolute: 0.7 10*3/uL — ABNORMAL HIGH (ref 0.0–0.5)
Eosinophils Relative: 7 %
HCT: 47.4 % — ABNORMAL HIGH (ref 36.0–46.0)
Hemoglobin: 14.9 g/dL (ref 12.0–15.0)
Immature Granulocytes: 1 %
Lymphocytes Relative: 19 %
Lymphs Abs: 1.9 10*3/uL (ref 0.7–4.0)
MCH: 21.8 pg — ABNORMAL LOW (ref 26.0–34.0)
MCHC: 31.4 g/dL (ref 30.0–36.0)
MCV: 69.4 fL — ABNORMAL LOW (ref 80.0–100.0)
Monocytes Absolute: 0.9 10*3/uL (ref 0.1–1.0)
Monocytes Relative: 9 %
Neutro Abs: 6.1 10*3/uL (ref 1.7–7.7)
Neutrophils Relative %: 62 %
Platelet Count: 616 10*3/uL — ABNORMAL HIGH (ref 150–400)
RBC: 6.83 MIL/uL — ABNORMAL HIGH (ref 3.87–5.11)
RDW: 19.8 % — ABNORMAL HIGH (ref 11.5–15.5)
WBC Count: 9.9 10*3/uL (ref 4.0–10.5)
nRBC: 0 % (ref 0.0–0.2)

## 2020-03-16 LAB — VITAMIN B12: Vitamin B-12: 572 pg/mL (ref 180–914)

## 2020-03-16 LAB — PLATELET BY CITRATE

## 2020-03-16 LAB — SAVE SMEAR(SSMR), FOR PROVIDER SLIDE REVIEW

## 2020-03-16 LAB — LACTATE DEHYDROGENASE: LDH: 239 U/L — ABNORMAL HIGH (ref 98–192)

## 2020-03-16 NOTE — Progress Notes (Signed)
Connie Myers presents today for phlebotomy per MD orders. Phlebotomy procedure started at 1340 and ended at 1355. 462 grams removed via 16 gauge needle from phlebotomy kit to left AC. Pt c/o pain to needle site so phlebotomy ended early for patient comfort. Patient observed for 30 minutes after procedure without any incident. Patient tolerated procedure well. IV needle removed intact.

## 2020-03-16 NOTE — Progress Notes (Signed)
Connie Myers presents today for phlebotomy per MD orders. Phlebotomy procedure started at            and ended at        grams removed. Patient observed for 30 minutes after procedure without any incident. Patient tolerated procedure well. IV needle removed intact.

## 2020-03-16 NOTE — Progress Notes (Signed)
[place] Telephone:([phone]   Fax:([phone]  PROGRESS NOTE  Patient Care Team: Kristen Loader, FNP as PCP - General (Family Medicine)  Hematological/Oncological History # Polycythemia Vera, JAK2 Positive # Thrombocytosis 2/2 to Iron Deficiency 1) 08/14/2017: WBC 8.0, Hgb 16.2, Plt 713, MCV 71.9. First CBC on record 2) 12/29/2017: WBC 14.1, Hgb 15.0, MCV 72.1, Plt 1074 3) 12/28/2018: WBC 10.9, Hgb 16.3, MCV 67.8, Hgb 982 4) 10/25/2019: WBC 9.5, Hgb 15.8, MCV 67.9, Plt 976 5) 12/19/2019: establish care with Dr. Lorenso Courier. JAK2 V617F mutation returned as positive. Patient already on ASA 16m PO daily.  6) 01/16/2020: bone marrow biopsy confirms hypercellular marrow consistent with polycythemia vera. Start hydroxyurea 500 mg PO daily.   Interval History:  GRAMIA SIDNEYback for follow-up.  She is doing pretty well.  We last saw her back in August.  She is on aspirin.  We do phlebotomies on her.  She is doing hydroxyurea.  She feels okay.  She does have headaches and these have improved.  I suspect these probably are better because her hemoglobin is down and she has better blood flow through her brain.  She has had no problem with cough or shortness of breath.  She has had no nausea or vomiting.  She has had no blurred vision.  She had no pain in her hands or feet.  When we last saw her, her ferritin was 13 with an iron saturation of 5%.    Overall, I would say performance status is ECOG 1.  MEDICAL HISTORY:  Past Medical History:  Diagnosis Date  . Anemia   . Arthritis   . Complication of anesthesia    " I don't tolerate the caine's (Novacaine, Lidocaine etc. well " also, no codiene.   .Marland KitchenGERD (gastroesophageal reflux disease)   . Headache   . Hypertension   . Sleep apnea    PMH: " I don't have it now "  . Ventral hernia   . Wears glasses     SURGICAL HISTORY: Past Surgical History:  Procedure Laterality Date  . CESAREAN SECTION    .  CHOLECYSTECTOMY    . COLONOSCOPY    . HERNIA REPAIR    . INSERTION OF MESH N/A 12/05/2017   Procedure: INSERTION OF MESH;  Surgeon: TDonnie Mesa MD;  [place] [facility]  Service: General;  Laterality: N/A;  . VENTRAL HERNIA REPAIR  12/05/2017   OPEN VENTRAL HERNIA REPAIR ERAS PATHWAY w/mesh  . VENTRAL HERNIA REPAIR N/A 12/05/2017   Procedure: OPEN VENTRAL HERNIA REPAIR ERAS PATHWAY;  Surgeon: TDonnie Mesa MD;  [facility]  Service: General;  Laterality: N/A;  . VENTRAL HERNIA REPAIR  01/01/2019  . VENTRAL HERNIA REPAIR N/A 01/01/2019   Procedure: LAPAROSCOPIC VENTRAL HERNIA REPAIR WITH MESH;  Surgeon: TDonnie Mesa MD;  [place] [facility]  Service: General;  Laterality: N/A;  . WISDOM TOOTH EXTRACTION    . WOUND EXPLORATION N/A 12/29/2017   Procedure: ABDOMINAL WOUND EXPLORATION;  Surgeon: TDonnie Mesa MD;  [place] [facility]  Service: General;  Laterality: N/A;  LMA    SOCIAL HISTORY: Social History   Socioeconomic History  . Marital status: Married    Spouse name: Not on file  . Number of children: Not on file  . Years of education: Not on file  . Highest education level: Not on file  Occupational History  . Not on file  Tobacco Use  . Smoking status: Never Smoker  . Smokeless tobacco: Never Used  Vaping Use  . Vaping Use: Never used  Substance and Sexual Activity  . Alcohol use: Yes    Comment: occasionally  . Drug use: Not Currently  . Sexual activity: Not on file  Other Topics Concern  . Not on file  Social History Narrative  . Not on file   Social Determinants of Health   Financial Resource Strain:   . Difficulty of Paying Living Expenses: Not on file  Food Insecurity:   . Worried About Charity fundraiser in the Last Year: Not on file  . Ran Out of Food in the Last Year: Not on file  Transportation Needs:   . Lack of Transportation (Medical): Not on file  . Lack of Transportation (Non-Medical): Not on file  Physical Activity:   . Days of Exercise per  Week: Not on file  . Minutes of Exercise per Session: Not on file  Stress:   . Feeling of Stress : Not on file  Social Connections:   . Frequency of Communication with Friends and Family: Not on file  . Frequency of Social Gatherings with Friends and Family: Not on file  . Attends Religious Services: Not on file  . Active Member of Clubs or Organizations: Not on file  . Attends Archivist Meetings: Not on file  . Marital Status: Not on file  Intimate Partner Violence:   . Fear of Current or Ex-Partner: Not on file  . Emotionally Abused: Not on file  . Physically Abused: Not on file  . Sexually Abused: Not on file    FAMILY HISTORY: Family History  Problem Relation Age of Onset  . Breast cancer Mother     ALLERGIES:  is allergic to codeine and local [lidocaine].  MEDICATIONS:  Current Outpatient Medications  Medication Sig Dispense Refill  . aspirin EC 81 MG tablet Take 81 mg by mouth daily. Swallow whole.    . fexofenadine (ALLEGRA) 180 MG tablet Take 180 mg by mouth daily as needed for allergies.    . hydrochlorothiazide (HYDRODIURIL) 25 MG tablet Take 1 tablet (25 mg total) by mouth daily. 30 tablet 0  . hydroxyurea (HYDREA) 500 MG capsule Take 1 capsule (500 mg total) by mouth daily. May take with food to minimize GI side effects. 90 capsule 1  . ibuprofen (ADVIL) 200 MG tablet Take 600 mg by mouth every 6 (six) hours as needed.    . irbesartan (AVAPRO) 150 MG tablet Take 150 mg by mouth daily.     No current facility-administered medications for this visit.    REVIEW OF SYSTEMS:   Constitutional: ( - ) fevers, ( - )  chills , ( - ) night sweats Eyes: ( - ) blurriness of vision, ( - ) double vision, ( - ) watery eyes Ears, nose, mouth, throat, and face: ( - ) mucositis, ( - ) sore throat Respiratory: ( - ) cough, ( - ) dyspnea, ( - ) wheezes Cardiovascular: ( - ) palpitation, ( - ) chest discomfort, ( - ) lower extremity swelling Gastrointestinal:  ( - )  nausea, ( - ) heartburn, ( - ) change in bowel habits Skin: ( - ) abnormal skin rashes Lymphatics: ( - ) new lymphadenopathy, ( - ) easy bruising Neurological: ( - ) numbness, ( - ) tingling, ( - ) new weaknesses Behavioral/Psych: ( - ) mood change, ( - ) new changes  All other systems were reviewed with the patient and are negative.  PHYSICAL EXAMINATION: ECOG PERFORMANCE  STATUS: 0 - Asymptomatic  Vitals:   03/16/20 1231  BP: 140/78  Pulse: 75  Resp: 18  Temp: 98 F (36.7 C)  SpO2: 95%   Filed Weights   03/16/20 1231  Weight: 235 lb 8 oz (106.8 kg)    GENERAL: well appearing middle aged Caucasian female. alert, no distress and comfortable SKIN: skin color, texture, turgor are normal, no rashes or significant lesions EYES: conjunctiva are pink and non-injected, sclera clear LUNGS: clear to auscultation and percussion with normal breathing effort HEART: regular rate & rhythm and no murmurs and no lower extremity edema Musculoskeletal: no cyanosis of digits and no clubbing  PSYCH: alert & oriented x 3, fluent speech NEURO: no focal motor/sensory deficits  LABORATORY DATA:  I have reviewed the data as listed CBC Latest Ref Rng & Units 03/16/2020 02/17/2020 02/03/2020  WBC 4.0 - 10.5 K/uL 9.9 10.1 10.7(H)  Hemoglobin 12.0 - 15.0 g/dL 14.9 16.0(H) 17.1(H)  Hematocrit 36 - 46 % 47.4(H) 51.7(H) 55.2(H)  Platelets 150 - 400 K/uL 616(H) 977(HH) 1,048(HH)    CMP Latest Ref Rng & Units 03/16/2020 02/17/2020 01/29/2020  Glucose 70 - 99 mg/dL 101(H) 105(H) 93  BUN 8 - 23 mg/dL '14 13 12  ' Creatinine 0.44 - 1.00 mg/dL 0.68 0.63 0.73  Sodium 135 - 145 mmol/L 139 140 140  Potassium 3.5 - 5.1 mmol/L 3.9 4.0 4.1  Chloride 98 - 111 mmol/L 104 106 105  CO2 22 - 32 mmol/L '27 26 26  ' Calcium 8.9 - 10.3 mg/dL 9.5 9.3 9.5  Total Protein 6.5 - 8.1 g/dL 7.5 7.3 7.5  Total Bilirubin 0.3 - 1.2 mg/dL 0.7 0.8 0.8  Alkaline Phos 38 - 126 U/L 87 93 102  AST 15 - 41 U/L 12(L) 12(L) 15  ALT 0 - 44 U/L '10  12 14    ' RADIOGRAPHIC STUDIES:  No results found.  ASSESSMENT & PLAN SHERIECE JEFCOAT 59 y.o. female with medical history significant for JAK2 positive PV who presents for a follow up visit. After review of the labs, discussion with the patient and review of the prior records her findings are most consistent with polycythemia vera.  The findings show that the patient has iron deficiency, low EPO levels, and marked erythrocytosis and thrombocytosis.  It is most likely that the patient has polycythemia vera which is caused an overproduction of red blood cells resulting in decreased iron stores and subsequent development of thrombocytosis.  The treatments at this time are designed to decrease her risk of thrombosis.  As such we will continue her on 81 mg ASA daily and will add hydroxyurea 500 mg p.o. daily as cytoreductive therapy.  Additionally in order to drop her levels further we will have a target hematocrit of 42%.  For this we will be performing phlebotomy to drop her levels to target.  Today we also discussed the risk of developing acute leukemia as well as myelofibrosis.  We will start phlebotomies as soon as we can and the hydroxyurea prescription has already been prescribed.  We will plan to see her back in approximately 1 month's time in order to evaluate how she is tolerating therapy.  I am glad that everything is going quite well.  Again, we will phlebotomize her today.  We want to keep her hematocrit below 45%.  She is so far, she is doing a good job with this.  The Hydrea is really helping.  This is getting her platelet count down quite a bit.  I suspect  that her lower platelets also are helping with her headaches.  We will plan to get her back in about 6 weeks.  I want to try to schedule her visit so that we can try to get her through all the holidays.  Volanda Napoleon, MD   03/16/2020 1:11 PM

## 2020-03-16 NOTE — Patient Instructions (Signed)

## 2020-03-17 LAB — ERYTHROPOIETIN: Erythropoietin: 1 m[IU]/mL — ABNORMAL LOW (ref 2.6–18.5)

## 2020-03-18 ENCOUNTER — Encounter: Payer: Self-pay | Admitting: Hematology & Oncology

## 2020-03-24 LAB — JAK2 (INCLUDING V617F AND EXON 12), MPL,& CALR-NEXT GEN SEQ

## 2020-03-25 ENCOUNTER — Encounter: Payer: Self-pay | Admitting: Hematology & Oncology

## 2020-04-13 ENCOUNTER — Encounter: Payer: Self-pay | Admitting: Hematology & Oncology

## 2020-04-14 ENCOUNTER — Encounter: Payer: Self-pay | Admitting: Hematology & Oncology

## 2020-04-24 ENCOUNTER — Encounter: Payer: Self-pay | Admitting: Hematology & Oncology

## 2020-04-27 ENCOUNTER — Encounter: Payer: Self-pay | Admitting: Hematology & Oncology

## 2020-04-27 ENCOUNTER — Ambulatory Visit: Payer: No Typology Code available for payment source | Admitting: Hematology & Oncology

## 2020-04-27 ENCOUNTER — Other Ambulatory Visit: Payer: No Typology Code available for payment source

## 2020-05-12 ENCOUNTER — Encounter: Payer: Self-pay | Admitting: Hematology & Oncology

## 2020-05-18 ENCOUNTER — Other Ambulatory Visit: Payer: No Typology Code available for payment source

## 2020-05-18 ENCOUNTER — Ambulatory Visit: Payer: No Typology Code available for payment source | Admitting: Hematology & Oncology

## 2020-05-28 ENCOUNTER — Telehealth: Payer: Self-pay

## 2020-05-28 ENCOUNTER — Inpatient Hospital Stay: Payer: No Typology Code available for payment source | Attending: Hematology and Oncology

## 2020-05-28 ENCOUNTER — Inpatient Hospital Stay (HOSPITAL_BASED_OUTPATIENT_CLINIC_OR_DEPARTMENT_OTHER): Payer: No Typology Code available for payment source | Admitting: Hematology & Oncology

## 2020-05-28 ENCOUNTER — Inpatient Hospital Stay: Payer: No Typology Code available for payment source

## 2020-05-28 ENCOUNTER — Other Ambulatory Visit: Payer: Self-pay

## 2020-05-28 ENCOUNTER — Encounter: Payer: Self-pay | Admitting: Hematology & Oncology

## 2020-05-28 VITALS — BP 153/78 | HR 54 | Temp 98.4°F | Resp 18 | Wt 239.0 lb

## 2020-05-28 VITALS — BP 146/58 | HR 66 | Resp 19

## 2020-05-28 DIAGNOSIS — D751 Secondary polycythemia: Secondary | ICD-10-CM | POA: Diagnosis not present

## 2020-05-28 DIAGNOSIS — Z803 Family history of malignant neoplasm of breast: Secondary | ICD-10-CM | POA: Insufficient documentation

## 2020-05-28 DIAGNOSIS — Z7982 Long term (current) use of aspirin: Secondary | ICD-10-CM | POA: Insufficient documentation

## 2020-05-28 DIAGNOSIS — D473 Essential (hemorrhagic) thrombocythemia: Secondary | ICD-10-CM | POA: Diagnosis present

## 2020-05-28 DIAGNOSIS — D45 Polycythemia vera: Secondary | ICD-10-CM

## 2020-05-28 DIAGNOSIS — Z885 Allergy status to narcotic agent status: Secondary | ICD-10-CM | POA: Diagnosis not present

## 2020-05-28 LAB — CMP (CANCER CENTER ONLY)
ALT: 11 U/L (ref 0–44)
AST: 12 U/L — ABNORMAL LOW (ref 15–41)
Albumin: 4.3 g/dL (ref 3.5–5.0)
Alkaline Phosphatase: 87 U/L (ref 38–126)
Anion gap: 8 (ref 5–15)
BUN: 19 mg/dL (ref 8–23)
CO2: 29 mmol/L (ref 22–32)
Calcium: 9.8 mg/dL (ref 8.9–10.3)
Chloride: 102 mmol/L (ref 98–111)
Creatinine: 0.83 mg/dL (ref 0.44–1.00)
GFR, Estimated: >60 mL/min (ref >60)
Glucose, Bld: 92 mg/dL (ref 70–99)
Potassium: 4.2 mmol/L (ref 3.5–5.1)
Sodium: 139 mmol/L (ref 135–145)
Total Bilirubin: 0.7 mg/dL (ref 0.3–1.2)
Total Protein: 7.5 g/dL (ref 6.5–8.1)

## 2020-05-28 LAB — CBC WITH DIFFERENTIAL (CANCER CENTER ONLY)
Abs Immature Granulocytes: 0.06 10*3/uL (ref 0.00–0.07)
Basophils Absolute: 0.2 10*3/uL — ABNORMAL HIGH (ref 0.0–0.1)
Basophils Relative: 2 %
Eosinophils Absolute: 0.6 10*3/uL — ABNORMAL HIGH (ref 0.0–0.5)
Eosinophils Relative: 6 %
HCT: 46.6 % — ABNORMAL HIGH (ref 36.0–46.0)
Hemoglobin: 14.3 g/dL (ref 12.0–15.0)
Immature Granulocytes: 1 %
Lymphocytes Relative: 22 %
Lymphs Abs: 2.2 10*3/uL (ref 0.7–4.0)
MCH: 21.5 pg — ABNORMAL LOW (ref 26.0–34.0)
MCHC: 30.7 g/dL (ref 30.0–36.0)
MCV: 70 fL — ABNORMAL LOW (ref 80.0–100.0)
Monocytes Absolute: 1.1 10*3/uL — ABNORMAL HIGH (ref 0.1–1.0)
Monocytes Relative: 11 %
Neutro Abs: 5.9 10*3/uL (ref 1.7–7.7)
Neutrophils Relative %: 58 %
Platelet Count: 465 10*3/uL — ABNORMAL HIGH (ref 150–400)
RBC: 6.66 MIL/uL — ABNORMAL HIGH (ref 3.87–5.11)
RDW: 17 % — ABNORMAL HIGH (ref 11.5–15.5)
WBC Count: 10 10*3/uL (ref 4.0–10.5)
nRBC: 0 % (ref 0.0–0.2)

## 2020-05-28 LAB — SAVE SMEAR(SSMR), FOR PROVIDER SLIDE REVIEW

## 2020-05-28 LAB — LACTATE DEHYDROGENASE: LDH: 170 U/L (ref 98–192)

## 2020-05-28 NOTE — Progress Notes (Signed)
Connie Myers presents today for phlebotomy per MD orders. Phlebotomy procedure started at 1533  and ended at 1539 517 grams removed. Patient observed for 30 minutes after procedure without any incident. Patient tolerated procedure well. IV needle removed intact.

## 2020-05-28 NOTE — Progress Notes (Signed)
Pt discharged in no apparent distress. Pt left ambulatory without assistance. Pt aware of discharge instructions and verbalized understanding and had no further questions.  

## 2020-05-28 NOTE — Patient Instructions (Signed)
Pt discharged in no apparent distress. Pt left ambulatory without assistance. Pt aware of discharge instructions and verbalized understanding and had no further questions.  

## 2020-05-28 NOTE — Progress Notes (Signed)
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 First CBC on record 2) 12/29/2017: WBC 14.1, Hgb 15.0, MCV 72.1, Plt 1074 3) 12/28/2018: WBC 10.9, Hgb 16.3, MCV 67.8, Hgb 982 4) 10/25/2019: WBC 9.5, Hgb 15.8, MCV 67.9, Plt 976 5) 12/19/2019: establish care with Dr. Lorenso Courier. JAK2 V617F mutation returned as positive. Patient already on ASA 44m PO daily.  6) 01/16/2020: bone marrow biopsy confirms hypercellular marrow consistent with polycythemia vera.   Current Therapy: Hydroxyurea 500 mg PO daily.   Interval History:  Connie SPELLSback for follow-up.  She is doing pretty well.  She has done very well with the Hydrea.  [facility]  Her platelet count is coming down well.  Her hemoglobin is slowly trending downward.  She is got a new family doctor.  He is helping to manage her blood pressure.  I really think that having her phlebotomize is helping with her blood pressure control.  She has had no problems with nausea or vomiting.  She has had no change in bowel or bladder habits.  She has had no cough.  She has had no leg swelling.      Overall, I would say performance status is ECOG 1.  MEDICAL HISTORY:  Past Medical History:  Diagnosis Date  . Anemia   . Arthritis   . Complication of anesthesia    " I don't tolerate the caine's (Novacaine, Lidocaine etc. well " also, no codiene.   .Marland KitchenGERD (gastroesophageal reflux disease)   . Headache   . Hypertension   . Sleep apnea    PMH: " I don't have it now "  . Ventral hernia   . Wears glasses     SURGICAL HISTORY: Past Surgical History:  Procedure Laterality Date  . CESAREAN SECTION    . CHOLECYSTECTOMY    . COLONOSCOPY     . HERNIA REPAIR    . INSERTION OF MESH N/A 12/05/2017   Procedure: INSERTION OF MESH;  Surgeon: TDonnie Mesa MD;  [place]  Service: General;  Laterality: N/A;  . VENTRAL HERNIA REPAIR  12/05/2017   OPEN VENTRAL HERNIA REPAIR ERAS PATHWAY w/mesh  . VENTRAL HERNIA REPAIR N/A 12/05/2017   Procedure: OPEN VENTRAL HERNIA REPAIR ERAS PATHWAY;  Surgeon: TDonnie Mesa MD;  [place] [facility]  Service: General;  Laterality: N/A;  . VENTRAL HERNIA REPAIR  01/01/2019  . VENTRAL HERNIA REPAIR N/A 01/01/2019   Procedure: LAPAROSCOPIC VENTRAL HERNIA REPAIR WITH MESH;  Surgeon: TDonnie Mesa MD;  [place]  Service: General;  Laterality: N/A;  . WISDOM TOOTH EXTRACTION    . WOUND EXPLORATION N/A 12/29/2017   Procedure: ABDOMINAL WOUND EXPLORATION;  Surgeon: TDonnie Mesa MD;  [place]  Service: General;  Laterality: N/A;  LMA    SOCIAL HISTORY: Social History   Socioeconomic History  . Marital status: Married    Spouse name: Not on file  . Number of children: Not on file  . Years of education: Not on file  . Highest education level: Not on file  Occupational History  . Not on file  Tobacco Use  . Smoking status: Never Smoker  . Smokeless tobacco: Never Used  Vaping Use  . Vaping Use: Never  used  Substance and Sexual Activity  . Alcohol use: Yes    Comment: occasionally  . Drug use: Not Currently  . Sexual activity: Not on file  Other Topics Concern  . Not on file  Social History Narrative  . Not on file   Social Determinants of Health   Financial Resource Strain: Not on file  Food Insecurity: Not on file  Transportation Needs: Not on file  Physical Activity: Not on file  Stress: Not on file  Social Connections: Not on file  Intimate Partner Violence: Not on file    FAMILY HISTORY: Family History  Problem Relation Age of Onset  . Breast cancer Mother     ALLERGIES:  is allergic to codeine and local [lidocaine].  MEDICATIONS:  Current  Outpatient Medications  Medication Sig Dispense Refill  . amLODIPine-Valsartan-HCTZ 10-160-25 MG TABS 1 tablet    . aspirin EC 81 MG tablet Take 81 mg by mouth daily. Swallow whole.    . fexofenadine (ALLEGRA) 180 MG tablet Take 180 mg by mouth daily as needed for allergies.    . hydrochlorothiazide (HYDRODIURIL) 25 MG tablet Take 1 tablet (25 mg total) by mouth daily. 30 tablet 0  . hydroxyurea (HYDREA) 500 MG capsule Take 1 capsule (500 mg total) by mouth daily. May take with food to minimize GI side effects. 90 capsule 1  . ibuprofen (ADVIL) 200 MG tablet Take 600 mg by mouth every 6 (six) hours as needed.    . irbesartan (AVAPRO) 150 MG tablet Take 150 mg by mouth daily.     No current facility-administered medications for this visit.    REVIEW OF SYSTEMS:   Constitutional: ( - ) fevers, ( - )  chills , ( - ) night sweats Eyes: ( - ) blurriness of vision, ( - ) double vision, ( - ) watery eyes Ears, nose, mouth, throat, and face: ( - ) mucositis, ( - ) sore throat Respiratory: ( - ) cough, ( - ) dyspnea, ( - ) wheezes Cardiovascular: ( - ) palpitation, ( - ) chest discomfort, ( - ) lower extremity swelling Gastrointestinal:  ( - ) nausea, ( - ) heartburn, ( - ) change in bowel habits Skin: ( - ) abnormal skin rashes Lymphatics: ( - ) new lymphadenopathy, ( - ) easy bruising Neurological: ( - ) numbness, ( - ) tingling, ( - ) new weaknesses Behavioral/Psych: ( - ) mood change, ( - ) new changes  All other systems were reviewed with the patient and are negative.  PHYSICAL EXAMINATION: ECOG PERFORMANCE STATUS: 0 - Asymptomatic  Vitals:   05/28/20 1426  BP: (!) 153/78  Pulse: (!) 54  Resp: 18  Temp: 98.4 F (36.9 C)  SpO2: 98%   Filed Weights   05/28/20 1426  Weight: 239 lb (108.4 kg)    GENERAL: well appearing middle aged Caucasian female. alert, no distress and comfortable SKIN: skin color, texture, turgor are normal, no rashes or significant lesions EYES: conjunctiva  are pink and non-injected, sclera clear LUNGS: clear to auscultation and percussion with normal breathing effort HEART: regular rate & rhythm and no murmurs and no lower extremity edema Musculoskeletal: no cyanosis of digits and no clubbing  PSYCH: alert & oriented x 3, fluent speech NEURO: no focal motor/sensory deficits  LABORATORY DATA:  I have reviewed the data as listed CBC Latest Ref Rng & Units 05/28/2020 03/16/2020 02/17/2020  WBC 4.0 - 10.5 K/uL 10.0 9.9 10.1  Hemoglobin 12.0 - 15.0 g/dL 14.3  14.9 16.0(H)  Hematocrit 36.0 - 46.0 % 46.6(H) 47.4(H) 51.7(H)  Platelets 150 - 400 K/uL 465(H) 616(H) 977(HH)    CMP Latest Ref Rng & Units 05/28/2020 03/16/2020 02/17/2020  Glucose 70 - 99 mg/dL 92 101(H) 105(H)  BUN 8 - 23 mg/dL '19 14 13  ' Creatinine 0.44 - 1.00 mg/dL 0.83 0.68 0.63  Sodium 135 - 145 mmol/L 139 139 140  Potassium 3.5 - 5.1 mmol/L 4.2 3.9 4.0  Chloride 98 - 111 mmol/L 102 104 106  CO2 22 - 32 mmol/L '29 27 26  ' Calcium 8.9 - 10.3 mg/dL 9.8 9.5 9.3  Total Protein 6.5 - 8.1 g/dL 7.5 7.5 7.3  Total Bilirubin 0.3 - 1.2 mg/dL 0.7 0.7 0.8  Alkaline Phos 38 - 126 U/L 87 87 93  AST 15 - 41 U/L 12(L) 12(L) 12(L)  ALT 0 - 44 U/L '11 10 12    ' RADIOGRAPHIC STUDIES:  No results found.  ASSESSMENT & PLAN Connie Myers 59 y.o. female with medical history significant for JAK2 positive PV who presents for a follow up visit. After review of the labs, discussion with the patient and review of the prior records her findings are most consistent with polycythemia vera.  The findings show that the patient has iron deficiency, low EPO levels, and marked erythrocytosis and thrombocytosis.  It is most likely that the patient has polycythemia vera which is caused an overproduction of red blood cells resulting in decreased iron stores and subsequent development of thrombocytosis.  The treatments at this time are designed to decrease her risk of thrombosis.  As such we will continue her on 81  mg ASA daily and will add hydroxyurea 500 mg p.o. daily as cytoreductive therapy.  Additionally in order to drop her levels further we will have a target hematocrit of 42%.  For this we will be performing phlebotomy to drop her levels to target.  Today we also discussed the risk of developing acute leukemia as well as myelofibrosis.  We will start phlebotomies as soon as we can and the hydroxyurea prescription has already been prescribed.  We will plan to see her back in approximately 1 month's time in order to evaluate how she is tolerating therapy.  I am glad that everything is going quite well.  Again, we will phlebotomize her today.  We want to keep her hematocrit below 45%.  She is so far, she is doing a good job with this.  The Hydrea is really helping.  This is getting her platelet count down quite a bit.  I suspect that her lower platelets also are helping with her headaches.  We will plan to get her back in about 8 weeks.  Volanda Napoleon, MD   05/28/2020 2:43 PM

## 2020-05-28 NOTE — Telephone Encounter (Signed)
appts made and printed for pt per 12/9/201 los,,, AOM

## 2020-05-29 LAB — FERRITIN: Ferritin: 8 ng/mL — ABNORMAL LOW (ref 11–307)

## 2020-05-29 LAB — IRON AND TIBC
Iron: 21 ug/dL — ABNORMAL LOW (ref 41–142)
Saturation Ratios: 5 % — ABNORMAL LOW (ref 21–57)
TIBC: 415 ug/dL (ref 236–444)
UIBC: 394 ug/dL — ABNORMAL HIGH (ref 120–384)

## 2020-06-16 ENCOUNTER — Encounter: Payer: Self-pay | Admitting: Hematology & Oncology

## 2020-06-16 ENCOUNTER — Telehealth: Payer: Self-pay

## 2020-06-16 NOTE — Telephone Encounter (Signed)
Per staff/inbasket message pt req to r/s 07/13/20 appt...done and left a vm with new appt...aom

## 2020-07-07 ENCOUNTER — Encounter: Payer: Self-pay | Admitting: Hematology & Oncology

## 2020-07-13 ENCOUNTER — Other Ambulatory Visit: Payer: No Typology Code available for payment source

## 2020-07-13 ENCOUNTER — Ambulatory Visit: Payer: No Typology Code available for payment source | Admitting: Hematology & Oncology

## 2020-07-15 ENCOUNTER — Other Ambulatory Visit: Payer: No Typology Code available for payment source

## 2020-07-15 ENCOUNTER — Ambulatory Visit: Payer: No Typology Code available for payment source | Admitting: Hematology & Oncology

## 2020-07-23 ENCOUNTER — Telehealth: Payer: Self-pay

## 2020-07-23 NOTE — Telephone Encounter (Signed)
Returned pts call to r/s her appt, aware that she will see sarah and not dr pe    Connie Myers

## 2020-07-24 ENCOUNTER — Encounter: Payer: Self-pay | Admitting: Hematology & Oncology

## 2020-08-05 ENCOUNTER — Ambulatory Visit: Payer: No Typology Code available for payment source | Admitting: Hematology & Oncology

## 2020-08-05 ENCOUNTER — Other Ambulatory Visit: Payer: No Typology Code available for payment source

## 2020-08-12 ENCOUNTER — Encounter: Payer: Self-pay | Admitting: Hematology & Oncology

## 2020-08-13 ENCOUNTER — Telehealth: Payer: Self-pay

## 2020-08-13 NOTE — Telephone Encounter (Signed)
In response to pts my chart/nirse message pts appt has been r/s from 08/17/20 and I left a vm with her new appt   Connie Myers

## 2020-08-17 ENCOUNTER — Ambulatory Visit: Payer: No Typology Code available for payment source | Admitting: Family

## 2020-08-17 ENCOUNTER — Other Ambulatory Visit: Payer: No Typology Code available for payment source

## 2020-08-26 ENCOUNTER — Ambulatory Visit: Payer: No Typology Code available for payment source | Admitting: Family

## 2020-08-26 ENCOUNTER — Encounter: Payer: Self-pay | Admitting: Family

## 2020-08-26 ENCOUNTER — Telehealth: Payer: Self-pay | Admitting: *Deleted

## 2020-08-26 ENCOUNTER — Inpatient Hospital Stay: Payer: No Typology Code available for payment source

## 2020-08-26 ENCOUNTER — Other Ambulatory Visit: Payer: No Typology Code available for payment source

## 2020-08-26 ENCOUNTER — Other Ambulatory Visit: Payer: Self-pay

## 2020-08-26 ENCOUNTER — Telehealth: Payer: Self-pay

## 2020-08-26 ENCOUNTER — Inpatient Hospital Stay: Payer: No Typology Code available for payment source | Attending: Hematology and Oncology

## 2020-08-26 ENCOUNTER — Inpatient Hospital Stay (HOSPITAL_BASED_OUTPATIENT_CLINIC_OR_DEPARTMENT_OTHER): Payer: No Typology Code available for payment source | Admitting: Family

## 2020-08-26 VITALS — BP 152/80 | HR 85 | Temp 98.8°F | Resp 19 | Ht 69.0 in | Wt 238.0 lb

## 2020-08-26 DIAGNOSIS — D45 Polycythemia vera: Secondary | ICD-10-CM | POA: Diagnosis present

## 2020-08-26 DIAGNOSIS — Z79899 Other long term (current) drug therapy: Secondary | ICD-10-CM | POA: Diagnosis not present

## 2020-08-26 DIAGNOSIS — D75839 Thrombocytosis, unspecified: Secondary | ICD-10-CM

## 2020-08-26 DIAGNOSIS — D75838 Other thrombocytosis: Secondary | ICD-10-CM | POA: Insufficient documentation

## 2020-08-26 DIAGNOSIS — D509 Iron deficiency anemia, unspecified: Secondary | ICD-10-CM | POA: Diagnosis not present

## 2020-08-26 DIAGNOSIS — Z1589 Genetic susceptibility to other disease: Secondary | ICD-10-CM

## 2020-08-26 DIAGNOSIS — Z7982 Long term (current) use of aspirin: Secondary | ICD-10-CM | POA: Insufficient documentation

## 2020-08-26 LAB — CBC WITH DIFFERENTIAL (CANCER CENTER ONLY)
Abs Immature Granulocytes: 0.04 10*3/uL (ref 0.00–0.07)
Basophils Absolute: 0.2 10*3/uL — ABNORMAL HIGH (ref 0.0–0.1)
Basophils Relative: 3 %
Eosinophils Absolute: 0.7 10*3/uL — ABNORMAL HIGH (ref 0.0–0.5)
Eosinophils Relative: 7 %
HCT: 46.3 % — ABNORMAL HIGH (ref 36.0–46.0)
Hemoglobin: 14.5 g/dL (ref 12.0–15.0)
Immature Granulocytes: 0 %
Lymphocytes Relative: 18 %
Lymphs Abs: 1.6 10*3/uL (ref 0.7–4.0)
MCH: 21.5 pg — ABNORMAL LOW (ref 26.0–34.0)
MCHC: 31.3 g/dL (ref 30.0–36.0)
MCV: 68.6 fL — ABNORMAL LOW (ref 80.0–100.0)
Monocytes Absolute: 0.8 10*3/uL (ref 0.1–1.0)
Monocytes Relative: 9 %
Neutro Abs: 5.7 10*3/uL (ref 1.7–7.7)
Neutrophils Relative %: 63 %
Platelet Count: 465 10*3/uL — ABNORMAL HIGH (ref 150–400)
RBC: 6.75 MIL/uL — ABNORMAL HIGH (ref 3.87–5.11)
RDW: 17.9 % — ABNORMAL HIGH (ref 11.5–15.5)
WBC Count: 9.1 10*3/uL (ref 4.0–10.5)
nRBC: 0 % (ref 0.0–0.2)

## 2020-08-26 LAB — IRON AND TIBC
Iron: 23 ug/dL — ABNORMAL LOW (ref 41–142)
Saturation Ratios: 6 % — ABNORMAL LOW (ref 21–57)
TIBC: 389 ug/dL (ref 236–444)
UIBC: 366 ug/dL (ref 120–384)

## 2020-08-26 LAB — CMP (CANCER CENTER ONLY)
ALT: 9 U/L (ref 0–44)
AST: 12 U/L — ABNORMAL LOW (ref 15–41)
Albumin: 4.2 g/dL (ref 3.5–5.0)
Alkaline Phosphatase: 88 U/L (ref 38–126)
Anion gap: 10 (ref 5–15)
BUN: 13 mg/dL (ref 8–23)
CO2: 27 mmol/L (ref 22–32)
Calcium: 9.3 mg/dL (ref 8.9–10.3)
Chloride: 104 mmol/L (ref 98–111)
Creatinine: 0.69 mg/dL (ref 0.44–1.00)
GFR, Estimated: >60 mL/min (ref >60)
Glucose, Bld: 100 mg/dL — ABNORMAL HIGH (ref 70–99)
Potassium: 4.2 mmol/L (ref 3.5–5.1)
Sodium: 141 mmol/L (ref 135–145)
Total Bilirubin: 0.7 mg/dL (ref 0.3–1.2)
Total Protein: 7.2 g/dL (ref 6.5–8.1)

## 2020-08-26 LAB — LACTATE DEHYDROGENASE: LDH: 172 U/L (ref 98–192)

## 2020-08-26 LAB — FERRITIN: Ferritin: 10 ng/mL — ABNORMAL LOW (ref 11–307)

## 2020-08-26 NOTE — Progress Notes (Signed)
Hematology and Oncology Follow Up Visit  Connie Myers [id number] December 21, 1960 60 y.o. 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 Lorenso Courier. JAK2 V617F mutation returned as positive.  01/16/2020: bone marrow biopsy confirms hypercellular marrow consistent with polycythemia vera.   Current Therapy: Phlebotomy to maintain Hct < 45% per MD 08/26/2020 Hydroxyurea 500 mg PO daily ASA 65m PO daily   Interim History:  Connie Myers here today for follow-up. She is doing fairly well but has had some stress with her new job but she is persevering! She continues to do water aerobics twice a week for exercise.  She is tolerating Hydrea well and taking daily as prescribed.  Hct is stable at 46.3%. her last phlebotomy was in December 2021. Platelets are 465 and WBC count 7.7.  She states that her hair is thinning and will follow-up with her PCP regarding thyroid testing.  She had an episodes of dizziness recently where the room start spinning. This lasted for 10-15 minutes before passing. She denies history of vertigo. She was advised to follow-up with her PCP if this happened again.  She states that she has history of silent migraines with floating shapes in the periphery that block her vision.  No fever, chills, n/v, cough, rash, SOB, chest pain, palpitations, abdominal pain or changes in bowel or bladder habits.  No blood loss, no abnormal bruising, no petechiae.  No swelling, tenderness, numbness or tingling in her extremities.  No falls or syncope to report.  She has maintained a good appetite and is staying well hydrated. Her weight is stable at 238 lbs.   ECOG Performance Status: 1 - Symptomatic but completely ambulatory  Medications:  Allergies as of 08/26/2020      Reactions   Codeine Nausea Only   Local [lidocaine]    Local Anesthesia does not work for her      Medication List        Accurate as of August 26, 2020  8:44 AM. If you have any questions, ask your nurse or doctor.        amLODIPine-Valsartan-HCTZ 10-160-25 MG Tabs 1 tablet   aspirin EC 81 MG tablet Take 81 mg by mouth daily. Swallow whole.   fexofenadine 180 MG tablet Commonly known as: ALLEGRA Take 180 mg by mouth daily as needed for allergies.   hydrochlorothiazide 25 MG tablet Commonly known as: HYDRODIURIL Take 1 tablet (25 mg total) by mouth daily.   hydroxyurea 500 MG capsule Commonly known as: HYDREA Take 1 capsule (500 mg total) by mouth daily. May take with food to minimize GI side effects.   ibuprofen 200 MG tablet Commonly known as: ADVIL Take 600 mg by mouth every 6 (six) hours as needed.   irbesartan 150 MG tablet Commonly known as: AVAPRO Take 150 mg by mouth daily.       Allergies:  Allergies  Allergen Reactions  . Codeine Nausea Only  . Local [Lidocaine]     Local Anesthesia does not work for her    Past Medical History, Surgical history, Social history, and Family History were reviewed and updated.  Review of Systems: All other 10 point review of systems is negative.   Physical Exam:  vitals were not taken for this visit.   Wt Readings from Last 3 Encounters:  05/28/20 239 lb (108.4 kg)  03/16/20 235 lb 8 oz (106.8 kg)  02/17/20 240 lb (108.9 kg)    Ocular:  Sclerae unicteric, pupils equal, round and reactive to light Ear-nose-throat: Oropharynx clear, dentition fair Lymphatic: No cervical or supraclavicular adenopathy Lungs no rales or rhonchi, good excursion bilaterally Heart regular rate and rhythm, no murmur appreciated Abd soft, nontender, positive bowel sounds, no liver or spleen tip palpated on exam, no fluid wave  MSK no focal spinal tenderness, no joint edema Neuro: non-focal, well-oriented, appropriate affect Breasts: Deferred  Lab Results  Component Value Date   WBC 9.1 08/26/2020   HGB 14.5 08/26/2020   HCT 46.3 (H) 08/26/2020   MCV  68.6 (L) 08/26/2020   PLT 465 (H) 08/26/2020   Lab Results  Component Value Date   FERRITIN 8 (L) 05/28/2020   IRON 21 (L) 05/28/2020   TIBC 415 05/28/2020   UIBC 394 (H) 05/28/2020   IRONPCTSAT 5 (L) 05/28/2020   Lab Results  Component Value Date   RETICCTPCT 1.4 12/19/2019   RBC 6.75 (H) 08/26/2020   No results found for: KPAFRELGTCHN, LAMBDASER, KAPLAMBRATIO No results found for: IGGSERUM, IGA, IGMSERUM No results found for: Ronnald Ramp, A1GS, A2GS, Violet Baldy, MSPIKE, SPEI   Chemistry      Component Value Date/Time   NA 139 05/28/2020 1331   K 4.2 05/28/2020 1331   CL 102 05/28/2020 1331   CO2 29 05/28/2020 1331   BUN 19 05/28/2020 1331   CREATININE 0.83 05/28/2020 1331      Component Value Date/Time   CALCIUM 9.8 05/28/2020 1331   ALKPHOS 87 05/28/2020 1331   AST 12 (L) 05/28/2020 1331   ALT 11 05/28/2020 1331   BILITOT 0.7 05/28/2020 1331       Impression and Plan: Connie Myers is a very pleasant 60 yo caucasian female with Polycythemia Vera, JAK2 positive, as well as reactive thrombocytosis due to iron deficiency.  She is tolerating Hydrea nicely and her counts at this time have remained stable.  I spoke with Dr. [name] regarding her Hct of 46% and for now we will hold her phlebotomy.  Iron studies are pending.  Follow-up in 4 months. She prefers 9 or 9:30 am and no Mondays for her schedule. She has very limited time off.  We will do a lab only and phlebotomy if needed in 8 weeks.  She was encouraged to contact our office with any questions or concerns.   Laverna Peace, NP 3/9/20228:44 AM

## 2020-08-26 NOTE — Telephone Encounter (Signed)
Called patient and gave upcoming appts. From scheduled message - sarah 08/26/20

## 2020-08-26 NOTE — Telephone Encounter (Signed)
appts made per 08/26/20 los, pt placed in her phone   Exavier Lina

## 2020-09-11 ENCOUNTER — Telehealth: Payer: Self-pay

## 2020-09-11 NOTE — Telephone Encounter (Signed)
Pt called in to r/s her appts   Connie Myers 

## 2020-09-15 ENCOUNTER — Encounter: Payer: Self-pay | Admitting: Hematology & Oncology

## 2020-09-15 ENCOUNTER — Other Ambulatory Visit: Payer: Self-pay | Admitting: Hematology and Oncology

## 2020-09-16 ENCOUNTER — Other Ambulatory Visit: Payer: Self-pay | Admitting: *Deleted

## 2020-09-16 MED ORDER — HYDROXYUREA 500 MG PO CAPS: 500.0000 mg | ORAL_CAPSULE | Freq: Every day | ORAL | 1 refills | Status: DC

## 2020-10-26 ENCOUNTER — Other Ambulatory Visit: Payer: Self-pay | Admitting: Family Medicine

## 2020-10-26 DIAGNOSIS — Z1231 Encounter for screening mammogram for malignant neoplasm of breast: Secondary | ICD-10-CM

## 2020-10-27 ENCOUNTER — Other Ambulatory Visit: Payer: No Typology Code available for payment source

## 2020-10-28 ENCOUNTER — Other Ambulatory Visit: Payer: No Typology Code available for payment source

## 2020-10-29 ENCOUNTER — Other Ambulatory Visit: Payer: Self-pay

## 2020-10-29 ENCOUNTER — Inpatient Hospital Stay: Payer: No Typology Code available for payment source

## 2020-10-29 ENCOUNTER — Telehealth: Payer: Self-pay

## 2020-10-29 ENCOUNTER — Inpatient Hospital Stay: Payer: No Typology Code available for payment source | Attending: Hematology and Oncology

## 2020-10-29 DIAGNOSIS — D75839 Thrombocytosis, unspecified: Secondary | ICD-10-CM

## 2020-10-29 DIAGNOSIS — Z7982 Long term (current) use of aspirin: Secondary | ICD-10-CM | POA: Diagnosis not present

## 2020-10-29 DIAGNOSIS — D45 Polycythemia vera: Secondary | ICD-10-CM | POA: Diagnosis present

## 2020-10-29 DIAGNOSIS — Z79899 Other long term (current) drug therapy: Secondary | ICD-10-CM | POA: Diagnosis not present

## 2020-10-29 DIAGNOSIS — D509 Iron deficiency anemia, unspecified: Secondary | ICD-10-CM | POA: Insufficient documentation

## 2020-10-29 DIAGNOSIS — D75838 Other thrombocytosis: Secondary | ICD-10-CM | POA: Insufficient documentation

## 2020-10-29 DIAGNOSIS — Z1589 Genetic susceptibility to other disease: Secondary | ICD-10-CM

## 2020-10-29 LAB — CBC WITH DIFFERENTIAL (CANCER CENTER ONLY)
Abs Immature Granulocytes: 0.05 10*3/uL (ref 0.00–0.07)
Basophils Absolute: 0.2 10*3/uL — ABNORMAL HIGH (ref 0.0–0.1)
Basophils Relative: 3 %
Eosinophils Absolute: 0.4 10*3/uL (ref 0.0–0.5)
Eosinophils Relative: 6 %
HCT: 45.9 % (ref 36.0–46.0)
Hemoglobin: 14.1 g/dL (ref 12.0–15.0)
Immature Granulocytes: 1 %
Lymphocytes Relative: 18 %
Lymphs Abs: 1.4 10*3/uL (ref 0.7–4.0)
MCH: 20.6 pg — ABNORMAL LOW (ref 26.0–34.0)
MCHC: 30.7 g/dL (ref 30.0–36.0)
MCV: 67.2 fL — ABNORMAL LOW (ref 80.0–100.0)
Monocytes Absolute: 0.6 10*3/uL (ref 0.1–1.0)
Monocytes Relative: 8 %
Neutro Abs: 5.1 10*3/uL (ref 1.7–7.7)
Neutrophils Relative %: 64 %
Platelet Count: 621 10*3/uL — ABNORMAL HIGH (ref 150–400)
RBC: 6.83 MIL/uL — ABNORMAL HIGH (ref 3.87–5.11)
RDW: 17.6 % — ABNORMAL HIGH (ref 11.5–15.5)
WBC Count: 7.8 10*3/uL (ref 4.0–10.5)
nRBC: 0 % (ref 0.0–0.2)

## 2020-10-29 LAB — CMP (CANCER CENTER ONLY)
ALT: 9 U/L (ref 0–44)
AST: 10 U/L — ABNORMAL LOW (ref 15–41)
Albumin: 4 g/dL (ref 3.5–5.0)
Alkaline Phosphatase: 83 U/L (ref 38–126)
Anion gap: 8 (ref 5–15)
BUN: 14 mg/dL (ref 8–23)
CO2: 28 mmol/L (ref 22–32)
Calcium: 9.2 mg/dL (ref 8.9–10.3)
Chloride: 103 mmol/L (ref 98–111)
Creatinine: 0.67 mg/dL (ref 0.44–1.00)
GFR, Estimated: >60 mL/min (ref >60)
Glucose, Bld: 112 mg/dL — ABNORMAL HIGH (ref 70–99)
Potassium: 3.7 mmol/L (ref 3.5–5.1)
Sodium: 139 mmol/L (ref 135–145)
Total Bilirubin: 0.6 mg/dL (ref 0.3–1.2)
Total Protein: 6.9 g/dL (ref 6.5–8.1)

## 2020-10-29 LAB — LACTATE DEHYDROGENASE: LDH: 167 U/L (ref 98–192)

## 2020-10-29 NOTE — Telephone Encounter (Signed)
No phlebotomy needed today per MD with HCt at 56.9. pt informed

## 2020-10-30 LAB — FERRITIN: Ferritin: 11 ng/mL (ref 11–307)

## 2020-10-30 LAB — IRON AND TIBC
Iron: 13 ug/dL — ABNORMAL LOW (ref 41–142)
Saturation Ratios: 3 % — ABNORMAL LOW (ref 21–57)
TIBC: 391 ug/dL (ref 236–444)
UIBC: 378 ug/dL (ref 120–384)

## 2020-12-17 ENCOUNTER — Encounter: Payer: Self-pay | Admitting: Hematology and Oncology

## 2020-12-24 ENCOUNTER — Ambulatory Visit
Admission: RE | Admit: 2020-12-24 | Discharge: 2020-12-24 | Disposition: A | Discharge status: Home / Self Care | Payer: No Typology Code available for payment source | Source: Ambulatory Visit | Attending: Family Medicine | Admitting: Family Medicine

## 2020-12-24 ENCOUNTER — Other Ambulatory Visit: Payer: Self-pay

## 2020-12-24 DIAGNOSIS — Z1231 Encounter for screening mammogram for malignant neoplasm of breast: Secondary | ICD-10-CM

## 2020-12-30 ENCOUNTER — Encounter: Payer: Self-pay | Admitting: Hematology & Oncology

## 2020-12-30 ENCOUNTER — Ambulatory Visit: Payer: No Typology Code available for payment source | Admitting: Family

## 2020-12-30 ENCOUNTER — Inpatient Hospital Stay: Payer: No Typology Code available for payment source

## 2020-12-30 ENCOUNTER — Telehealth: Payer: Self-pay

## 2020-12-30 ENCOUNTER — Inpatient Hospital Stay: Payer: No Typology Code available for payment source | Admitting: Family

## 2020-12-30 ENCOUNTER — Other Ambulatory Visit: Payer: No Typology Code available for payment source

## 2021-01-14 NOTE — Progress Notes (Signed)
Date:  01/15/2021   ID:  Connie Myers, DOB 1961-04-19, MRN [id number]  PCP:  [name], MD  Cardiologist:  Rex Kras, DO, [facility] (established care 01/15/2021)  REASON FOR CONSULT: Palpitations  REQUESTING PHYSICIAN:  Kristen Loader, [place],  [place] [id number]  Chief Complaint  Patient presents with   Palpitations   New Patient (Initial Visit)    HPI  Connie Myers is a 60 y.o. female who presents to the office with a chief complaint of " palpitations." Patient's past medical history and cardiovascular risk factors include: Prior diagnosis of sleep apnea not on CPAP, obesity due to excess calories, polycythemia vera, hypertension, postmenopausal female, family history of premature CAD, obesity due to excess calories.  She is referred to the office at the request of Kristen Loader, FNP for evaluation of palpitations.  Palpitations: Patient states that she has been having palpitations for the last 10 to 15 years but has noticed them to be more progressive and happening on a daily basis.  Usually present during the evening hours, sudden onset, lasting for seconds, self-limited, no improving or worsening factors, no precipitating factors.  No near-syncope or syncopal events.  She denies use of over-the-counter medications, no new prescription medications, no herbal supplements, no weight loss supplements, no stimulants, no energy drink consumption, no sodas or illicits..  She consumes approximately 25 ounces of coffee (caffeinated) in the morning and decaffeinated tea at times during the evening hours but nothing after 3 PM.    No known thyroid disease.  Questionable history of anemia in the past.  Currently being treated for polycythemia vera.  Has a prior diagnosis of benign essential hypertension.  Currently on medical therapy.  She brings in a blood pressure log for review for the last 5 days SBP ranges between 123-139 mmHg, DBP <80 mmHg, and  average heart rate of 80 bpm.  Family history of premature CAD with mom having a myocardial infarction at the age of 6.  FUNCTIONAL STATUS: Water aerobic for an hour 3-4 times a week.  ALLERGIES: Allergies  Allergen Reactions   Codeine Nausea Only   Local [Lidocaine] Other (See Comments)    Local Anesthesia does not work for her    MEDICATION LIST PRIOR TO VISIT: Current Meds  Medication Sig   aspirin EC 81 MG tablet Take 81 mg by mouth daily. Swallow whole.   hydrochlorothiazide (HYDRODIURIL) 25 MG tablet Take 1 tablet (25 mg total) by mouth daily.   hydroxyurea (HYDREA) 500 MG capsule Take 1 capsule (500 mg total) by mouth daily. May take with food to minimize GI side effects.   ibuprofen (ADVIL) 200 MG tablet Take 600 mg by mouth every 6 (six) hours as needed.   irbesartan (AVAPRO) 150 MG tablet Take 1 tablet by mouth daily.     PAST MEDICAL HISTORY: Past Medical History:  Diagnosis Date   Anemia    Arthritis    Complication of anesthesia    " I don't tolerate the caine's (Novacaine, Lidocaine etc. well " also, no codiene.    GERD (gastroesophageal reflux disease)    Headache    Hypertension    Sleep apnea    PMH: " I don't have it now "   Ventral hernia    Wears glasses     PAST SURGICAL HISTORY: Past Surgical History:  Procedure Laterality Date   CESAREAN SECTION     CHOLECYSTECTOMY     COLONOSCOPY  HERNIA REPAIR     INSERTION OF MESH N/A 12/05/2017   Procedure: INSERTION OF MESH;  Surgeon: Donnie Mesa, MD;  Location: [place];  Service: General;  Laterality: N/A;   VENTRAL HERNIA REPAIR  12/05/2017   OPEN VENTRAL HERNIA REPAIR ERAS PATHWAY w/mesh   VENTRAL HERNIA REPAIR N/A 12/05/2017   Procedure: OPEN VENTRAL HERNIA REPAIR ERAS PATHWAY;  Surgeon: Donnie Mesa, MD;  Location: [place];  Service: General;  Laterality: N/A;   VENTRAL HERNIA REPAIR  01/01/2019   VENTRAL HERNIA REPAIR N/A 01/01/2019   Procedure: LAPAROSCOPIC VENTRAL HERNIA REPAIR WITH MESH;   Surgeon: Donnie Mesa, MD;  Location: [place];  Service: General;  Laterality: N/A;   WISDOM TOOTH EXTRACTION     WOUND EXPLORATION N/A 12/29/2017   Procedure: ABDOMINAL WOUND EXPLORATION;  Surgeon: Donnie Mesa, MD;  Location: [place];  Service: General;  Laterality: N/A;  LMA    FAMILY HISTORY: The patient family history includes Breast cancer in her mother; Heart attack in her mother; Lupus in her sister.  SOCIAL HISTORY:  The patient  reports that she has never smoked. She has never used smokeless tobacco. She reports current alcohol use. She reports previous drug use.  REVIEW OF SYSTEMS: Review of Systems  Constitutional: Negative for chills and fever.  HENT:  Negative for hoarse voice and nosebleeds.   Eyes:  Negative for discharge, double vision and pain.  Cardiovascular:  Positive for palpitations. Negative for chest pain, claudication, dyspnea on exertion, leg swelling, near-syncope, orthopnea, paroxysmal nocturnal dyspnea and syncope.  Respiratory:  Negative for hemoptysis and shortness of breath.   Musculoskeletal:  Negative for muscle cramps and myalgias.  Gastrointestinal:  Negative for abdominal pain, constipation, diarrhea, hematemesis, hematochezia, melena, nausea and vomiting.  Neurological:  Negative for dizziness and light-headedness.   PHYSICAL EXAM: Vitals with BMI 01/15/2021 08/26/2020 05/28/2020  Height '5\' 9"'$  '5\' 9"'$  -  Weight 235 lbs 238 lbs -  BMI [id number] -  Systolic 0000000 0000000 [id number]  Diastolic 83 80 58  Pulse 76 85 66   CONSTITUTIONAL: Well-developed and well-nourished. No acute distress.  SKIN: Skin is warm and dry. No rash noted. No cyanosis. No pallor. No jaundice HEAD: Normocephalic and atraumatic.  EYES: No scleral icterus MOUTH/THROAT: Moist oral membranes.  NECK: No JVD present. No thyromegaly noted. No carotid bruits  LYMPHATIC: No visible cervical adenopathy.  CHEST Normal respiratory effort. No intercostal retractions  LUNGS: Clear to auscultation  bilaterally. No stridor. No wheezes. No rales.  CARDIOVASCULAR: Regular rate and rhythm, positive S1-S2, no murmurs rubs or gallops appreciated. ABDOMINAL: Obese, soft, nontender, nondistended, positive bowel sounds all 4 quadrants. No apparent ascites.  EXTREMITIES: No peripheral edema, 2+ dorsalis pedis and posterior tibial pulses. HEMATOLOGIC: No significant bruising NEUROLOGIC: Oriented to person, place, and time. Nonfocal. Normal muscle tone.  PSYCHIATRIC: Normal mood and affect. Normal behavior. Cooperative  CARDIAC DATABASE: EKG: 01/15/2021: Normal sinus rhythm, 74 beats LAE, without underlying ischemia or injury pattern.    Echocardiogram: No results found for this or any previous visit from the past 1095 days.   Stress Testing: No results found for this or any previous visit from the past 1095 days.  Heart Catheterization: None  LABORATORY DATA: CBC Latest Ref Rng & Units 10/29/2020 08/26/2020 05/28/2020  WBC 4.0 - 10.5 K/uL 7.8 9.1 10.0  Hemoglobin 12.0 - 15.0 g/dL 14.1 14.5 14.3  Hematocrit 36.0 - 46.0 % 45.9 46.3(H) 46.6(H)  Platelets 150 - 400 K/uL 621(H) 465(H) 465(H)    CMP  Latest Ref Rng & Units 10/29/2020 08/26/2020 05/28/2020  Glucose 70 - 99 mg/dL 112(H) 100(H) 92  BUN 8 - 23 mg/dL '14 13 19  '$ Creatinine 0.44 - 1.00 mg/dL 0.67 0.69 0.83  Sodium 135 - 145 mmol/L 139 141 139  Potassium 3.5 - 5.1 mmol/L 3.7 4.2 4.2  Chloride 98 - 111 mmol/L 103 104 102  CO2 22 - 32 mmol/L '28 27 29  '$ Calcium 8.9 - 10.3 mg/dL 9.2 9.3 9.8  Total Protein 6.5 - 8.1 g/dL 6.9 7.2 7.5  Total Bilirubin 0.3 - 1.2 mg/dL 0.6 0.7 0.7  Alkaline Phos 38 - 126 U/L 83 88 87  AST 15 - 41 U/L 10(L) 12(L) 12(L)  ALT 0 - 44 U/L '9 9 11    '$ Lipid Panel  No results found for: CHOL, TRIG, HDL, CHOLHDL, VLDL, LDLCALC, LDLDIRECT, LABVLDL  No components found for: NTPROBNP No results for input(s): PROBNP in the last 8760 hours. No results for input(s): TSH in the last 8760 hours.  BMP Recent Labs     01/29/20 0931 02/17/20 1032 03/16/20 1157 05/28/20 1331 08/26/20 0823 10/29/20 0816  NA 140 140 139 139 141 139  K 4.1 4.0 3.9 4.2 4.2 3.7  CL 105 106 104 102 104 103  CO2 '26 26 27 29 27 28  '$ GLUCOSE 93 105* 101* 92 100* 112*  BUN '12 13 14 19 13 14  '$ CREATININE 0.73 0.63 0.68 0.83 0.69 0.67  CALCIUM 9.5 9.3 9.5 9.8 9.3 9.2  GFRNONAA >60 >60 >60 >60 >60 >60  GFRAA >60 >60 >60  --   --   --     HEMOGLOBIN A1C No results found for: HGBA1C, MPG  External Labs: Collected: 12/02/2019 Hemoglobin 16.8 g/dL.  Hematocrit 51.4%. Platelets 997 Total cholesterol 124, triglycerides 96, HDL 45, LDL 61, non-HDL 79  IMPRESSION:    ICD-10-CM   1. Palpitations  R00.2 EKG 12-Lead    LONG TERM MONITOR (3-14 DAYS)    Basic metabolic panel    Magnesium    TSH    2. Benign hypertension  I10 PCV ECHOCARDIOGRAM COMPLETE    3. Family history of premature CAD  Z82.49 CT CARDIAC SCORING ([facility] LOCATIONS ONLY)    4. Class 1 obesity due to excess calories without serious comorbidity with body mass index (BMI) of 34.0 to 34.9 in adult  E66.09    Z68.34        RECOMMENDATIONS: Connie Myers is a 60 y.o. female whose past medical history and cardiac risk factors include: Prior diagnosis of sleep apnea not on CPAP, obesity due to excess calories, polycythemia vera, hypertension, postmenopausal female, family history of premature CAD, obesity due to excess calories.  Palpitations: Will check BMP, magnesium, and TSH level. No identifiable reversible causes. 14-day extended Holter monitor to evaluate for underlying dysrhythmias.  Benign essential hypertension: Would recommend a SBP goal of 120 mmHg if able to tolerate given her chronic comorbidities and age. Medications reconciled Blood pressure log reviewed. Low-salt diet recommended Echocardiogram will be ordered to evaluate for structural heart disease and left ventricular systolic function.  Family history of premature CAD: From a  cardiovascular standpoint would recommend a coronary calcium score for further risk stratification. Depending on the CAC will discuss the need for stress testing at the next office visit.  Patient is agreeable with the plan of care and is thankful for the consultation.  Total encounter time 58 minutes.   *Total Encounter Time as defined by the Centers for Medicare and  Medicaid Services includes, in addition to the face-to-face time of a patient visit (documented in the note above) non-face-to-face time: obtaining and reviewing outside history, ordering and reviewing medications, tests or procedures, care coordination (communications with other health care professionals or caregivers) and documentation in the medical record.  FINAL MEDICATION LIST END OF ENCOUNTER: No orders of the defined types were placed in this encounter.   There are no discontinued medications.   Current Outpatient Medications:    aspirin EC 81 MG tablet, Take 81 mg by mouth daily. Swallow whole., Disp: , Rfl:    hydrochlorothiazide (HYDRODIURIL) 25 MG tablet, Take 1 tablet (25 mg total) by mouth daily., Disp: 30 tablet, Rfl: 0   hydroxyurea (HYDREA) 500 MG capsule, Take 1 capsule (500 mg total) by mouth daily. May take with food to minimize GI side effects., Disp: 90 capsule, Rfl: 1   ibuprofen (ADVIL) 200 MG tablet, Take 600 mg by mouth every 6 (six) hours as needed., Disp: , Rfl:    irbesartan (AVAPRO) 150 MG tablet, Take 1 tablet by mouth daily., Disp: , Rfl:   Orders Placed This Encounter  Procedures   CT CARDIAC SCORING ([facility] LOCATIONS ONLY)   Basic metabolic panel   Magnesium   TSH   LONG TERM MONITOR (3-14 DAYS)   EKG 12-Lead   PCV ECHOCARDIOGRAM COMPLETE    There are no Patient Instructions on file for this visit.   --Continue cardiac medications as reconciled in final medication list. --Return in about 6 weeks (around 02/26/2021) for Follow up, Palpitations, Review test results. Or sooner if  needed. --Continue follow-up with your primary care physician regarding the management of your other chronic comorbid conditions.  Patient's questions and concerns were addressed to her satisfaction. She voices understanding of the instructions provided during this encounter.   This note was created using a voice recognition software as a result there may be grammatical errors inadvertently enclosed that do not reflect the nature of this encounter. Every attempt is made to correct such errors.  Rex Kras, [place], [facility]  Pager: [phone] Office: [phone]

## 2021-01-15 ENCOUNTER — Other Ambulatory Visit: Payer: Self-pay

## 2021-01-15 ENCOUNTER — Ambulatory Visit: Payer: No Typology Code available for payment source | Admitting: Cardiology

## 2021-01-15 ENCOUNTER — Encounter: Payer: Self-pay | Admitting: Cardiology

## 2021-01-15 VITALS — BP 138/83 | HR 76 | Temp 98.3°F | Resp 16 | Ht 69.0 in | Wt 235.0 lb

## 2021-01-15 DIAGNOSIS — Z8249 Family history of ischemic heart disease and other diseases of the circulatory system: Secondary | ICD-10-CM

## 2021-01-15 DIAGNOSIS — Z6834 Body mass index (BMI) 34.0-34.9, adult: Secondary | ICD-10-CM

## 2021-01-15 DIAGNOSIS — E6609 Other obesity due to excess calories: Secondary | ICD-10-CM

## 2021-01-15 DIAGNOSIS — R002 Palpitations: Secondary | ICD-10-CM

## 2021-01-15 DIAGNOSIS — I10 Essential (primary) hypertension: Secondary | ICD-10-CM

## 2021-01-18 ENCOUNTER — Encounter: Payer: Self-pay | Admitting: Family

## 2021-01-19 ENCOUNTER — Other Ambulatory Visit: Payer: Self-pay

## 2021-01-19 ENCOUNTER — Telehealth: Payer: Self-pay

## 2021-01-19 ENCOUNTER — Encounter: Payer: Self-pay | Admitting: Family

## 2021-01-19 ENCOUNTER — Inpatient Hospital Stay: Payer: No Typology Code available for payment source

## 2021-01-19 ENCOUNTER — Inpatient Hospital Stay: Payer: No Typology Code available for payment source | Attending: Hematology and Oncology

## 2021-01-19 ENCOUNTER — Inpatient Hospital Stay (HOSPITAL_BASED_OUTPATIENT_CLINIC_OR_DEPARTMENT_OTHER): Payer: No Typology Code available for payment source | Admitting: Family

## 2021-01-19 VITALS — BP 117/70 | HR 69

## 2021-01-19 VITALS — BP 145/66 | HR 90 | Temp 98.4°F | Resp 18 | Ht 69.0 in | Wt 235.0 lb

## 2021-01-19 DIAGNOSIS — D75838 Other thrombocytosis: Secondary | ICD-10-CM | POA: Insufficient documentation

## 2021-01-19 DIAGNOSIS — D45 Polycythemia vera: Secondary | ICD-10-CM

## 2021-01-19 DIAGNOSIS — Z1589 Genetic susceptibility to other disease: Secondary | ICD-10-CM

## 2021-01-19 DIAGNOSIS — D75839 Thrombocytosis, unspecified: Secondary | ICD-10-CM

## 2021-01-19 LAB — CBC WITH DIFFERENTIAL (CANCER CENTER ONLY)
Abs Immature Granulocytes: 0.07 10*3/uL (ref 0.00–0.07)
Basophils Absolute: 0.2 10*3/uL — ABNORMAL HIGH (ref 0.0–0.1)
Basophils Relative: 2 %
Eosinophils Absolute: 0.8 10*3/uL — ABNORMAL HIGH (ref 0.0–0.5)
Eosinophils Relative: 7 %
HCT: 47.6 % — ABNORMAL HIGH (ref 36.0–46.0)
Hemoglobin: 14.9 g/dL (ref 12.0–15.0)
Immature Granulocytes: 1 %
Lymphocytes Relative: 18 %
Lymphs Abs: 1.9 10*3/uL (ref 0.7–4.0)
MCH: 20.7 pg — ABNORMAL LOW (ref 26.0–34.0)
MCHC: 31.3 g/dL (ref 30.0–36.0)
MCV: 66.1 fL — ABNORMAL LOW (ref 80.0–100.0)
Monocytes Absolute: 1 10*3/uL (ref 0.1–1.0)
Monocytes Relative: 10 %
Neutro Abs: 6.5 10*3/uL (ref 1.7–7.7)
Neutrophils Relative %: 62 %
Platelet Count: 855 10*3/uL — ABNORMAL HIGH (ref 150–400)
RBC: 7.2 MIL/uL — ABNORMAL HIGH (ref 3.87–5.11)
RDW: 19.2 % — ABNORMAL HIGH (ref 11.5–15.5)
WBC Count: 10.4 10*3/uL (ref 4.0–10.5)
nRBC: 0 % (ref 0.0–0.2)

## 2021-01-19 LAB — LACTATE DEHYDROGENASE: LDH: 233 U/L — ABNORMAL HIGH (ref 98–192)

## 2021-01-19 LAB — CMP (CANCER CENTER ONLY)
ALT: 11 U/L (ref 0–44)
AST: 13 U/L — ABNORMAL LOW (ref 15–41)
Albumin: 4.2 g/dL (ref 3.5–5.0)
Alkaline Phosphatase: 89 U/L (ref 38–126)
Anion gap: 9 (ref 5–15)
BUN: 12 mg/dL (ref 8–23)
CO2: 27 mmol/L (ref 22–32)
Calcium: 9.5 mg/dL (ref 8.9–10.3)
Chloride: 103 mmol/L (ref 98–111)
Creatinine: 0.7 mg/dL (ref 0.44–1.00)
GFR, Estimated: >60 mL/min (ref >60)
Glucose, Bld: 109 mg/dL — ABNORMAL HIGH (ref 70–99)
Potassium: 4 mmol/L (ref 3.5–5.1)
Sodium: 139 mmol/L (ref 135–145)
Total Bilirubin: 0.8 mg/dL (ref 0.3–1.2)
Total Protein: 7.5 g/dL (ref 6.5–8.1)

## 2021-01-19 NOTE — Telephone Encounter (Signed)
Appts made and printed for pt  Connie Myers

## 2021-01-19 NOTE — Patient Instructions (Signed)
Therapeutic Phlebotomy Therapeutic phlebotomy is the planned removal of blood from a person's body for the purpose of treating a medical condition. The procedure is similar to donating blood. Usually, about a pint (470 mL, or 0.47 L) of blood is removed.The average adult has 9-12 pints (4.3-5.7 L) of blood in the body. Therapeutic phlebotomy may be used to treat the following medical conditions: Hemochromatosis. This is a condition in which the blood contains too much iron. Polycythemia vera. This is a condition in which the blood contains too many red blood cells. Porphyria cutanea tarda. This is a disease in which an important part of hemoglobin is not made properly. It results in the buildup of abnormal amounts of porphyrins in the body. Sickle cell disease. This is a condition in which the red blood cells form an abnormal crescent shape rather than a round shape. Tell a health care provider about: Any allergies you have. All medicines you are taking, including vitamins, herbs, eye drops, creams, and over-the-counter medicines. Any problems you or family members have had with anesthetic medicines. Any blood disorders you have. Any surgeries you have had. Any medical conditions you have. Whether you are pregnant or may be pregnant. What are the risks? Generally, this is a safe procedure. However, problems may occur, including: Nausea or light-headedness. Low blood pressure (hypotension). Soreness, bleeding, swelling, or bruising at the needle insertion site. Infection. What happens before the procedure? Follow instructions from your health care provider about eating or drinking restrictions. Ask your health care provider about: Changing or stopping your regular medicines. This is especially important if you are taking diabetes medicines or blood thinners (anticoagulants). Taking medicines such as aspirin and ibuprofen. These medicines can thin your blood. Do not take these medicines unless  your health care provider tells you to take them. Taking over-the-counter medicines, vitamins, herbs, and supplements. Wear clothing with sleeves that can be raised above the elbow. Plan to have someone take you home from the hospital or clinic. You may have a blood sample taken. Your blood pressure, pulse rate, and breathing rate will be measured. What happens during the procedure?  To lower your risk of infection: Your health care team will wash or sanitize their hands. Your skin will be cleaned with an antiseptic. You may be given a medicine to numb the area (local anesthetic). A tourniquet will be placed on your arm. A needle will be inserted into one of your veins. Tubing and a collection bag will be attached to that needle. Blood will flow through the needle and tubing into the collection bag. The collection bag will be placed lower than your arm to allow gravity to help the flow of blood into the bag. You may be asked to open and close your hand slowly and continually during the entire collection. After the specified amount of blood has been removed from your body, the collection bag and tubing will be clamped. The needle will be removed from your vein. Pressure will be held on the site of the needle insertion to stop the bleeding. A bandage (dressing) will be placed over the needle insertion site. The procedure may vary among health care providers and hospitals. What happens after the procedure? Your blood pressure, pulse rate, and breathing rate will be measured after the procedure. You will be encouraged to drink fluids. Your recovery will be assessed and monitored. You can return to your normal activities as told by your health care provider. Summary Therapeutic phlebotomy is the planned removal of   blood from a person's body for the purpose of treating a medical condition. Therapeutic phlebotomy may be used to treat hemochromatosis, polycythemia vera, porphyria cutanea tarda,  or sickle cell disease. In the procedure, a needle is inserted and about a pint (470 mL, or 0.47 L) of blood is removed. The average adult has 9-12 pints (4.3-5.7 L) of blood in the body. This is generally a safe procedure, but it can sometimes cause problems such as nausea, light-headedness, or low blood pressure (hypotension). This information is not intended to replace advice given to you by your health care provider. Make sure you discuss any questions you have with your healthcare provider. Document Revised: 06/22/2017 Document Reviewed: 06/22/2017 Elsevier Patient Education  2022 Elsevier Inc.  

## 2021-01-19 NOTE — Progress Notes (Signed)
Connie Myers presents today for phlebotomy per MD orders. Phlebotomy procedure started at 1235 and ended at 1241. 530 grams removed via 16 gauge needle to left AC using phlebotomy kit Patient observed for 30 minutes after procedure without any incident. Patient tolerated procedure well. IV needle removed intact.

## 2021-01-19 NOTE — Progress Notes (Signed)
Hematology and Oncology Follow Up Visit  Connie Myers [id number] [birth year] 60 y.o. 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 Lorenso Courier. JAK2 V617F mutation returned as positive. 01/16/2020: bone marrow biopsy confirms hypercellular marrow consistent with polycythemia vera.    Current Therapy: Phlebotomy to maintain Hct < 45% per MD 08/26/2020 Hydroxyurea 500 mg PO daily ASA 23m PO daily   Interim History:  Connie Myers here today for follow-up. She is under a lot of stress with work and has not been taking her Hydrea every day.  WBC count is 10.4, Hgb 14.9, Hct 47% and platelets 855.  She has noted her hair thinning a little. She will try taking a Biotin supplement daily.  No fever, chills, n/v, cough, rash, dizziness, SOB, chest pain, abdominal pain or changes in bowel or bladder habits.  She is following up with her cardiologist for HTN and palpitations and will be wearing a Holter monitor for several weeks as part of her work-up. She states that her mother had history of heart failure.  No swelling, tenderness, numbness or tingling in her extremities at this time.  No falls or syncope to report.  She is eating well and doing her best to stay well hydrated. Her weight is stable at 235 lbs.   ECOG Performance Status: 1 - Symptomatic but completely ambulatory  Medications:  Allergies as of 01/19/2021       Reactions   Codeine Nausea Only   Local [lidocaine] Other (See Comments)   Local Anesthesia does not work for her        Medication List        Accurate as of January 19, 2021 11:40 AM. If you have any questions, ask your nurse or doctor.          aspirin EC 81 MG tablet Take 81 mg by mouth daily. Swallow whole.   hydrochlorothiazide 25 MG tablet Commonly known as: HYDRODIURIL Take 1 tablet (25 mg total) by mouth daily.   hydroxyurea 500 MG capsule Commonly  known as: HYDREA Take 1 capsule (500 mg total) by mouth daily. May take with food to minimize GI side effects.   ibuprofen 200 MG tablet Commonly known as: ADVIL Take 600 mg by mouth every 6 (six) hours as needed.   irbesartan 150 MG tablet Commonly known as: AVAPRO Take 1 tablet by mouth daily.        Allergies:  Allergies  Allergen Reactions   Codeine Nausea Only   Local [Lidocaine] Other (See Comments)    Local Anesthesia does not work for her    Past Medical History, Surgical history, Social history, and Family History were reviewed and updated.  Review of Systems: All other 10 point review of systems is negative.   Physical Exam:  vitals were not taken for this visit.   Wt Readings from Last 3 Encounters:  01/15/21 235 lb (106.6 kg)  08/26/20 238 lb (108 kg)  05/28/20 239 lb (108.4 kg)    Ocular: Sclerae unicteric, pupils equal, round and reactive to light Ear-nose-throat: Oropharynx clear, dentition fair Lymphatic: No cervical or supraclavicular adenopathy Lungs no rales or rhonchi, good excursion bilaterally Heart regular rate and rhythm, no murmur appreciated Abd soft, nontender, positive bowel sounds MSK no focal spinal tenderness, no joint edema Neuro: non-focal, well-oriented, appropriate affect Breasts: Deferred   Lab Results  Component Value Date   WBC 7.8 10/29/2020   HGB 14.1 10/29/2020  HCT 45.9 10/29/2020   MCV 67.2 (L) 10/29/2020   PLT 621 (H) 10/29/2020   Lab Results  Component Value Date   FERRITIN 11 10/29/2020   IRON 13 (L) 10/29/2020   TIBC 391 10/29/2020   UIBC 378 10/29/2020   IRONPCTSAT 3 (L) 10/29/2020   Lab Results  Component Value Date   RETICCTPCT 1.4 12/19/2019   RBC 6.83 (H) 10/29/2020   No results found for: KPAFRELGTCHN, LAMBDASER, KAPLAMBRATIO No results found for: IGGSERUM, IGA, IGMSERUM No results found for: Odetta Pink, SPEI   Chemistry      Component  Value Date/Time   NA 139 10/29/2020 0816   K 3.7 10/29/2020 0816   CL 103 10/29/2020 0816   CO2 28 10/29/2020 0816   BUN 14 10/29/2020 0816   CREATININE 0.67 10/29/2020 0816      Component Value Date/Time   CALCIUM 9.2 10/29/2020 0816   ALKPHOS 83 10/29/2020 0816   AST 10 (L) 10/29/2020 0816   ALT 9 10/29/2020 0816   BILITOT 0.6 10/29/2020 0816       Impression and Plan: Connie Myers is a very pleasant 60 yo caucasian female with Polycythemia Vera, JAK2 positive, as well as reactive thrombocytosis due to iron deficiency. We will proceed with phlebotomy today for Hct 47.6%.  She will start taking her Hydrea 500 mg PO daily as prescribed.  Lab and phlebotomy only in 6 weeks and follow-up with lab and phlebotomy in 12 weeks.  She can contact our office with any questions or concerns.   Laverna Peace, NP 8/2/202211:40 AM

## 2021-01-20 ENCOUNTER — Encounter: Payer: Self-pay | Admitting: Hematology and Oncology

## 2021-01-20 ENCOUNTER — Ambulatory Visit: Payer: No Typology Code available for payment source

## 2021-01-20 ENCOUNTER — Inpatient Hospital Stay: Payer: No Typology Code available for payment source

## 2021-01-20 DIAGNOSIS — R002 Palpitations: Secondary | ICD-10-CM

## 2021-01-20 DIAGNOSIS — I10 Essential (primary) hypertension: Secondary | ICD-10-CM

## 2021-01-20 LAB — IRON AND TIBC
Iron: 18 ug/dL — ABNORMAL LOW (ref 41–142)
Saturation Ratios: 4 % — ABNORMAL LOW (ref 21–57)
TIBC: 408 ug/dL (ref 236–444)
UIBC: 390 ug/dL — ABNORMAL HIGH (ref 120–384)

## 2021-01-20 LAB — FERRITIN: Ferritin: 12 ng/mL (ref 11–307)

## 2021-01-22 ENCOUNTER — Ambulatory Visit: Payer: Self-pay | Admitting: Cardiology

## 2021-01-22 ENCOUNTER — Telehealth: Payer: Self-pay | Admitting: Student

## 2021-01-22 ENCOUNTER — Other Ambulatory Visit: Payer: Self-pay

## 2021-01-22 ENCOUNTER — Telehealth: Payer: Self-pay

## 2021-01-22 DIAGNOSIS — Z8249 Family history of ischemic heart disease and other diseases of the circulatory system: Secondary | ICD-10-CM

## 2021-01-22 NOTE — Telephone Encounter (Signed)
Patient called and said she is not able to wear the monitor any more. She only wore it for 3  days and she is having a lot of pain and a wound. She said if she could try and again at a a later date. Please advise

## 2021-01-22 NOTE — Telephone Encounter (Signed)
Patient called on-call service with concerns she has developed a "painful wound" under the Zio patch monitor that was placed 01/20/21. I attempted to call patient back to discuss further, however had to leave a voicemail. I have advised clinic staff to reach to patient and set her up for a office visit to have the "wound" evaluated today.

## 2021-01-22 NOTE — Telephone Encounter (Signed)
Since she is having discomfort with the monitor its okay to stop. Send it in we will  read the data for at least 3 days.  If needed we could reconsider at a later date.

## 2021-01-25 NOTE — Progress Notes (Signed)
Patient came in to have [place] look at her skin from the monitor.

## 2021-02-05 ENCOUNTER — Encounter: Payer: Self-pay | Admitting: Family

## 2021-02-08 ENCOUNTER — Other Ambulatory Visit: Payer: No Typology Code available for payment source

## 2021-02-11 ENCOUNTER — Other Ambulatory Visit: Payer: No Typology Code available for payment source

## 2021-02-26 ENCOUNTER — Ambulatory Visit: Payer: No Typology Code available for payment source | Admitting: Cardiology

## 2021-03-02 ENCOUNTER — Other Ambulatory Visit: Payer: Self-pay

## 2021-03-02 ENCOUNTER — Inpatient Hospital Stay: Payer: No Typology Code available for payment source | Attending: Hematology and Oncology

## 2021-03-02 ENCOUNTER — Inpatient Hospital Stay: Payer: No Typology Code available for payment source

## 2021-03-02 VITALS — BP 134/72 | HR 77 | Temp 99.3°F | Resp 18

## 2021-03-02 DIAGNOSIS — D75838 Other thrombocytosis: Secondary | ICD-10-CM | POA: Insufficient documentation

## 2021-03-02 DIAGNOSIS — D45 Polycythemia vera: Secondary | ICD-10-CM | POA: Diagnosis present

## 2021-03-02 DIAGNOSIS — Z1589 Genetic susceptibility to other disease: Secondary | ICD-10-CM

## 2021-03-02 LAB — CBC WITH DIFFERENTIAL (CANCER CENTER ONLY)
Abs Immature Granulocytes: 0.1 10*3/uL — ABNORMAL HIGH (ref 0.00–0.07)
Basophils Absolute: 0.3 10*3/uL — ABNORMAL HIGH (ref 0.0–0.1)
Basophils Relative: 3 %
Eosinophils Absolute: 0.5 10*3/uL (ref 0.0–0.5)
Eosinophils Relative: 5 %
HCT: 46 % (ref 36.0–46.0)
Hemoglobin: 14.3 g/dL (ref 12.0–15.0)
Immature Granulocytes: 1 %
Lymphocytes Relative: 19 %
Lymphs Abs: 2.1 10*3/uL (ref 0.7–4.0)
MCH: 20.8 pg — ABNORMAL LOW (ref 26.0–34.0)
MCHC: 31.1 g/dL (ref 30.0–36.0)
MCV: 66.8 fL — ABNORMAL LOW (ref 80.0–100.0)
Monocytes Absolute: 1 10*3/uL (ref 0.1–1.0)
Monocytes Relative: 9 %
Neutro Abs: 7 10*3/uL (ref 1.7–7.7)
Neutrophils Relative %: 63 %
Platelet Count: 519 10*3/uL — ABNORMAL HIGH (ref 150–400)
RBC: 6.89 MIL/uL — ABNORMAL HIGH (ref 3.87–5.11)
RDW: 20 % — ABNORMAL HIGH (ref 11.5–15.5)
WBC Count: 10.9 10*3/uL — ABNORMAL HIGH (ref 4.0–10.5)
nRBC: 0 % (ref 0.0–0.2)

## 2021-03-02 LAB — CMP (CANCER CENTER ONLY)
ALT: 11 U/L (ref 0–44)
AST: 14 U/L — ABNORMAL LOW (ref 15–41)
Albumin: 4.3 g/dL (ref 3.5–5.0)
Alkaline Phosphatase: 88 U/L (ref 38–126)
Anion gap: 9 (ref 5–15)
BUN: 17 mg/dL (ref 8–23)
CO2: 28 mmol/L (ref 22–32)
Calcium: 9.5 mg/dL (ref 8.9–10.3)
Chloride: 101 mmol/L (ref 98–111)
Creatinine: 0.79 mg/dL (ref 0.44–1.00)
GFR, Estimated: >60 mL/min (ref >60)
Glucose, Bld: 90 mg/dL (ref 70–99)
Potassium: 3.9 mmol/L (ref 3.5–5.1)
Sodium: 138 mmol/L (ref 135–145)
Total Bilirubin: 0.7 mg/dL (ref 0.3–1.2)
Total Protein: 7.4 g/dL (ref 6.5–8.1)

## 2021-03-02 LAB — LACTATE DEHYDROGENASE: LDH: 193 U/L — ABNORMAL HIGH (ref 98–192)

## 2021-03-02 NOTE — Progress Notes (Signed)
Connie Myers presents today for phlebotomy per MD orders. Phlebotomy procedure started at 1430 and ended at 1441. 525 cc removed via 16 G needle at [facility] site. Patient tolerated procedure well.

## 2021-03-03 LAB — IRON AND TIBC
Iron: 18 ug/dL — ABNORMAL LOW (ref 41–142)
Saturation Ratios: 4 % — ABNORMAL LOW (ref 21–57)
TIBC: 415 ug/dL (ref 236–444)
UIBC: 398 ug/dL — ABNORMAL HIGH (ref 120–384)

## 2021-03-03 LAB — FERRITIN: Ferritin: 8 ng/mL — ABNORMAL LOW (ref 11–307)

## 2021-04-07 ENCOUNTER — Encounter: Payer: Self-pay | Admitting: Hematology & Oncology

## 2021-04-07 ENCOUNTER — Inpatient Hospital Stay: Payer: No Typology Code available for payment source | Attending: Hematology and Oncology

## 2021-04-07 ENCOUNTER — Other Ambulatory Visit: Payer: Self-pay

## 2021-04-07 ENCOUNTER — Inpatient Hospital Stay: Payer: No Typology Code available for payment source

## 2021-04-07 ENCOUNTER — Inpatient Hospital Stay (HOSPITAL_BASED_OUTPATIENT_CLINIC_OR_DEPARTMENT_OTHER): Payer: No Typology Code available for payment source | Admitting: Hematology & Oncology

## 2021-04-07 VITALS — BP 137/64 | HR 81 | Temp 97.9°F | Resp 19 | Wt 239.0 lb

## 2021-04-07 DIAGNOSIS — D75838 Other thrombocytosis: Secondary | ICD-10-CM | POA: Diagnosis not present

## 2021-04-07 DIAGNOSIS — D45 Polycythemia vera: Secondary | ICD-10-CM

## 2021-04-07 DIAGNOSIS — E611 Iron deficiency: Secondary | ICD-10-CM | POA: Diagnosis not present

## 2021-04-07 DIAGNOSIS — Z1589 Genetic susceptibility to other disease: Secondary | ICD-10-CM

## 2021-04-07 LAB — CMP (CANCER CENTER ONLY)
ALT: 9 U/L (ref 0–44)
AST: 11 U/L — ABNORMAL LOW (ref 15–41)
Albumin: 4.3 g/dL (ref 3.5–5.0)
Alkaline Phosphatase: 100 U/L (ref 38–126)
Anion gap: 8 (ref 5–15)
BUN: 14 mg/dL (ref 8–23)
CO2: 29 mmol/L (ref 22–32)
Calcium: 9.7 mg/dL (ref 8.9–10.3)
Chloride: 101 mmol/L (ref 98–111)
Creatinine: 0.67 mg/dL (ref 0.44–1.00)
GFR, Estimated: >60 mL/min (ref >60)
Glucose, Bld: 107 mg/dL — ABNORMAL HIGH (ref 70–99)
Potassium: 3.8 mmol/L (ref 3.5–5.1)
Sodium: 138 mmol/L (ref 135–145)
Total Bilirubin: 0.7 mg/dL (ref 0.3–1.2)
Total Protein: 7.7 g/dL (ref 6.5–8.1)

## 2021-04-07 LAB — CBC WITH DIFFERENTIAL (CANCER CENTER ONLY)
Abs Immature Granulocytes: 0.12 10*3/uL — ABNORMAL HIGH (ref 0.00–0.07)
Basophils Absolute: 0.3 10*3/uL — ABNORMAL HIGH (ref 0.0–0.1)
Basophils Relative: 3 %
Eosinophils Absolute: 0.7 10*3/uL — ABNORMAL HIGH (ref 0.0–0.5)
Eosinophils Relative: 6 %
HCT: 44.9 % (ref 36.0–46.0)
Hemoglobin: 13.9 g/dL (ref 12.0–15.0)
Immature Granulocytes: 1 %
Lymphocytes Relative: 21 %
Lymphs Abs: 2.4 10*3/uL (ref 0.7–4.0)
MCH: 21.1 pg — ABNORMAL LOW (ref 26.0–34.0)
MCHC: 31 g/dL (ref 30.0–36.0)
MCV: 68.1 fL — ABNORMAL LOW (ref 80.0–100.0)
Monocytes Absolute: 1 10*3/uL (ref 0.1–1.0)
Monocytes Relative: 9 %
Neutro Abs: 6.5 10*3/uL (ref 1.7–7.7)
Neutrophils Relative %: 60 %
Platelet Count: 437 10*3/uL — ABNORMAL HIGH (ref 150–400)
RBC: 6.59 MIL/uL — ABNORMAL HIGH (ref 3.87–5.11)
RDW: 20.1 % — ABNORMAL HIGH (ref 11.5–15.5)
WBC Count: 11 10*3/uL — ABNORMAL HIGH (ref 4.0–10.5)
nRBC: 0 % (ref 0.0–0.2)

## 2021-04-07 LAB — LACTATE DEHYDROGENASE: LDH: 174 U/L (ref 98–192)

## 2021-04-07 NOTE — Progress Notes (Signed)
Hematology and Oncology Follow Up Visit  Connie Myers [id number] 11/13/1960 60 y.o. 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 Lorenso Myers. JAK2 V617F mutation returned as positive. 01/16/2020: bone marrow biopsy confirms hypercellular marrow consistent with polycythemia vera.    Current Therapy: Phlebotomy to maintain Hct < 45% per MD 08/26/2020 Hydroxyurea 500 mg PO daily ASA 65m PO daily   Interim History:  Connie Myers here today for follow-up.  We last saw her back in early August.  Since then, she been doing okay.  She is taking the Hydrea daily.  This clearly is showing its effectiveness because her platelet count is trending downward which is nice to see.  She is supposed to see a cardiologist.  I am unsure exactly as to why she is seeing the cardiologist.  I think she may have had some issues with palpitations and high blood pressure.    I think she is going to get a new job.  She wants to be more active.  Last time she had iron studies done in September showed a ferritin of 8 with an iron saturation of 4%.  She has had no fever.  There is been no change in bowel or bladder habits.  She has had no rashes.  She has had no nausea or vomiting.  She has had some chronic abdominal pain because of past hernia surgery.  Overall, her performance status is ECOG 1.  Medications:  Allergies as of 04/07/2021       Reactions   Codeine Nausea Only   Local [lidocaine] Other (See Comments)   Local Anesthesia does not work for her        Medication List        Accurate as of April 07, 2021  1:59 PM. If you have any questions, ask your nurse or doctor.          aspirin EC 81 MG tablet Take 81 mg by mouth daily. Swallow whole.   hydrochlorothiazide 25 MG tablet Commonly known as: HYDRODIURIL Take 1 tablet (25 mg total) by mouth daily.   hydroxyurea 500 MG  capsule Commonly known as: HYDREA Take 1 capsule (500 mg total) by mouth daily. May take with food to minimize GI side effects.   ibuprofen 200 MG tablet Commonly known as: ADVIL Take 600 mg by mouth every 6 (six) hours as needed.   irbesartan 150 MG tablet Commonly known as: AVAPRO Take 1 tablet by mouth daily.        Allergies:  Allergies  Allergen Reactions   Codeine Nausea Only   Local [Lidocaine] Other (See Comments)    Local Anesthesia does not work for her    Past Medical History, Surgical history, Social history, and Family History were reviewed and updated.  Review of Systems: Review of Systems  Constitutional: Negative.   HENT: Negative.    Eyes: Negative.   Respiratory: Negative.    Cardiovascular: Negative.   Gastrointestinal: Negative.   Genitourinary: Negative.   Musculoskeletal: Negative.   Skin: Negative.   Neurological: Negative.   Endo/Heme/Allergies: Negative.   Psychiatric/Behavioral: Negative.      Physical Exam:  vitals were not taken for this visit.   Wt Readings from Last 3 Encounters:  01/19/21 235 lb (106.6 kg)  01/15/21 235 lb (106.6 kg)  08/26/20 238 lb (108 kg)    Her vital signs are temperature of 97.9.  Pulse 81.  Blood pressure  137/64.  Weight is 239 pounds.  Physical Exam Vitals reviewed.  HENT:     Head: Normocephalic and atraumatic.  Eyes:     Pupils: Pupils are equal, round, and reactive to light.  Cardiovascular:     Rate and Rhythm: Normal rate and regular rhythm.     Heart sounds: Normal heart sounds.  Pulmonary:     Effort: Pulmonary effort is normal.     Breath sounds: Normal breath sounds.  Abdominal:     General: Bowel sounds are normal.     Palpations: Abdomen is soft.  Musculoskeletal:        General: No tenderness or deformity. Normal range of motion.     Cervical back: Normal range of motion.  Lymphadenopathy:     Cervical: No cervical adenopathy.  Skin:    General: Skin is warm and dry.      Findings: No erythema or rash.  Neurological:     Mental Status: She is alert and oriented to person, place, and time.  Psychiatric:        Behavior: Behavior normal.        Thought Content: Thought content normal.        Judgment: Judgment normal.    Lab Results  Component Value Date   WBC 11.0 (H) 04/07/2021   HGB 13.9 04/07/2021   HCT 44.9 04/07/2021   MCV 68.1 (L) 04/07/2021   PLT 437 (H) 04/07/2021   Lab Results  Component Value Date   FERRITIN 8 (L) 03/02/2021   IRON 18 (L) 03/02/2021   TIBC 415 03/02/2021   UIBC 398 (H) 03/02/2021   IRONPCTSAT 4 (L) 03/02/2021   Lab Results  Component Value Date   RETICCTPCT 1.4 12/19/2019   RBC 6.59 (H) 04/07/2021   No results found for: KPAFRELGTCHN, LAMBDASER, KAPLAMBRATIO No results found for: IGGSERUM, IGA, IGMSERUM No results found for: Ronnald Ramp, A1GS, A2GS, Tillman Sers, SPEI   Chemistry      Component Value Date/Time   NA 138 03/02/2021 1358   K 3.9 03/02/2021 1358   CL 101 03/02/2021 1358   CO2 28 03/02/2021 1358   BUN 17 03/02/2021 1358   CREATININE 0.79 03/02/2021 1358      Component Value Date/Time   CALCIUM 9.5 03/02/2021 1358   ALKPHOS 88 03/02/2021 1358   AST 14 (L) 03/02/2021 1358   ALT 11 03/02/2021 1358   BILITOT 0.7 03/02/2021 1358       Impression and Plan: Connie Myers is a very pleasant 60 yo caucasian female with Polycythemia Vera, JAK2 positive, as well as reactive thrombocytosis due to iron deficiency.  We will go ahead and do a phlebotomy today.  She is close to 45%.  I think if we do the phlebotomy today, we will be able to get her through the holiday season.  She wanted to know if she would be an organ donor.  Unfortunately, she cannot be an organ donor because she has a bone marrow neoplasm.  I think we will plan to get her back after the holiday season.  We will get her back in January.  By then she would have had a new job.  Connie Napoleon,  MD 10/19/20221:59 PM

## 2021-04-08 LAB — IRON AND TIBC
Iron: 17 ug/dL — ABNORMAL LOW (ref 41–142)
Saturation Ratios: 4 % — ABNORMAL LOW (ref 21–57)
TIBC: 404 ug/dL (ref 236–444)
UIBC: 386 ug/dL — ABNORMAL HIGH (ref 120–384)

## 2021-04-08 LAB — FERRITIN: Ferritin: 8 ng/mL — ABNORMAL LOW (ref 11–307)

## 2021-04-19 NOTE — Progress Notes (Signed)
CARDIOLOGY CONSULT NOTE       Patient ID: Connie Myers MRN: [id number] DOB/AGE: 1961-04-22 60 y.o.  Admit date: (Not on file) Referring Physician: Moreen Myers Primary Physician: Connie Contras, MD Primary Cardiologist: Connie Myers Reason for Consultation: Palpitaitons  Active Problems:   * No active hospital problems. *   HPI:  60 y.o. with history of HTN, P-vera followed by [name] on hydroxyurea and obesity. 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 well " also, no codiene.    GERD (gastroesophageal reflux disease)    Hammertoe of right foot    Headache    Hypertension    Plantar wart, right foot    Polycythemia vera (HCC)    Severe obesity (HCC)    Situational stress    Sleep apnea    PMH: " I don't have it now "   Thrombocytosis    Ventral hernia    Wears glasses     Family History  Problem Relation Age of Onset   Heart attack Mother    Breast cancer Mother    Rashes / Skin problems Father    Lupus Sister     Social History   Socioeconomic History   Marital status: Married    Spouse name: Not on file   Number of children: Not on file   Years of education: Not on file   Highest education level: Not on file  Occupational History   Not on file   Tobacco Use   Smoking status: Never   Smokeless tobacco: Never  Vaping Use   Vaping Use: Never used  Substance and Sexual Activity   Alcohol use: Yes    Comment: occasionally   Drug use: Not Currently   Sexual activity: Not on file  Other Topics Concern   Not on file  Social History Narrative   Not on file   Social Determinants of Health   Financial Resource Strain: Not on file  Food Insecurity: Not on file  Transportation Needs: Not on file  Physical Activity: Not on file  Stress: Not on file  Social Connections: Not on file  Intimate Partner Violence: Not on file    Past Surgical History:  Procedure Laterality Date   [place] N/A 12/05/2017   Procedure: INSERTION OF MESH;  Surgeon: Donnie Mesa, MD;  Location: [place];  Service: General;  Laterality: N/A;   VENTRAL HERNIA REPAIR  12/05/2017   OPEN VENTRAL HERNIA REPAIR ERAS PATHWAY w/mesh   VENTRAL HERNIA REPAIR N/A 12/05/2017  Procedure: OPEN VENTRAL HERNIA REPAIR ERAS PATHWAY;  Surgeon: Donnie Mesa, MD;  Location: [place];  Service: General;  Laterality: N/A;   VENTRAL HERNIA REPAIR  01/01/2019   VENTRAL HERNIA REPAIR N/A 01/01/2019   Procedure: LAPAROSCOPIC VENTRAL HERNIA REPAIR WITH MESH;  Surgeon: Donnie Mesa, MD;  Location: [place];  Service: General;  Laterality: N/A;   WISDOM TOOTH EXTRACTION     WOUND EXPLORATION N/A 12/29/2017   Procedure: ABDOMINAL WOUND EXPLORATION;  Surgeon: Donnie Mesa, MD;  Location: [facility];  Service: General;  Laterality: N/A;  LMA      Current Outpatient Medications:    aspirin EC 81 MG tablet, Take 81 mg by mouth daily. Swallow whole., Disp: , Rfl:    hydrochlorothiazide (HYDRODIURIL) 25 MG tablet, Take 1 tablet (25 mg total) by mouth daily., Disp: 30 tablet, Rfl: 0   hydroxyurea (HYDREA) 500 MG capsule, Take 1 capsule (500 mg total) by mouth daily. May take with food to minimize GI side  effects., Disp: 90 capsule, Rfl: 1   ibuprofen (ADVIL) 200 MG tablet, Take 600 mg by mouth every 6 (six) hours as needed., Disp: , Rfl:    irbesartan (AVAPRO) 150 MG tablet, Take 1 tablet by mouth daily., Disp: , Rfl:     Physical Exam: There were no vitals taken for this visit.   Affect appropriate Obese female  HEENT: normal Neck supple with no adenopathy JVP normal no bruits no thyromegaly Lungs clear with no wheezing and good diaphragmatic motion Heart:  S1/S2 no murmur, no rub, gallop or click PMI normal Abdomen: benighn, BS positve, no tenderness, no AAA no bruit.  No HSM or HJR Distal pulses intact with no bruits No edema Neuro non-focal Skin warm and dry No muscular weakness   Labs:   Lab Results  Component Value Date   WBC 11.0 (H) 04/07/2021   HGB 13.9 04/07/2021   HCT 44.9 04/07/2021   MCV 68.1 (L) 04/07/2021   PLT 437 (H) 04/07/2021   No results for input(s): NA, K, CL, CO2, BUN, CREATININE, CALCIUM, PROT, BILITOT, ALKPHOS, ALT, AST, GLUCOSE in the last 168 hours.  Invalid input(s): LABALBU Lab Results  Component Value Date   TROPONINI <0.03 06/08/2018     Radiology: No results found.  EKG: SR rate 74 LAE otherwise normal 01/15/21   ASSESSMENT AND PLAN:   Palpitations:  benign sounding ? Related to obesity and stress Check TSH/T4, Hct ok, 14 day event monitor since she complains of nightly symptoms and TTE to r/o structural heart dx P-vera:  f/u Ennever continue hydroyurea PLT still elevated but improved  HTN:  Has been on Avapro 150 mg and Hydrodiuril 25 mg stable discussed taking ARB in am and diuretic latter in day   TTE TSH/T4 14 day monitor    F/U cardiology PRN   Signed: Jenkins Rouge 04/19/2021, 6:03 PM

## 2021-04-27 ENCOUNTER — Ambulatory Visit (INDEPENDENT_AMBULATORY_CARE_PROVIDER_SITE_OTHER): Payer: No Typology Code available for payment source | Admitting: Cardiovascular Disease

## 2021-04-27 ENCOUNTER — Ambulatory Visit (INDEPENDENT_AMBULATORY_CARE_PROVIDER_SITE_OTHER): Payer: No Typology Code available for payment source

## 2021-04-27 ENCOUNTER — Ambulatory Visit: Payer: No Typology Code available for payment source

## 2021-04-27 ENCOUNTER — Encounter: Payer: Self-pay | Admitting: Cardiovascular Disease

## 2021-04-27 ENCOUNTER — Other Ambulatory Visit: Payer: Self-pay

## 2021-04-27 VITALS — BP 122/80 | HR 87 | Ht 69.0 in | Wt 237.0 lb

## 2021-04-27 DIAGNOSIS — Z862 Personal history of diseases of the blood and blood-forming organs and certain disorders involving the immune mechanism: Secondary | ICD-10-CM | POA: Diagnosis not present

## 2021-04-27 DIAGNOSIS — I10 Essential (primary) hypertension: Secondary | ICD-10-CM

## 2021-04-27 DIAGNOSIS — R002 Palpitations: Secondary | ICD-10-CM | POA: Diagnosis not present

## 2021-04-27 LAB — TSH: TSH: 1.63 u[IU]/mL (ref 0.450–4.500)

## 2021-04-27 LAB — T4, FREE: Free T4: 1.27 ng/dL (ref 0.82–1.77)

## 2021-04-27 NOTE — Patient Instructions (Signed)
Medication Instructions:  Your physician recommends that you continue on your current medications as directed. Please refer to the Current Medication list given to you today.  *If you need a refill on your cardiac medications before your next appointment, please call your pharmacy*  Lab Work: Your physician recommends that you have lab work today- TSH and T4  If you have labs (blood work) drawn today and your tests are completely normal, you will receive your results only by: [place] (if you have MyChart) OR A paper copy in the mail If you have any lab test that is abnormal or we need to change your treatment, we will call you to review the results.   Testing/Procedures: Your physician has requested that you have an echocardiogram. Echocardiography is a painless test that uses sound waves to create images of your heart. It provides your doctor with information about the size and shape of your heart and how well your heart's chambers and valves are working. This procedure takes approximately one hour. There are no restrictions for this procedure.  ZIO XT- Long Term Monitor Instructions  Your physician has requested you wear a ZIO patch monitor for 14 days.  This is a single patch monitor. [employer] supplies one patch monitor per enrollment. Additional stickers are not available. Please do not apply patch if you will be having a Nuclear Stress Test,  Echocardiogram, Cardiac CT, MRI, or Chest Xray during the period you would be wearing the  monitor. The patch cannot be worn during these tests. You cannot remove and re-apply the  ZIO XT patch monitor.  Your ZIO patch monitor will be mailed 3 day USPS to your address on file. It may take 3-5 days  to receive your monitor after you have been enrolled.  Once you have received your monitor, please review the enclosed instructions. Your monitor  has already been registered assigning a specific monitor serial # to you.  Billing and Patient  Assistance Program Information  We have supplied [employer] with any of your insurance information on file for billing purposes. [employer] offers a sliding scale Patient Assistance Program for patients that do not have  insurance, or whose insurance does not completely cover the cost of the ZIO monitor.  You must apply for the Patient Assistance Program to qualify for this discounted rate.  To apply, please call [employer] at [phone], select option 4, select option 2, ask to apply for  Patient Assistance Program. Theodore Demark will ask your household income, and how many people  are in your household. They will quote your out-of-pocket cost based on that information.  [employer] will also be able to set up a 61-month, interest-free payment plan if needed.  Applying the monitor   Shave hair from upper left chest.  Hold abrader disc by orange tab. Rub abrader in 40 strokes over the upper left chest as  indicated in your monitor instructions.  Clean area with 4 enclosed alcohol pads. Let dry.  Apply patch as indicated in monitor instructions. Patch will be placed under collarbone on left  side of chest with arrow pointing upward.  Rub patch adhesive wings for 2 minutes. Remove white label marked "1". Remove the white  label marked "2". Rub patch adhesive wings for 2 additional minutes.  While looking in a mirror, press and release button in center of patch. A small green light will  flash 3-4 times. This will be your only indicator that the monitor has been turned on.  Do not shower  for the first 24 hours. You may shower after the first 24 hours.  Press the button if you feel a symptom. You will hear a small click. Record Date, Time and  Symptom in the Patient Logbook.  When you are ready to remove the patch, follow instructions on the last 2 pages of Patient  Logbook. Stick patch monitor onto the last page of Patient Logbook.  Place Patient Logbook in the blue and white box. Use locking tab on box and  tape box closed  securely. The blue and white box has prepaid postage on it. Please place it in the mailbox as  soon as possible. Your physician should have your test results approximately 7 days after the  monitor has been mailed back to [facility].  Call [facility] at [phone] if you have questions regarding  your ZIO XT patch monitor. Call them immediately if you see an orange light blinking on your  monitor.  If your monitor falls off in less than 4 days, contact our Monitor department at [phone].  If your monitor becomes loose or falls off after 4 days call [employer] at [phone] for  suggestions on securing your monitor   Follow-Up: At [facility], you and your health needs are our priority.  As part of our continuing mission to provide you with exceptional heart care, we have created designated Provider Care Teams.  These Care Teams include your primary Cardiologist (physician) and Advanced Practice Providers (APPs -  Physician Assistants and Nurse Practitioners) who all work together to provide you with the care you need, when you need it.  We recommend signing up for the patient portal called "MyChart".  Sign up information is provided on this After Visit Summary.  MyChart is used to connect with patients for Virtual Visits (Telemedicine).  Patients are able to view lab/test results, encounter notes, upcoming appointments, etc.  Non-urgent messages can be sent to your provider as well.   To learn more about what you can do with MyChart, go to [email or web address].    Your next appointment:   As needed  The format for your next appointment:   In Person  Provider:   Jenkins Rouge, MD

## 2021-04-27 NOTE — Progress Notes (Unsigned)
14 day ZIO XT serial # [id number] from office inventory applied in office.

## 2021-04-27 NOTE — Progress Notes (Unsigned)
14 day [employer] long term holter monitor from office inventory serial# [id number] applied in office.

## 2021-05-19 ENCOUNTER — Other Ambulatory Visit (HOSPITAL_COMMUNITY): Payer: No Typology Code available for payment source

## 2021-05-19 ENCOUNTER — Telehealth (HOSPITAL_COMMUNITY): Payer: Self-pay | Admitting: Cardiovascular Disease

## 2021-05-19 NOTE — Telephone Encounter (Signed)
Patient called and cancelled echo due to she had to work. Patient will call back to reschedule. Order will be removed from the echo WQ and when she calls back we will reinstate the order.

## 2021-06-06 ENCOUNTER — Other Ambulatory Visit: Payer: Self-pay | Admitting: Hematology & Oncology

## 2021-06-07 ENCOUNTER — Encounter: Payer: Self-pay | Admitting: Hematology and Oncology

## 2021-06-10 ENCOUNTER — Encounter: Payer: Self-pay | Admitting: Hematology & Oncology

## 2021-06-25 ENCOUNTER — Ambulatory Visit: Payer: No Typology Code available for payment source | Admitting: Hematology & Oncology

## 2021-06-25 ENCOUNTER — Other Ambulatory Visit: Payer: No Typology Code available for payment source

## 2021-06-29 ENCOUNTER — Inpatient Hospital Stay: Payer: No Typology Code available for payment source

## 2021-06-29 ENCOUNTER — Other Ambulatory Visit: Payer: Self-pay

## 2021-06-29 ENCOUNTER — Inpatient Hospital Stay: Payer: No Typology Code available for payment source | Attending: Hematology and Oncology

## 2021-06-29 ENCOUNTER — Encounter: Payer: Self-pay | Admitting: Hematology & Oncology

## 2021-06-29 ENCOUNTER — Inpatient Hospital Stay (HOSPITAL_BASED_OUTPATIENT_CLINIC_OR_DEPARTMENT_OTHER): Payer: No Typology Code available for payment source | Admitting: Hematology & Oncology

## 2021-06-29 VITALS — BP 147/94 | HR 61 | Temp 97.8°F | Resp 18 | Wt 237.8 lb

## 2021-06-29 DIAGNOSIS — Z79899 Other long term (current) drug therapy: Secondary | ICD-10-CM | POA: Diagnosis not present

## 2021-06-29 DIAGNOSIS — D45 Polycythemia vera: Secondary | ICD-10-CM

## 2021-06-29 DIAGNOSIS — D75838 Other thrombocytosis: Secondary | ICD-10-CM | POA: Diagnosis not present

## 2021-06-29 LAB — CBC WITH DIFFERENTIAL (CANCER CENTER ONLY)
Abs Immature Granulocytes: 0.13 10*3/uL — ABNORMAL HIGH (ref 0.00–0.07)
Basophils Absolute: 0.3 10*3/uL — ABNORMAL HIGH (ref 0.0–0.1)
Basophils Relative: 3 %
Eosinophils Absolute: 0.5 10*3/uL (ref 0.0–0.5)
Eosinophils Relative: 6 %
HCT: 43.6 % (ref 36.0–46.0)
Hemoglobin: 13.3 g/dL (ref 12.0–15.0)
Immature Granulocytes: 2 %
Lymphocytes Relative: 19 %
Lymphs Abs: 1.6 10*3/uL (ref 0.7–4.0)
MCH: 21.8 pg — ABNORMAL LOW (ref 26.0–34.0)
MCHC: 30.5 g/dL (ref 30.0–36.0)
MCV: 71.4 fL — ABNORMAL LOW (ref 80.0–100.0)
Monocytes Absolute: 0.8 10*3/uL (ref 0.1–1.0)
Monocytes Relative: 10 %
Neutro Abs: 5.1 10*3/uL (ref 1.7–7.7)
Neutrophils Relative %: 60 %
Platelet Count: 502 10*3/uL — ABNORMAL HIGH (ref 150–400)
RBC: 6.11 MIL/uL — ABNORMAL HIGH (ref 3.87–5.11)
RDW: 18.8 % — ABNORMAL HIGH (ref 11.5–15.5)
WBC Count: 8.5 10*3/uL (ref 4.0–10.5)
nRBC: 0 % (ref 0.0–0.2)

## 2021-06-29 LAB — CMP (CANCER CENTER ONLY)
ALT: 10 U/L (ref 0–44)
AST: 11 U/L — ABNORMAL LOW (ref 15–41)
Albumin: 4.4 g/dL (ref 3.5–5.0)
Alkaline Phosphatase: 99 U/L (ref 38–126)
Anion gap: 6 (ref 5–15)
BUN: 18 mg/dL (ref 8–23)
CO2: 30 mmol/L (ref 22–32)
Calcium: 9.5 mg/dL (ref 8.9–10.3)
Chloride: 103 mmol/L (ref 98–111)
Creatinine: 0.67 mg/dL (ref 0.44–1.00)
GFR, Estimated: >60 mL/min (ref >60)
Glucose, Bld: 100 mg/dL — ABNORMAL HIGH (ref 70–99)
Potassium: 4 mmol/L (ref 3.5–5.1)
Sodium: 139 mmol/L (ref 135–145)
Total Bilirubin: 0.7 mg/dL (ref 0.3–1.2)
Total Protein: 7.2 g/dL (ref 6.5–8.1)

## 2021-06-29 LAB — IRON AND IRON BINDING CAPACITY (CC-WL,HP ONLY)
Iron: 21 ug/dL — ABNORMAL LOW (ref 28–170)
Saturation Ratios: 5 % — ABNORMAL LOW (ref 10.4–31.8)
TIBC: 458 ug/dL — ABNORMAL HIGH (ref 250–450)
UIBC: 437 ug/dL (ref 148–442)

## 2021-06-29 LAB — LACTATE DEHYDROGENASE: LDH: 170 U/L (ref 98–192)

## 2021-06-29 LAB — RETICULOCYTES
Immature Retic Fract: 8.8 % (ref 2.3–15.9)
RBC.: 6.03 MIL/uL — ABNORMAL HIGH (ref 3.87–5.11)
Retic Count, Absolute: 71.8 10*3/uL (ref 19.0–186.0)
Retic Ct Pct: 1.2 % (ref 0.4–3.1)

## 2021-06-29 LAB — SAVE SMEAR(SSMR), FOR PROVIDER SLIDE REVIEW

## 2021-06-29 MED ORDER — HYDROXYUREA 500 MG PO CAPS: 500.0000 mg | ORAL_CAPSULE | Freq: Every day | ORAL | 6 refills | Status: DC

## 2021-06-29 NOTE — Progress Notes (Signed)
Hematology and Oncology Follow Up Visit  Connie Myers [id number] 1960-08-03 61 y.o. 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 Lorenso Courier. JAK2 V617F mutation returned as positive. 01/16/2020: bone marrow biopsy confirms hypercellular marrow consistent with polycythemia vera.    Current Therapy: Phlebotomy to maintain Hct < 45% per MD 08/26/2020 Hydroxyurea 500 mg PO daily ASA 13m PO daily   Interim History:  Connie Myers here today for follow-up.  So far, she is doing pretty well.  She we last saw her back in October.  She thankfully, does not need to be phlebotomized right now.  When we last saw her, her ferritin was 8 with an iron saturation of 4%.  She is still working.  She has a new job.  She is on her feet quite a lot.  She is taking the Hydrea daily.  She was taking it every other day and her platelet count went up a little bit.  She has had no problems with cough or shortness of breath.  She has had no issues with nausea or vomiting.  She has had no problems with bleeding.  There is been no leg swelling.  Overall, I would say performance status is probably ECOG 1.     Medications:  Allergies as of 06/29/2021       Reactions   Codeine Nausea Only   Local [lidocaine] Other (See Comments)   Local Anesthesia does not work for her        Medication List        Accurate as of June 29, 2021 12:13 PM. If you have any questions, ask your nurse or doctor.          aspirin EC 81 MG tablet Take 81 mg by mouth daily. Swallow whole.   hydrochlorothiazide 25 MG tablet Commonly known as: HYDRODIURIL Take 1 tablet (25 mg total) by mouth daily.   hydroxyurea 500 MG capsule Commonly known as: HYDREA Take 1 capsule (500 mg total) by mouth daily. May take with food to minimize GI side effects. What changed: See the new instructions. Changed by: PVolanda Napoleon  MD   ibuprofen 200 MG tablet Commonly known as: ADVIL Take 600 mg by mouth every 6 (six) hours as needed.   irbesartan 150 MG tablet Commonly known as: AVAPRO Take 1 tablet by mouth daily.   meloxicam 15 MG tablet Commonly known as: MOBIC Take 15 mg by mouth daily as needed.        Allergies:  Allergies  Allergen Reactions   Codeine Nausea Only   Local [Lidocaine] Other (See Comments)    Local Anesthesia does not work for her    Past Medical History, Surgical history, Social history, and Family History were reviewed and updated.  Review of Systems: Review of Systems  Constitutional: Negative.   HENT: Negative.    Eyes: Negative.   Respiratory: Negative.    Cardiovascular: Negative.   Gastrointestinal: Negative.   Genitourinary: Negative.   Musculoskeletal: Negative.   Skin: Negative.   Neurological: Negative.   Endo/Heme/Allergies: Negative.   Psychiatric/Behavioral: Negative.      Physical Exam:  weight is 237 lb 12 oz (107.8 kg). Her oral temperature is 97.8 F (36.6 C). Her blood pressure is 147/94 (abnormal) and her pulse is 61. Her respiration is 18 and oxygen saturation is 99%.   Wt Readings from Last 3 Encounters:  06/29/21 237 lb 12 oz (107.8 kg)  04/27/21 237 lb (107.5 kg)  04/07/21 239 lb (108.4 kg)    Her vital signs are temperature of 97.9.  Pulse 81.  Blood pressure 137/64.  Weight is 239 pounds.  Physical Exam Vitals reviewed.  HENT:     Head: Normocephalic and atraumatic.  Eyes:     Pupils: Pupils are equal, round, and reactive to light.  Cardiovascular:     Rate and Rhythm: Normal rate and regular rhythm.     Heart sounds: Normal heart sounds.  Pulmonary:     Effort: Pulmonary effort is normal.     Breath sounds: Normal breath sounds.  Abdominal:     General: Bowel sounds are normal.     Palpations: Abdomen is soft.  Musculoskeletal:        General: No tenderness or deformity. Normal range of motion.     Cervical back: Normal  range of motion.  Lymphadenopathy:     Cervical: No cervical adenopathy.  Skin:    General: Skin is warm and dry.     Findings: No erythema or rash.  Neurological:     Mental Status: She is alert and oriented to person, place, and time.  Psychiatric:        Behavior: Behavior normal.        Thought Content: Thought content normal.        Judgment: Judgment normal.    Lab Results  Component Value Date   WBC 8.5 06/29/2021   HGB 13.3 06/29/2021   HCT 43.6 06/29/2021   MCV 71.4 (L) 06/29/2021   PLT 502 (H) 06/29/2021   Lab Results  Component Value Date   FERRITIN 8 (L) 04/07/2021   IRON 17 (L) 04/07/2021   TIBC 404 04/07/2021   UIBC 386 (H) 04/07/2021   IRONPCTSAT 4 (L) 04/07/2021   Lab Results  Component Value Date   RETICCTPCT 1.2 06/29/2021   RBC 6.03 (H) 06/29/2021   RBC 6.11 (H) 06/29/2021   No results found for: KPAFRELGTCHN, LAMBDASER, KAPLAMBRATIO No results found for: IGGSERUM, IGA, IGMSERUM No results found for: Odetta Pink, SPEI   Chemistry      Component Value Date/Time   NA 139 06/29/2021 1036   K 4.0 06/29/2021 1036   CL 103 06/29/2021 1036   CO2 30 06/29/2021 1036   BUN 18 06/29/2021 1036   CREATININE 0.67 06/29/2021 1036      Component Value Date/Time   CALCIUM 9.5 06/29/2021 1036   ALKPHOS 99 06/29/2021 1036   AST 11 (L) 06/29/2021 1036   ALT 10 06/29/2021 1036   BILITOT 0.7 06/29/2021 1036       Impression and Plan: Connie Myers is a very pleasant 61 yo caucasian female with Polycythemia Vera, JAK2 positive, as well as reactive thrombocytosis due to iron deficiency.  Thankfully, she does not need a phlebotomy today.  We will continue her on the Hydrea at 500 mg a day.  Hopefully, her platelet count will trend downward.  If not, we can always increase the Hydrea dose.  We will plan to get her back to see [place] in 2 months now.  I think this would be reasonable and try to get her through  Winter.    Volanda Napoleon, MD 1/10/202312:13 PM

## 2021-06-30 LAB — FERRITIN: Ferritin: 8 ng/mL — ABNORMAL LOW (ref 11–307)

## 2021-07-16 ENCOUNTER — Telehealth: Payer: Self-pay | Admitting: *Deleted

## 2021-07-16 NOTE — Telephone Encounter (Signed)
Call received from patient stating that her PCP, Dr. [name] would like to know what kind of weight loss medication she can take along with the Hydroxyurea.  Dr. [name] notified.  Call placed back to patient and patient notified that she can take any kind of weight loss medication that her PCP prescribes along with Hydroxyurea.  Pt is appreciative of call and information and requests that most recent labs be faxed to Dr. [name].  Labs from 06/29/21 faxed to Dr. [name] per pt.'s request.

## 2021-08-25 ENCOUNTER — Ambulatory Visit: Payer: No Typology Code available for payment source | Admitting: Hematology & Oncology

## 2021-08-25 ENCOUNTER — Inpatient Hospital Stay: Payer: No Typology Code available for payment source

## 2021-09-08 ENCOUNTER — Inpatient Hospital Stay: Payer: No Typology Code available for payment source

## 2021-09-08 ENCOUNTER — Ambulatory Visit: Payer: No Typology Code available for payment source | Admitting: Hematology & Oncology

## 2021-09-22 ENCOUNTER — Inpatient Hospital Stay: Payer: No Typology Code available for payment source | Attending: Hematology and Oncology

## 2021-09-22 ENCOUNTER — Inpatient Hospital Stay: Payer: No Typology Code available for payment source

## 2021-09-22 ENCOUNTER — Telehealth: Payer: Self-pay | Admitting: *Deleted

## 2021-09-22 ENCOUNTER — Encounter: Payer: Self-pay | Admitting: Family

## 2021-09-22 ENCOUNTER — Inpatient Hospital Stay (HOSPITAL_BASED_OUTPATIENT_CLINIC_OR_DEPARTMENT_OTHER): Payer: No Typology Code available for payment source | Admitting: Family

## 2021-09-22 VITALS — BP 138/90 | HR 70 | Temp 98.0°F | Resp 18 | Wt 230.1 lb

## 2021-09-22 DIAGNOSIS — Z1589 Genetic susceptibility to other disease: Secondary | ICD-10-CM

## 2021-09-22 DIAGNOSIS — D75838 Other thrombocytosis: Secondary | ICD-10-CM | POA: Insufficient documentation

## 2021-09-22 DIAGNOSIS — D45 Polycythemia vera: Secondary | ICD-10-CM | POA: Insufficient documentation

## 2021-09-22 DIAGNOSIS — R5383 Other fatigue: Secondary | ICD-10-CM | POA: Diagnosis not present

## 2021-09-22 DIAGNOSIS — Z79899 Other long term (current) drug therapy: Secondary | ICD-10-CM | POA: Diagnosis not present

## 2021-09-22 DIAGNOSIS — D500 Iron deficiency anemia secondary to blood loss (chronic): Secondary | ICD-10-CM

## 2021-09-22 LAB — CMP (CANCER CENTER ONLY)
ALT: 9 U/L (ref 0–44)
AST: 11 U/L — ABNORMAL LOW (ref 15–41)
Albumin: 4.3 g/dL (ref 3.5–5.0)
Alkaline Phosphatase: 94 U/L (ref 38–126)
Anion gap: 7 (ref 5–15)
BUN: 19 mg/dL (ref 8–23)
CO2: 27 mmol/L (ref 22–32)
Calcium: 9.3 mg/dL (ref 8.9–10.3)
Chloride: 102 mmol/L (ref 98–111)
Creatinine: 0.71 mg/dL (ref 0.44–1.00)
GFR, Estimated: >60 mL/min (ref >60)
Glucose, Bld: 114 mg/dL — ABNORMAL HIGH (ref 70–99)
Potassium: 3.6 mmol/L (ref 3.5–5.1)
Sodium: 136 mmol/L (ref 135–145)
Total Bilirubin: 0.8 mg/dL (ref 0.3–1.2)
Total Protein: 7.3 g/dL (ref 6.5–8.1)

## 2021-09-22 LAB — CBC WITH DIFFERENTIAL (CANCER CENTER ONLY)
Abs Immature Granulocytes: 0.1 10*3/uL — ABNORMAL HIGH (ref 0.00–0.07)
Basophils Absolute: 0.2 10*3/uL — ABNORMAL HIGH (ref 0.0–0.1)
Basophils Relative: 2 %
Eosinophils Absolute: 0.5 10*3/uL (ref 0.0–0.5)
Eosinophils Relative: 6 %
HCT: 44.1 % (ref 36.0–46.0)
Hemoglobin: 13.7 g/dL (ref 12.0–15.0)
Immature Granulocytes: 1 %
Lymphocytes Relative: 23 %
Lymphs Abs: 1.9 10*3/uL (ref 0.7–4.0)
MCH: 22 pg — ABNORMAL LOW (ref 26.0–34.0)
MCHC: 31.1 g/dL (ref 30.0–36.0)
MCV: 70.9 fL — ABNORMAL LOW (ref 80.0–100.0)
Monocytes Absolute: 0.9 10*3/uL (ref 0.1–1.0)
Monocytes Relative: 11 %
Neutro Abs: 4.8 10*3/uL (ref 1.7–7.7)
Neutrophils Relative %: 57 %
Platelet Count: 402 10*3/uL — ABNORMAL HIGH (ref 150–400)
RBC: 6.22 MIL/uL — ABNORMAL HIGH (ref 3.87–5.11)
RDW: 17.7 % — ABNORMAL HIGH (ref 11.5–15.5)
WBC Count: 8.5 10*3/uL (ref 4.0–10.5)
nRBC: 0 % (ref 0.0–0.2)

## 2021-09-22 LAB — LACTATE DEHYDROGENASE: LDH: 160 U/L (ref 98–192)

## 2021-09-22 NOTE — Telephone Encounter (Signed)
Per 09/22/21 los - gave upcoming appointment - confirmed ?

## 2021-09-22 NOTE — Progress Notes (Signed)
?Hematology and Oncology Follow Up Visit ? ?Connie Myers ?[id number] ?[birth year] 61 y.o. ?09/22/2021 ? ? 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 Lorenso Courier. JAK2 V617F mutation returned as positive. ?01/16/2020: bone marrow biopsy confirms hypercellular marrow consistent with polycythemia vera.  ?  ?Current Therapy: ?Phlebotomy to maintain Hct < 45% per MD 08/26/2020 ?Hydroxyurea 500 mg PO daily ?ASA 58m PO daily ?  ?Interim History:  [name] here today for follow-up. She notes fatigue at times. She states that she struggles to work 5 days in a row and has spoken to HR about work place accommodations.  ?Hct today is stable at 44%.  ?No fever, chills, n/v, cough, rash, dizziness, SOB, chest pain, palpitations, abdominal pain or changes in bowel or bladder habits.  ?No blood loss noted. No abnormal bruising or petechiae.  ?No swelling, numbness or tingling in her extremities at this time.  ?No falls or syncope.  ?She has maintained a healthy appetite and is considering going to [facility] weight management clinic to help her with her diet plan. She is hydrating well.  ? ?ECOG Performance Status: 1 - Symptomatic but completely ambulatory ? ?Medications:  ?Allergies as of 09/22/2021   ? ?   Reactions  ? Codeine Nausea Only  ? Local [lidocaine] Other (See Comments)  ? Local Anesthesia does not work for her  ? ?  ? ?  ?Medication List  ?  ? ?  ? Accurate as of September 22, 2021 12:10 PM. If you have any questions, ask your nurse or doctor.  ?  ?  ? ?  ? ?aspirin EC 81 MG tablet ?Take 81 mg by mouth daily. Swallow whole. ?  ?hydrochlorothiazide 25 MG tablet ?Commonly known as: HYDRODIURIL ?Take 1 tablet (25 mg total) by mouth daily. ?  ?hydroxyurea 500 MG capsule ?Commonly known as: HYDREA ?Take 1 capsule (500 mg total) by mouth daily. May take with food to minimize GI side effects. ?  ?ibuprofen 200 MG tablet ?Commonly known as:  ADVIL ?Take 600 mg by mouth every 6 (six) hours as needed. ?  ?irbesartan 150 MG tablet ?Commonly known as: AVAPRO ?Take 1 tablet by mouth daily. ?  ?meloxicam 15 MG tablet ?Commonly known as: MOBIC ?Take 15 mg by mouth daily as needed. ?  ? ?  ? ? ?Allergies:  ?Allergies  ?Allergen Reactions  ? Codeine Nausea Only  ? Local [Lidocaine] Other (See Comments)  ?  Local Anesthesia does not work for her  ? ? ?Past Medical History, Surgical history, Social history, and Family History were reviewed and updated. ? ?Review of Systems: ?All other 10 point review of systems is negative.  ? ?Physical Exam: ? vitals were not taken for this visit.  ? ?Wt Readings from Last 3 Encounters:  ?06/29/21 237 lb 12 oz (107.8 kg)  ?04/27/21 237 lb (107.5 kg)  ?04/07/21 239 lb (108.4 kg)  ? ? ?Ocular: Sclerae unicteric, pupils equal, round and reactive to light ?Ear-nose-throat: Oropharynx clear, dentition fair ?Lymphatic: No cervical or supraclavicular adenopathy ?Lungs no rales or rhonchi, good excursion bilaterally ?Heart regular rate and rhythm, no murmur appreciated ?Abd soft, nontender, positive bowel sounds ?MSK no focal spinal tenderness, no joint edema ?Neuro: non-focal, well-oriented, appropriate affect ?Breasts: Deferred  ? ?Lab Results  ?Component Value Date  ? WBC 8.5 09/22/2021  ? HGB 13.7 09/22/2021  ? HCT 44.1 09/22/2021  ? MCV 70.9 (L) 09/22/2021  ? PLT 402 (H)  09/22/2021  ? ?Lab Results  ?Component Value Date  ? FERRITIN 8 (L) 06/29/2021  ? IRON 21 (L) 06/29/2021  ? TIBC 458 (H) 06/29/2021  ? UIBC 437 06/29/2021  ? IRONPCTSAT 5 (L) 06/29/2021  ? ?Lab Results  ?Component Value Date  ? RETICCTPCT 1.2 06/29/2021  ? RBC 6.22 (H) 09/22/2021  ? ?No results found for: KPAFRELGTCHN, LAMBDASER, KAPLAMBRATIO ?No results found for: IGGSERUM, IGA, IGMSERUM ?No results found for: TOTALPROTELP, ALBUMINELP, A1GS, A2GS, BETS, BETA2SER, GAMS, MSPIKE, SPEI ?  Chemistry   ?   ?Component Value Date/Time  ? NA 139 06/29/2021 1036  ? K 4.0  06/29/2021 1036  ? CL 103 06/29/2021 1036  ? CO2 30 06/29/2021 1036  ? BUN 18 06/29/2021 1036  ? CREATININE 0.67 06/29/2021 1036  ?    ?Component Value Date/Time  ? CALCIUM 9.5 06/29/2021 1036  ? ALKPHOS 99 06/29/2021 1036  ? AST 11 (L) 06/29/2021 1036  ? ALT 10 06/29/2021 1036  ? BILITOT 0.7 06/29/2021 1036  ?  ? ? ? ?Impression and Plan: Connie Myers is a very pleasant 61 yo caucasian female with polycythemia vera, JAK 2 positive.  ?No phlebotomy needed this visit, Hct 44%.  ?She will continue her same regimen with Hydrea.  ?Follow-up in 2 months.  ? ?Lottie Dawson, NP ?4/5/202312:10 PM ? ?

## 2021-09-23 LAB — FERRITIN: Ferritin: 11 ng/mL (ref 11–307)

## 2021-09-23 LAB — IRON AND IRON BINDING CAPACITY (CC-WL,HP ONLY)
Iron: 23 ug/dL — ABNORMAL LOW (ref 28–170)
Saturation Ratios: 5 % — ABNORMAL LOW (ref 10.4–31.8)
TIBC: 444 ug/dL (ref 250–450)
UIBC: 421 ug/dL (ref 148–442)

## 2021-10-18 ENCOUNTER — Telehealth: Payer: Self-pay | Admitting: *Deleted

## 2021-10-18 NOTE — Telephone Encounter (Signed)
Message received from patient stating that she is having "constant pain all over" and would like to know if she can do a virtual appt with Dr. [name] this Wednesday.  Dr. [name] notified.  Call placed back to patient and message left to notify pt per order of Dr. [name] to contact her PCP regarding her pain. Instructed pt to call this office back with any further questions or concerns.  ? ? ?

## 2021-10-23 ENCOUNTER — Emergency Department (HOSPITAL_COMMUNITY)
Admission: EM | Admit: 2021-10-23 | Discharge: 2021-10-23 | Disposition: A | Discharge status: Home / Self Care | Payer: No Typology Code available for payment source | Attending: Emergency Medicine | Admitting: Emergency Medicine

## 2021-10-23 ENCOUNTER — Encounter: Payer: Self-pay | Admitting: Hematology and Oncology

## 2021-10-23 ENCOUNTER — Emergency Department (HOSPITAL_COMMUNITY): Payer: No Typology Code available for payment source

## 2021-10-23 ENCOUNTER — Encounter (HOSPITAL_COMMUNITY): Payer: Self-pay | Admitting: *Deleted

## 2021-10-23 ENCOUNTER — Other Ambulatory Visit: Payer: Self-pay

## 2021-10-23 DIAGNOSIS — S8001XA Contusion of right knee, initial encounter: Secondary | ICD-10-CM | POA: Insufficient documentation

## 2021-10-23 DIAGNOSIS — S8002XA Contusion of left knee, initial encounter: Secondary | ICD-10-CM | POA: Diagnosis not present

## 2021-10-23 DIAGNOSIS — S60211A Contusion of right wrist, initial encounter: Secondary | ICD-10-CM | POA: Diagnosis not present

## 2021-10-23 DIAGNOSIS — W19XXXA Unspecified fall, initial encounter: Secondary | ICD-10-CM

## 2021-10-23 DIAGNOSIS — Y990 Civilian activity done for income or pay: Secondary | ICD-10-CM | POA: Insufficient documentation

## 2021-10-23 DIAGNOSIS — W010XXA Fall on same level from slipping, tripping and stumbling without subsequent striking against object, initial encounter: Secondary | ICD-10-CM | POA: Diagnosis not present

## 2021-10-23 DIAGNOSIS — S8991XA Unspecified injury of right lower leg, initial encounter: Secondary | ICD-10-CM | POA: Diagnosis present

## 2021-10-23 MED ORDER — ACETAMINOPHEN 500 MG PO TABS
1000.0000 mg | ORAL_TABLET | Freq: Once | ORAL | Status: AC
Start: 2021-10-23 — End: 2021-10-23
  Administered 2021-10-23: 1000 mg via ORAL
  Filled 2021-10-23: qty 2

## 2021-10-23 NOTE — ED Provider Triage Note (Signed)
Emergency Medicine Provider Triage Evaluation Note ? ?Connie Myers , a 61 y.o. female  was evaluated in triage.  Pt complains of fall from standing.  Patient was at work yesterday.  She tripped over a pallet.  She landed on both knees and her right wrist. ? ?Injury occurred at work.  She reports that [employer] is involved.  She is requesting x-rays of both knees and her right wrist. ? ?She denies other injury.  She specifically denies head injury, loss of conscious, neck pain. ? ?Review of Systems  ?Positive: Bilateral knee and right wrist pain ?Negative: No other complaint ? ?Physical Exam  ?BP (!) 159/98 (BP Location: Left Arm)   Pulse 84   Temp 98 ?F (36.7 ?C) (Oral)   Resp 18   Ht '5\' 9"'$  (1.753 m)   Wt 108.9 kg   SpO2 98%   BMI 35.44 kg/m?  ?Gen:   Awake, no distress   ?Resp:  Normal effort  ?MSK:   Moves extremities without difficulty mild tenderness to the anterior aspects of both knees.  Mild tenderness with palpation overlying the palmar aspect of the right wrist.  Full active range of motion of all affected joints.  Normal gait. ?Other:  AOx4, neurologically intact ? ?Medical Decision Making  ?Medically screening exam initiated at 8:34 AM.  Appropriate orders placed.  Connie Myers was informed that the remainder of the evaluation will be completed by another provider, this initial triage assessment does not replace that evaluation, and the importance of remaining in the ED until their evaluation is complete. ? ? ?  ?Valarie Merino, MD ?10/23/21 [phone] ? ?

## 2021-10-23 NOTE — ED Provider Notes (Signed)
?[facility] DEPT ?Provider Note ? ? ?CSN: [id number] ?Arrival date & time: 10/23/21  0801 ? ?  ? ?History ? ?Chief Complaint  ?Patient presents with  ? Fall  ? ? ?Connie Myers is a 61 y.o. female. ? ?61 year old female with prior medical history as detailed low presents for evaluation after reported fall. ? ?Fall occurred yesterday at work.  She fell on her knees and her right wrist. ? ? ?She is requesting x-rays of these areas. ? ?She is otherwise comfortable. ? ?The history is provided by the patient and medical records.  ?Fall ?This is a new problem. The current episode started yesterday. The problem occurs rarely. The problem has not changed since onset.Nothing aggravates the symptoms. Nothing relieves the symptoms.  ? ?  ? ?Home Medications ?Prior to Admission medications   ?Medication Sig Start Date End Date Taking? Authorizing Provider  ?aspirin EC 81 MG tablet Take 81 mg by mouth daily. Swallow whole. ?Patient not taking: Reported on 09/22/2021    [provider]  ?hydrochlorothiazide (HYDRODIURIL) 25 MG tablet Take 1 tablet (25 mg total) by mouth daily. 10/25/19   Petrucelli, Samantha R, PA-C  ?hydroxyurea (HYDREA) 500 MG capsule Take 1 capsule (500 mg total) by mouth daily. May take with food to minimize GI side effects. 06/29/21   Volanda Napoleon, MD  ?ibuprofen (ADVIL) 200 MG tablet Take 600 mg by mouth every 6 (six) hours as needed.    [provider]  ?irbesartan (AVAPRO) 150 MG tablet Take 1 tablet by mouth daily.    [provider]  ?meloxicam (MOBIC) 15 MG tablet Take 15 mg by mouth daily as needed. ?Patient not taking: Reported on 09/22/2021 05/10/21   [provider]  ?   ? ?Allergies    ?Codeine and Local [lidocaine]   ? ?Review of Systems   ?Review of Systems  ?All other systems reviewed and are negative. ? ?Physical Exam ?Updated Vital Signs ?BP (!) 159/98 (BP Location: Left Arm)   Pulse 84   Temp 98 ?F (36.7 ?C) (Oral)   Resp  18   Ht '5\' 9"'$  (1.753 m)   Wt 108.9 kg   SpO2 98%   BMI 35.44 kg/m?  ?Physical Exam ?Vitals and nursing note reviewed.  ?Constitutional:   ?   General: She is not in acute distress. ?   Appearance: Normal appearance. She is well-developed.  ?HENT:  ?   Head: Normocephalic and atraumatic.  ?Eyes:  ?   Conjunctiva/sclera: Conjunctivae normal.  ?   Pupils: Pupils are equal, round, and reactive to light.  ?Cardiovascular:  ?   Rate and Rhythm: Normal rate and regular rhythm.  ?   Heart sounds: Normal heart sounds.  ?Pulmonary:  ?   Effort: Pulmonary effort is normal. No respiratory distress.  ?   Breath sounds: Normal breath sounds.  ?Abdominal:  ?   General: There is no distension.  ?   Palpations: Abdomen is soft.  ?   Tenderness: There is no abdominal tenderness.  ?Musculoskeletal:     ?   General: No deformity. Normal range of motion.  ?   Cervical back: Normal range of motion and neck supple.  ?   Comments: Mild tenderness to the anterior aspects of bilateral knees.  Patient with normal ambulation.  Patient's knees are without instability.  Full active range of motion of all joints of the lower extremities noted. ? ?Patient with mild tenderness over the palmar aspect of the right  wrist.  Full active range of motion of the right wrist noted.  Distal right upper extremity is neurovascular intact.  ?Skin: ?   General: Skin is warm and dry.  ?Neurological:  ?   General: No focal deficit present.  ?   Mental Status: She is alert and oriented to person, place, and time.  ? ? ?ED Results / Procedures / Treatments   ?Labs ?(all labs ordered are listed, but only abnormal results are displayed) ?Labs Reviewed - No data to display ? ?EKG ?None ? ?Radiology ?DG Wrist Complete Right ? ?Result Date: 10/23/2021 ?CLINICAL DATA:  Pain post fall EXAM: RIGHT WRIST - COMPLETE 3+ VIEW COMPARISON:  None Available. FINDINGS: There is no evidence of fracture or dislocation. Corticated ossicle lateral to the STT articulation. Erosion in  the distal aspect of the capitate laterally. Moderate DJD at the first [facility] articulation. There is no additional evidence of arthropathy or other focal bone abnormality. Soft tissues are unremarkable. IMPRESSION: 1. No acute findings. 2. Degenerative changes as above. Electronically Signed   By: Lucrezia Europe M.D.   On: 10/23/2021 09:22  ? ?DG Knee Complete 4 Views Left ? ?Result Date: 10/23/2021 ?CLINICAL DATA:  Tripped and fell, medial pain EXAM: LEFT KNEE - COMPLETE 4+ VIEW COMPARISON:  None Available. FINDINGS: Marginal spurs about all 3 compartments of the knee most marked medially. Calcification in the distal patellar tendon. There is mild narrowing of the articular cartilage in the medial compartment. No fracture or dislocation. No definite effusion. IMPRESSION: 1. No fracture or other acute finding. 2. Tricompartmental DJD most marked medially. Electronically Signed   By: Lucrezia Europe M.D.   On: 10/23/2021 09:24  ? ?DG Knee Complete 4 Views Right ? ?Result Date: 10/23/2021 ?CLINICAL DATA:  Pain post fall EXAM: RIGHT KNEE - COMPLETE 4+ VIEW COMPARISON:  None Available. FINDINGS: No fracture, dislocation, or effusion. Tricompartmental marginal spurring. Mild narrowing of the articular cartilage in the medial compartment. IMPRESSION: 1. No fracture or other acute finding. 2. Tricompartmental DJD most marked medially. Electronically Signed   By: Lucrezia Europe M.D.   On: 10/23/2021 09:24   ? ?Procedures ?Procedures  ? ? ?Medications Ordered in ED ?Medications  ?acetaminophen (TYLENOL) tablet 1,000 mg (1,000 mg Oral Given 10/23/21 0908)  ? ? ?ED Course/ Medical Decision Making/ A&P ?  ?                        ?Medical Decision Making ?Amount and/or Complexity of Data Reviewed ?Radiology: ordered. ? ?Risk ?OTC drugs. ? ? ? ?Medical Screen Complete ? ?This patient presented to the ED with complaint of fall. ? ?This complaint involves an extensive number of treatment options. The initial differential diagnosis includes, but is not  limited to, trauma related to fall ? ?This presentation is: Acute, Self-Limited, Previously Undiagnosed, Uncertain Prognosis, and Complicated ? ?Patient presents after fall that occurred at work yesterday. ? ?Patient tripped over a pallet ? ?She has contusions to the anterior aspect of both knees into her right wrist. ? ?Imaging obtained is without evidence of fracture. ? ?Patient understands need for close outpatient follow-up.  Strict turn precautions given and understood. ? ?Additional history obtained: ? ?External records from outside sources obtained and reviewed including prior ED visits and prior Inpatient records.  ? ?Imaging Studies ordered: ? ?I ordered imaging studies including plain films of bilateral knees, right wrist ?I independently visualized and interpreted obtained imaging which showed NAD ?I agree with the  radiologist interpretation. ? ?Problem List / ED Course: ? ?Fall ? ? ?Reevaluation: ? ?After the interventions noted above, I reevaluated the patient and found that they have: improved ? ?Disposition: ? ?After consideration of the diagnostic results and the patients response to treatment, I feel that the patent would benefit from close outpatient follow-up.  ? ? ? ? ? ? ? ? ?Final Clinical Impression(s) / ED Diagnoses ?Final diagnoses:  ?Fall, initial encounter  ? ? ?Rx / DC Orders ?ED Discharge Orders   ? ? None  ? ?  ? ? ?  ?Valarie Merino, MD ?10/23/21 1115 ? ?

## 2021-10-23 NOTE — Discharge Instructions (Signed)
Return for any problem.  ?

## 2021-10-23 NOTE — ED Triage Notes (Signed)
Slipped and fell at work yesterday. Rt hip, knee and arm pain. Pt was able to get up by herself.  ?

## 2021-10-24 ENCOUNTER — Encounter: Payer: Self-pay | Admitting: Hematology and Oncology

## 2021-11-04 ENCOUNTER — Encounter: Payer: Self-pay | Admitting: Hematology and Oncology

## 2021-11-26 ENCOUNTER — Other Ambulatory Visit: Payer: Self-pay

## 2021-11-26 ENCOUNTER — Inpatient Hospital Stay (HOSPITAL_BASED_OUTPATIENT_CLINIC_OR_DEPARTMENT_OTHER): Payer: No Typology Code available for payment source | Admitting: Family

## 2021-11-26 ENCOUNTER — Inpatient Hospital Stay: Payer: No Typology Code available for payment source

## 2021-11-26 ENCOUNTER — Other Ambulatory Visit: Payer: No Typology Code available for payment source

## 2021-11-26 ENCOUNTER — Encounter: Payer: Self-pay | Admitting: Family

## 2021-11-26 ENCOUNTER — Ambulatory Visit: Payer: No Typology Code available for payment source | Admitting: Family

## 2021-11-26 ENCOUNTER — Inpatient Hospital Stay: Payer: No Typology Code available for payment source | Attending: Hematology and Oncology

## 2021-11-26 ENCOUNTER — Other Ambulatory Visit: Payer: Self-pay | Admitting: Oncology

## 2021-11-26 VITALS — BP 156/87 | HR 83 | Temp 98.2°F | Resp 18 | Ht 69.0 in | Wt 230.8 lb

## 2021-11-26 DIAGNOSIS — D45 Polycythemia vera: Secondary | ICD-10-CM | POA: Insufficient documentation

## 2021-11-26 DIAGNOSIS — Z1589 Genetic susceptibility to other disease: Secondary | ICD-10-CM

## 2021-11-26 DIAGNOSIS — D500 Iron deficiency anemia secondary to blood loss (chronic): Secondary | ICD-10-CM

## 2021-11-26 LAB — CMP (CANCER CENTER ONLY)
ALT: 8 U/L (ref 0–44)
AST: 10 U/L — ABNORMAL LOW (ref 15–41)
Albumin: 4.6 g/dL (ref 3.5–5.0)
Alkaline Phosphatase: 84 U/L (ref 38–126)
Anion gap: 10 (ref 5–15)
BUN: 16 mg/dL (ref 8–23)
CO2: 25 mmol/L (ref 22–32)
Calcium: 9.4 mg/dL (ref 8.9–10.3)
Chloride: 101 mmol/L (ref 98–111)
Creatinine: 0.69 mg/dL (ref 0.44–1.00)
GFR, Estimated: >60 mL/min (ref >60)
Glucose, Bld: 114 mg/dL — ABNORMAL HIGH (ref 70–99)
Potassium: 4 mmol/L (ref 3.5–5.1)
Sodium: 136 mmol/L (ref 135–145)
Total Bilirubin: 0.5 mg/dL (ref 0.3–1.2)
Total Protein: 7.6 g/dL (ref 6.5–8.1)

## 2021-11-26 LAB — LACTATE DEHYDROGENASE: LDH: 170 U/L (ref 98–192)

## 2021-11-26 LAB — FERRITIN: Ferritin: 8 ng/mL — ABNORMAL LOW (ref 11–307)

## 2021-11-26 LAB — CBC WITH DIFFERENTIAL (CANCER CENTER ONLY)
Abs Immature Granulocytes: 0.1 10*3/uL — ABNORMAL HIGH (ref 0.00–0.07)
Basophils Absolute: 0.2 10*3/uL — ABNORMAL HIGH (ref 0.0–0.1)
Basophils Relative: 2 %
Eosinophils Absolute: 0.1 10*3/uL (ref 0.0–0.5)
Eosinophils Relative: 1 %
HCT: 47 % — ABNORMAL HIGH (ref 36.0–46.0)
Hemoglobin: 14.5 g/dL (ref 12.0–15.0)
Immature Granulocytes: 1 %
Lymphocytes Relative: 10 %
Lymphs Abs: 1.2 10*3/uL (ref 0.7–4.0)
MCH: 22.1 pg — ABNORMAL LOW (ref 26.0–34.0)
MCHC: 30.9 g/dL (ref 30.0–36.0)
MCV: 71.5 fL — ABNORMAL LOW (ref 80.0–100.0)
Monocytes Absolute: 0.5 10*3/uL (ref 0.1–1.0)
Monocytes Relative: 4 %
Neutro Abs: 9.7 10*3/uL — ABNORMAL HIGH (ref 1.7–7.7)
Neutrophils Relative %: 82 %
Platelet Count: 430 10*3/uL — ABNORMAL HIGH (ref 150–400)
RBC: 6.57 MIL/uL — ABNORMAL HIGH (ref 3.87–5.11)
RDW: 17.5 % — ABNORMAL HIGH (ref 11.5–15.5)
WBC Count: 11.8 10*3/uL — ABNORMAL HIGH (ref 4.0–10.5)
nRBC: 0 % (ref 0.0–0.2)

## 2021-11-26 NOTE — Progress Notes (Signed)
Connie Myers presents today for phlebotomy per MD orders. Phlebotomy procedure started at 1430 and ended at 1440 535 grams removed. Patient observed for 30 minutes after procedure without any incident. Patient tolerated procedure well. IV needle removed intact.

## 2021-11-26 NOTE — Progress Notes (Signed)
Hematology and Oncology Follow Up Visit  Connie Myers [id number] 10-May-1961 61 y.o. 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 Lorenso Courier. JAK2 V617F mutation returned as positive. 01/16/2020: bone marrow biopsy confirms hypercellular marrow consistent with polycythemia vera.    Current Therapy: Phlebotomy to maintain Hct < 45% per MD 08/26/2020 Hydroxyurea 500 mg PO daily ASA 103m PO daily   Interim History:  Connie Myers here for follow-up and phlebotomy. She had a fall at work and hit both knees and hands. Thankfully she was not seriously injured but is in PT and on a steroid to help with inflammation.  She has generalized aches and pains.  No fever, chills, n/v, cough, rash, dizziness, SOB, chest pain, palpitations, abdominal pain or changes in bowel or bladder habits.  No swelling in her extremities at this time.  She is still a little sore form the fall.  Appetite and hydration have been good. Weight is stable at 230 lbs.   ECOG Performance Status: 1 - Symptomatic but completely ambulatory  Medications:  Allergies as of 11/26/2021       Reactions   Codeine Nausea Only   Local [lidocaine] Other (See Comments)   Local Anesthesia does not work for her        Medication List        Accurate as of November 26, 2021  2:38 PM. If you have any questions, ask your nurse or doctor.          STOP taking these medications    aspirin EC 81 MG tablet Stopped by: SLottie Dawson NP   meloxicam 15 MG tablet Commonly known as: MOBIC Stopped by: SLottie Dawson NP       TAKE these medications    hydrochlorothiazide 25 MG tablet Commonly known as: HYDRODIURIL Take 1 tablet (25 mg total) by mouth daily.   hydroxyurea 500 MG capsule Commonly known as: HYDREA Take 1 capsule (500 mg total) by mouth daily. May take with food to minimize GI side effects.   ibuprofen 200 MG  tablet Commonly known as: ADVIL Take 600 mg by mouth every 6 (six) hours as needed.   irbesartan 150 MG tablet Commonly known as: AVAPRO Take 1 tablet by mouth daily.        Allergies:  Allergies  Allergen Reactions   Codeine Nausea Only   Local [Lidocaine] Other (See Comments)    Local Anesthesia does not work for her    Past Medical History, Surgical history, Social history, and Family History were reviewed and updated.  Review of Systems: All other 10 point review of systems is negative.   Physical Exam:  height is '5\' 9"'  (1.753 m) and weight is 230 lb 12.8 oz (104.7 kg). Her oral temperature is 98.2 F (36.8 C). Her blood pressure is 156/87 (abnormal) and her pulse is 83. Her respiration is 18 and oxygen saturation is 100%.   Wt Readings from Last 3 Encounters:  11/26/21 230 lb 12.8 oz (104.7 kg)  10/23/21 240 lb (108.9 kg)  09/22/21 230 lb 1.3 oz (104.4 kg)    Ocular: Sclerae unicteric, pupils equal, round and reactive to light Ear-nose-throat: Oropharynx clear, dentition fair Lymphatic: No cervical or supraclavicular adenopathy Lungs no rales or rhonchi, good excursion bilaterally Heart regular rate and rhythm, no murmur appreciated Abd soft, nontender, positive bowel sounds MSK no focal spinal tenderness, no joint edema Neuro: non-focal, well-oriented, appropriate affect Breasts: Deferred  Lab Results  Component Value Date   WBC 11.8 (H) 11/26/2021   HGB 14.5 11/26/2021   HCT 47.0 (H) 11/26/2021   MCV 71.5 (L) 11/26/2021   PLT 430 (H) 11/26/2021   Lab Results  Component Value Date   FERRITIN 11 09/22/2021   IRON 23 (L) 09/22/2021   TIBC 444 09/22/2021   UIBC 421 09/22/2021   IRONPCTSAT 5 (L) 09/22/2021   Lab Results  Component Value Date   RETICCTPCT 1.2 06/29/2021   RBC 6.57 (H) 11/26/2021   No results found for: "KPAFRELGTCHN", "LAMBDASER", "KAPLAMBRATIO" No results found for: "IGGSERUM", "IGA", "IGMSERUM" No results found for:  "TOTALPROTELP", "ALBUMINELP", "A1GS", "A2GS", "BETS", "BETA2SER", "GAMS", "MSPIKE", "SPEI"   Chemistry      Component Value Date/Time   NA 136 09/22/2021 1158   K 3.6 09/22/2021 1158   CL 102 09/22/2021 1158   CO2 27 09/22/2021 1158   BUN 19 09/22/2021 1158   CREATININE 0.71 09/22/2021 1158      Component Value Date/Time   CALCIUM 9.3 09/22/2021 1158   ALKPHOS 94 09/22/2021 1158   AST 11 (L) 09/22/2021 1158   ALT 9 09/22/2021 1158   BILITOT 0.8 09/22/2021 1158       Impression and Plan: Connie Myers is a very pleasant 61 yo caucasian female with polycythemia vera, JAK 2 positive.  Phlebotomy today for Hct 47.0.  Continue same regimen with Hydrea.  Follow-up in 2 months.   Lottie Dawson, NP 6/9/20232:38 PM

## 2021-11-29 LAB — IRON AND IRON BINDING CAPACITY (CC-WL,HP ONLY)
Iron: 10 ug/dL — ABNORMAL LOW (ref 28–170)
Saturation Ratios: 2 % — ABNORMAL LOW (ref 10.4–31.8)
TIBC: 470 ug/dL — ABNORMAL HIGH (ref 250–450)
UIBC: 460 ug/dL — ABNORMAL HIGH (ref 148–442)

## 2022-01-24 ENCOUNTER — Inpatient Hospital Stay (HOSPITAL_BASED_OUTPATIENT_CLINIC_OR_DEPARTMENT_OTHER): Payer: No Typology Code available for payment source | Admitting: Family

## 2022-01-24 ENCOUNTER — Inpatient Hospital Stay: Payer: No Typology Code available for payment source | Attending: Hematology and Oncology

## 2022-01-24 ENCOUNTER — Inpatient Hospital Stay: Payer: No Typology Code available for payment source

## 2022-01-24 ENCOUNTER — Other Ambulatory Visit: Payer: Self-pay

## 2022-01-24 ENCOUNTER — Encounter: Payer: Self-pay | Admitting: Family

## 2022-01-24 VITALS — BP 136/68 | HR 80 | Temp 98.2°F | Resp 186 | Ht 69.0 in | Wt 232.4 lb

## 2022-01-24 DIAGNOSIS — Z79899 Other long term (current) drug therapy: Secondary | ICD-10-CM | POA: Diagnosis not present

## 2022-01-24 DIAGNOSIS — D500 Iron deficiency anemia secondary to blood loss (chronic): Secondary | ICD-10-CM

## 2022-01-24 DIAGNOSIS — D45 Polycythemia vera: Secondary | ICD-10-CM

## 2022-01-24 DIAGNOSIS — D75838 Other thrombocytosis: Secondary | ICD-10-CM | POA: Diagnosis not present

## 2022-01-24 DIAGNOSIS — Z1589 Genetic susceptibility to other disease: Secondary | ICD-10-CM

## 2022-01-24 LAB — CMP (CANCER CENTER ONLY)
ALT: 9 U/L (ref 0–44)
AST: 11 U/L — ABNORMAL LOW (ref 15–41)
Albumin: 4.6 g/dL (ref 3.5–5.0)
Alkaline Phosphatase: 78 U/L (ref 38–126)
Anion gap: 8 (ref 5–15)
BUN: 16 mg/dL (ref 8–23)
CO2: 25 mmol/L (ref 22–32)
Calcium: 9.5 mg/dL (ref 8.9–10.3)
Chloride: 104 mmol/L (ref 98–111)
Creatinine: 0.66 mg/dL (ref 0.44–1.00)
GFR, Estimated: >60 mL/min (ref >60)
Glucose, Bld: 123 mg/dL — ABNORMAL HIGH (ref 70–99)
Potassium: 3.9 mmol/L (ref 3.5–5.1)
Sodium: 137 mmol/L (ref 135–145)
Total Bilirubin: 0.5 mg/dL (ref 0.3–1.2)
Total Protein: 7.2 g/dL (ref 6.5–8.1)

## 2022-01-24 LAB — CBC WITH DIFFERENTIAL (CANCER CENTER ONLY)
Abs Immature Granulocytes: 0.08 10*3/uL — ABNORMAL HIGH (ref 0.00–0.07)
Basophils Absolute: 0.3 10*3/uL — ABNORMAL HIGH (ref 0.0–0.1)
Basophils Relative: 3 %
Eosinophils Absolute: 0.4 10*3/uL (ref 0.0–0.5)
Eosinophils Relative: 4 %
HCT: 44 % (ref 36.0–46.0)
Hemoglobin: 13.7 g/dL (ref 12.0–15.0)
Immature Granulocytes: 1 %
Lymphocytes Relative: 22 %
Lymphs Abs: 2.2 10*3/uL (ref 0.7–4.0)
MCH: 21.9 pg — ABNORMAL LOW (ref 26.0–34.0)
MCHC: 31.1 g/dL (ref 30.0–36.0)
MCV: 70.3 fL — ABNORMAL LOW (ref 80.0–100.0)
Monocytes Absolute: 0.9 10*3/uL (ref 0.1–1.0)
Monocytes Relative: 9 %
Neutro Abs: 6.2 10*3/uL (ref 1.7–7.7)
Neutrophils Relative %: 61 %
Platelet Count: 444 10*3/uL — ABNORMAL HIGH (ref 150–400)
RBC: 6.26 MIL/uL — ABNORMAL HIGH (ref 3.87–5.11)
RDW: 17.2 % — ABNORMAL HIGH (ref 11.5–15.5)
WBC Count: 10.1 10*3/uL (ref 4.0–10.5)
nRBC: 0 % (ref 0.0–0.2)

## 2022-01-24 LAB — LACTATE DEHYDROGENASE: LDH: 183 U/L (ref 98–192)

## 2022-01-24 LAB — FERRITIN: Ferritin: 6 ng/mL — ABNORMAL LOW (ref 11–307)

## 2022-01-24 NOTE — Progress Notes (Signed)
Hematology and Oncology Follow Up Visit  Connie Myers [id number] Jun 06, 1961 61 y.o. 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 Lorenso Courier. JAK2 V617F mutation returned as positive. 01/16/2020: bone marrow biopsy confirms hypercellular marrow consistent with polycythemia vera.    Current Therapy: Phlebotomy to maintain Hct < 45% per MD 08/26/2020 Hydroxyurea 500 mg PO daily ASA 37m PO daily   Interim History:  Ms. FBreneris here today for follow-up. Hct is stable at 44%. She is doing fairly well but is having generalized and and pains all over. She has an appointment with rheumatology in September for further eval.  No swelling, numbness or tingling in her extremities at this time.  No falls or syncope reported.  No fever, chills, n/v, cough, rash, dizziness, SOB, chest pain, palpitations, abdominal pain or changes in bowel or bladder habits.  No blood loss, bruising or petechiae.  Appetite and hydration are good. Her weight is stable at 232 lbs.   ECOG Performance Status: 1 - Symptomatic but completely ambulatory  Medications:  Allergies as of 01/24/2022       Reactions   Codeine Nausea Only   Local [lidocaine] Other (See Comments)   Local Anesthesia does not work for her        Medication List        Accurate as of January 24, 2022  2:24 PM. If you have any questions, ask your nurse or doctor.          hydrochlorothiazide 25 MG tablet Commonly known as: HYDRODIURIL Take 1 tablet (25 mg total) by mouth daily.   hydroxyurea 500 MG capsule Commonly known as: HYDREA Take 1 capsule (500 mg total) by mouth daily. May take with food to minimize GI side effects.   ibuprofen 200 MG tablet Commonly known as: ADVIL Take 600 mg by mouth every 6 (six) hours as needed.   irbesartan 150 MG tablet Commonly known as: AVAPRO Take 1 tablet by mouth daily.        Allergies:   Allergies  Allergen Reactions   Codeine Nausea Only   Local [Lidocaine] Other (See Comments)    Local Anesthesia does not work for her    Past Medical History, Surgical history, Social history, and Family History were reviewed and updated.  Review of Systems: All other 10 point review of systems is negative.   Physical Exam:  vitals were not taken for this visit.   Wt Readings from Last 3 Encounters:  11/26/21 230 lb 12.8 oz (104.7 kg)  10/23/21 240 lb (108.9 kg)  09/22/21 230 lb 1.3 oz (104.4 kg)    Ocular: Sclerae unicteric, pupils equal, round and reactive to light Ear-nose-throat: Oropharynx clear, dentition fair Lymphatic: No cervical or supraclavicular adenopathy Lungs no rales or rhonchi, good excursion bilaterally Heart regular rate and rhythm, no murmur appreciated Abd soft, nontender, positive bowel sounds MSK no focal spinal tenderness, no joint edema Neuro: non-focal, well-oriented, appropriate affect Breasts: Deferred   Lab Results  Component Value Date   WBC 10.1 01/24/2022   HGB 13.7 01/24/2022   HCT 44.0 01/24/2022   MCV 70.3 (L) 01/24/2022   PLT 444 (H) 01/24/2022   Lab Results  Component Value Date   FERRITIN 8 (L) 11/26/2021   IRON 10 (L) 11/26/2021   TIBC 470 (H) 11/26/2021   UIBC 460 (H) 11/26/2021   IRONPCTSAT 2 (L) 11/26/2021   Lab Results  Component Value Date  RETICCTPCT 1.2 06/29/2021   RBC 6.26 (H) 01/24/2022   No results found for: "KPAFRELGTCHN", "LAMBDASER", "KAPLAMBRATIO" No results found for: "IGGSERUM", "IGA", "IGMSERUM" No results found for: "TOTALPROTELP", "ALBUMINELP", "A1GS", "A2GS", "BETS", "BETA2SER", "GAMS", "MSPIKE", "SPEI"   Chemistry      Component Value Date/Time   NA 136 11/26/2021 1408   K 4.0 11/26/2021 1408   CL 101 11/26/2021 1408   CO2 25 11/26/2021 1408   BUN 16 11/26/2021 1408   CREATININE 0.69 11/26/2021 1408      Component Value Date/Time   CALCIUM 9.4 11/26/2021 1408   ALKPHOS 84 11/26/2021  1408   AST 10 (L) 11/26/2021 1408   ALT 8 11/26/2021 1408   BILITOT 0.5 11/26/2021 1408       Impression and Plan: Ms. Staff is a very pleasant 61 yo caucasian female with polycythemia vera, JAK 2 positive.  No phlebotomy needed, Hct 44%.  Continue same regimen with Hydrea.  Lab check and phlebotomy every 2 months and follow-up in 4 months.   Lottie Dawson, NP 8/7/20232:24 PM

## 2022-01-25 LAB — IRON AND IRON BINDING CAPACITY (CC-WL,HP ONLY)
Iron: 29 ug/dL (ref 28–170)
Saturation Ratios: 7 % — ABNORMAL LOW (ref 10.4–31.8)
TIBC: 449 ug/dL (ref 250–450)
UIBC: 420 ug/dL (ref 148–442)

## 2022-02-10 ENCOUNTER — Other Ambulatory Visit: Payer: Self-pay | Admitting: *Deleted

## 2022-02-10 MED ORDER — HYDROXYUREA 500 MG PO CAPS
500.0000 mg | ORAL_CAPSULE | Freq: Every day | ORAL | 3 refills | Status: DC
Start: 2022-02-10 — End: 2022-06-28

## 2022-03-22 ENCOUNTER — Other Ambulatory Visit: Payer: Self-pay | Admitting: Family Medicine

## 2022-03-22 DIAGNOSIS — Z1231 Encounter for screening mammogram for malignant neoplasm of breast: Secondary | ICD-10-CM

## 2022-03-28 ENCOUNTER — Inpatient Hospital Stay: Payer: No Typology Code available for payment source

## 2022-03-28 ENCOUNTER — Inpatient Hospital Stay: Payer: No Typology Code available for payment source | Attending: Hematology and Oncology

## 2022-03-28 DIAGNOSIS — D75838 Other thrombocytosis: Secondary | ICD-10-CM | POA: Insufficient documentation

## 2022-03-28 DIAGNOSIS — Z1589 Genetic susceptibility to other disease: Secondary | ICD-10-CM

## 2022-03-28 DIAGNOSIS — D500 Iron deficiency anemia secondary to blood loss (chronic): Secondary | ICD-10-CM

## 2022-03-28 DIAGNOSIS — D45 Polycythemia vera: Secondary | ICD-10-CM | POA: Insufficient documentation

## 2022-03-28 DIAGNOSIS — Z79899 Other long term (current) drug therapy: Secondary | ICD-10-CM | POA: Insufficient documentation

## 2022-03-28 LAB — IRON AND IRON BINDING CAPACITY (CC-WL,HP ONLY)
Iron: 9 ug/dL — ABNORMAL LOW (ref 28–170)
Saturation Ratios: 2 % — ABNORMAL LOW (ref 10.4–31.8)
TIBC: 433 ug/dL (ref 250–450)
UIBC: 424 ug/dL (ref 148–442)

## 2022-03-28 LAB — CBC WITH DIFFERENTIAL (CANCER CENTER ONLY)
Abs Immature Granulocytes: 0.13 10*3/uL — ABNORMAL HIGH (ref 0.00–0.07)
Basophils Absolute: 0.3 10*3/uL — ABNORMAL HIGH (ref 0.0–0.1)
Basophils Relative: 3 %
Eosinophils Absolute: 0.5 10*3/uL (ref 0.0–0.5)
Eosinophils Relative: 5 %
HCT: 45.2 % (ref 36.0–46.0)
Hemoglobin: 13.9 g/dL (ref 12.0–15.0)
Immature Granulocytes: 1 %
Lymphocytes Relative: 18 %
Lymphs Abs: 1.9 10*3/uL (ref 0.7–4.0)
MCH: 21.5 pg — ABNORMAL LOW (ref 26.0–34.0)
MCHC: 30.8 g/dL (ref 30.0–36.0)
MCV: 69.8 fL — ABNORMAL LOW (ref 80.0–100.0)
Monocytes Absolute: 1 10*3/uL (ref 0.1–1.0)
Monocytes Relative: 10 %
Neutro Abs: 6.9 10*3/uL (ref 1.7–7.7)
Neutrophils Relative %: 63 %
Platelet Count: 414 10*3/uL — ABNORMAL HIGH (ref 150–400)
RBC: 6.48 MIL/uL — ABNORMAL HIGH (ref 3.87–5.11)
RDW: 18.8 % — ABNORMAL HIGH (ref 11.5–15.5)
WBC Count: 10.7 10*3/uL — ABNORMAL HIGH (ref 4.0–10.5)
nRBC: 0 % (ref 0.0–0.2)

## 2022-03-28 LAB — CMP (CANCER CENTER ONLY)
ALT: 9 U/L (ref 0–44)
AST: 12 U/L — ABNORMAL LOW (ref 15–41)
Albumin: 4.3 g/dL (ref 3.5–5.0)
Alkaline Phosphatase: 91 U/L (ref 38–126)
Anion gap: 7 (ref 5–15)
BUN: 18 mg/dL (ref 8–23)
CO2: 26 mmol/L (ref 22–32)
Calcium: 9.3 mg/dL (ref 8.9–10.3)
Chloride: 103 mmol/L (ref 98–111)
Creatinine: 0.59 mg/dL (ref 0.44–1.00)
GFR, Estimated: >60 mL/min (ref >60)
Glucose, Bld: 88 mg/dL (ref 70–99)
Potassium: 4 mmol/L (ref 3.5–5.1)
Sodium: 136 mmol/L (ref 135–145)
Total Bilirubin: 0.6 mg/dL (ref 0.3–1.2)
Total Protein: 7.5 g/dL (ref 6.5–8.1)

## 2022-03-28 LAB — LACTATE DEHYDROGENASE: LDH: 184 U/L (ref 98–192)

## 2022-03-28 LAB — FERRITIN: Ferritin: 7 ng/mL — ABNORMAL LOW (ref 11–307)

## 2022-03-28 NOTE — Progress Notes (Signed)
This nurse reviewed today's labs with patient. HCT is 45.2. Treatment parameters are to do a phlebotomy to keep HCT <45.0. Assessment performed and patient denies any symptoms. I offered to do a phlebotomy but she wants to wait due to being asymptomatic. Reviewed her schedule with her. Also, instructed her to call the office if she starts to have any symptoms and we can check her labs. She verbalized understanding.

## 2022-04-15 ENCOUNTER — Ambulatory Visit: Payer: No Typology Code available for payment source

## 2022-05-24 ENCOUNTER — Ambulatory Visit
Admission: RE | Admit: 2022-05-24 | Discharge: 2022-05-24 | Disposition: A | Discharge status: Home / Self Care | Payer: No Typology Code available for payment source | Source: Ambulatory Visit | Attending: Family Medicine | Admitting: Family Medicine

## 2022-05-24 DIAGNOSIS — Z1231 Encounter for screening mammogram for malignant neoplasm of breast: Secondary | ICD-10-CM

## 2022-05-27 ENCOUNTER — Inpatient Hospital Stay: Payer: No Typology Code available for payment source

## 2022-05-27 ENCOUNTER — Inpatient Hospital Stay: Payer: No Typology Code available for payment source | Attending: Hematology and Oncology | Admitting: Family

## 2022-06-23 ENCOUNTER — Telehealth: Payer: Self-pay | Admitting: *Deleted

## 2022-06-23 NOTE — Telephone Encounter (Signed)
Received call from patient requesting to return to this practice. She had been having her appts @ [place] with Dr. [name] but it is becoming difficult for her to drive there from her job. Advised that we are happy to have her seen here. Advised that a scheduler will call her for an appt in the next week. Dr. Lorenso Courier made aware and is agreeable to seeing her again.  Scheduling message sent

## 2022-06-28 ENCOUNTER — Inpatient Hospital Stay: Payer: No Typology Code available for payment source | Attending: Hematology and Oncology

## 2022-06-28 ENCOUNTER — Inpatient Hospital Stay (HOSPITAL_BASED_OUTPATIENT_CLINIC_OR_DEPARTMENT_OTHER): Payer: No Typology Code available for payment source | Admitting: Hematology and Oncology

## 2022-06-28 ENCOUNTER — Other Ambulatory Visit: Payer: Self-pay

## 2022-06-28 ENCOUNTER — Ambulatory Visit: Payer: No Typology Code available for payment source | Admitting: Physician Assistant

## 2022-06-28 ENCOUNTER — Other Ambulatory Visit: Payer: No Typology Code available for payment source

## 2022-06-28 VITALS — BP 166/72 | HR 67 | Temp 97.6°F | Resp 17 | Wt 236.9 lb

## 2022-06-28 DIAGNOSIS — Z1589 Genetic susceptibility to other disease: Secondary | ICD-10-CM

## 2022-06-28 DIAGNOSIS — E611 Iron deficiency: Secondary | ICD-10-CM | POA: Diagnosis not present

## 2022-06-28 DIAGNOSIS — D75838 Other thrombocytosis: Secondary | ICD-10-CM | POA: Insufficient documentation

## 2022-06-28 DIAGNOSIS — D45 Polycythemia vera: Secondary | ICD-10-CM

## 2022-06-28 DIAGNOSIS — Z803 Family history of malignant neoplasm of breast: Secondary | ICD-10-CM | POA: Diagnosis not present

## 2022-06-28 DIAGNOSIS — D500 Iron deficiency anemia secondary to blood loss (chronic): Secondary | ICD-10-CM

## 2022-06-28 LAB — CMP (CANCER CENTER ONLY)
ALT: 10 U/L (ref 0–44)
AST: 11 U/L — ABNORMAL LOW (ref 15–41)
Albumin: 4.3 g/dL (ref 3.5–5.0)
Alkaline Phosphatase: 90 U/L (ref 38–126)
Anion gap: 5 (ref 5–15)
BUN: 23 mg/dL (ref 8–23)
CO2: 28 mmol/L (ref 22–32)
Calcium: 9 mg/dL (ref 8.9–10.3)
Chloride: 106 mmol/L (ref 98–111)
Creatinine: 0.7 mg/dL (ref 0.44–1.00)
GFR, Estimated: >60 mL/min (ref >60)
Glucose, Bld: 92 mg/dL (ref 70–99)
Potassium: 3.9 mmol/L (ref 3.5–5.1)
Sodium: 139 mmol/L (ref 135–145)
Total Bilirubin: 0.5 mg/dL (ref 0.3–1.2)
Total Protein: 7.5 g/dL (ref 6.5–8.1)

## 2022-06-28 LAB — CBC WITH DIFFERENTIAL (CANCER CENTER ONLY)
Abs Immature Granulocytes: 0.09 10*3/uL — ABNORMAL HIGH (ref 0.00–0.07)
Basophils Absolute: 0.2 10*3/uL — ABNORMAL HIGH (ref 0.0–0.1)
Basophils Relative: 2 %
Eosinophils Absolute: 0.5 10*3/uL (ref 0.0–0.5)
Eosinophils Relative: 5 %
HCT: 44.8 % (ref 36.0–46.0)
Hemoglobin: 14.6 g/dL (ref 12.0–15.0)
Immature Granulocytes: 1 %
Lymphocytes Relative: 18 %
Lymphs Abs: 1.7 10*3/uL (ref 0.7–4.0)
MCH: 22.8 pg — ABNORMAL LOW (ref 26.0–34.0)
MCHC: 32.6 g/dL (ref 30.0–36.0)
MCV: 70 fL — ABNORMAL LOW (ref 80.0–100.0)
Monocytes Absolute: 0.9 10*3/uL (ref 0.1–1.0)
Monocytes Relative: 9 %
Neutro Abs: 6.2 10*3/uL (ref 1.7–7.7)
Neutrophils Relative %: 65 %
Platelet Count: 396 10*3/uL (ref 150–400)
RBC: 6.4 MIL/uL — ABNORMAL HIGH (ref 3.87–5.11)
RDW: 18.6 % — ABNORMAL HIGH (ref 11.5–15.5)
WBC Count: 9.7 10*3/uL (ref 4.0–10.5)
nRBC: 0 % (ref 0.0–0.2)

## 2022-06-28 LAB — IRON AND IRON BINDING CAPACITY (CC-WL,HP ONLY)
Iron: 16 ug/dL — ABNORMAL LOW (ref 28–170)
Saturation Ratios: 4 % — ABNORMAL LOW (ref 10.4–31.8)
TIBC: 440 ug/dL (ref 250–450)
UIBC: 424 ug/dL (ref 148–442)

## 2022-06-28 LAB — LACTATE DEHYDROGENASE: LDH: 167 U/L (ref 98–192)

## 2022-06-28 LAB — FERRITIN: Ferritin: 8 ng/mL — ABNORMAL LOW (ref 11–307)

## 2022-06-28 MED ORDER — HYDROXYUREA 500 MG PO CAPS: 500.0000 mg | ORAL_CAPSULE | Freq: Two times a day (BID) | ORAL | 2 refills | Status: DC

## 2022-06-28 NOTE — Progress Notes (Signed)
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 First CBC on record 2) 12/29/2017: WBC 14.1, Hgb 15.0, MCV 72.1, Plt 1074 3) 12/28/2018: WBC 10.9, Hgb 16.3, MCV 67.8, Hgb 982 4) 10/25/2019: WBC 9.5, Hgb 15.8, MCV 67.9, Plt 976 5) 12/19/2019: establish care with Dr. Lorenso Courier. JAK2 V617F mutation returned as positive. Patient already on ASA '81mg'$  PO daily.  6) 01/16/2020: bone marrow biopsy confirms hypercellular marrow consistent with polycythemia vera. Started hydroxyurea 500 mg PO daily.  7) Transferred care to Dr. [name] at [facility]. 8) 06/28/2022: transition care back to Dr. Lorenso Courier at [place] History:  Connie Myers 62 y.o. female with medical history significant for JAK2 positive PV who presents for a follow up visit. The patient's last visit was on 01/24/2022 at which time she was seen by Lottie Dawson. In the interim since the last visit she has continued treatment with hydroxyurea daily  On exam today [name] notes that she would like to transfer her care back to [facility].  We are happy to receive her today.  She reports that she continues to take her hydroxyurea pill 1 pill/day.  It has caused some thinning of her hair and reports that she only has "one fourth of my her left".  She notes that she did not take it for a brief stent of time and her hematocrit levels did veer out of control.  She notes that it has been "a long time since my last lobotomy".  She notes that she feels wiped out and fatigued overall.  She is also not currently taking her baby aspirin p.o. daily.  She notes she does not have any signs or symptoms concerning  for blood clot such as leg swelling, leg pain, shortness of breath, or chest pain.  She denies having any issues with bleeding, bruising, or dark stools.  A full 10 point ROS is listed below.   MEDICAL HISTORY:  Past Medical History:  Diagnosis Date   Allergic rhinitis    Anemia    Arthritis    BMI 35.0-35.9,adult    BMI 45.4-09.8,JXBJY    Complication of anesthesia    " I don't tolerate the caine's (Novacaine, Lidocaine etc. well " also, no codiene.    GERD (gastroesophageal reflux disease)    Hammertoe of right foot    Headache    Hypertension    Plantar wart, right foot    Polycythemia vera ([place])    Severe obesity (HCC)    Situational stress    Sleep apnea    PMH: " I don't have it now "   Thrombocytosis    Ventral hernia    Wears glasses     SURGICAL HISTORY: Past Surgical History:  Procedure Laterality Date   CESAREAN SECTION     CHOLECYSTECTOMY     COLONOSCOPY     HERNIA REPAIR     INSERTION OF MESH N/A 12/05/2017   Procedure: INSERTION OF MESH;  Surgeon: Donnie Mesa, MD;  Location: [place];  Service: General;  Laterality: N/A;   VENTRAL HERNIA REPAIR  12/05/2017   OPEN VENTRAL HERNIA REPAIR ERAS PATHWAY w/mesh   VENTRAL HERNIA REPAIR N/A 12/05/2017  Procedure: OPEN VENTRAL HERNIA REPAIR ERAS PATHWAY;  Surgeon: Donnie Mesa, MD;  Location: [facility] OR;  Service: General;  Laterality: N/A;   VENTRAL HERNIA REPAIR  01/01/2019   VENTRAL HERNIA REPAIR N/A 01/01/2019   Procedure: LAPAROSCOPIC VENTRAL HERNIA REPAIR WITH MESH;  Surgeon: Donnie Mesa, MD;  Location: [place];  Service: General;  Laterality: N/A;   WISDOM TOOTH EXTRACTION     WOUND EXPLORATION N/A 12/29/2017   Procedure: ABDOMINAL WOUND EXPLORATION;  Surgeon: Donnie Mesa, MD;  Location: [place];  Service: General;  Laterality: N/A;  LMA    SOCIAL HISTORY: Social History   Socioeconomic History   Marital status: Married    Spouse name: Not on file   Number of children: Not on file   Years of education:  Not on file   Highest education level: Not on file  Occupational History   Not on file  Tobacco Use   Smoking status: Never   Smokeless tobacco: Never  Vaping Use   Vaping Use: Never used  Substance and Sexual Activity   Alcohol use: Yes    Comment: occasionally   Drug use: Not Currently   Sexual activity: Not on file  Other Topics Concern   Not on file  Social History Narrative   Not on file   Social Determinants of Health   Financial Resource Strain: Not on file  Food Insecurity: Not on file  Transportation Needs: Not on file  Physical Activity: Not on file  Stress: Not on file  Social Connections: Not on file  Intimate Partner Violence: Not on file    FAMILY HISTORY: Family History  Problem Relation Age of Onset   Heart attack Mother    Breast cancer Mother    Rashes / Skin problems Father    Lupus Sister     ALLERGIES:  is allergic to codeine and local [lidocaine].  MEDICATIONS:  Current Outpatient Medications  Medication Sig Dispense Refill   hydroxyurea (HYDREA) 500 MG capsule Take 1 capsule (500 mg total) by mouth 2 (two) times daily. May take with food to minimize GI side effects. 180 capsule 2   ibuprofen (ADVIL) 200 MG tablet Take 600 mg by mouth every 6 (six) hours as needed.     No current facility-administered medications for this visit.    REVIEW OF SYSTEMS:   Constitutional: ( - ) fevers, ( - )  chills , ( - ) night sweats Eyes: ( - ) blurriness of vision, ( - ) double vision, ( - ) watery eyes Ears, nose, mouth, throat, and face: ( - ) mucositis, ( - ) sore throat Respiratory: ( - ) cough, ( - ) dyspnea, ( - ) wheezes Cardiovascular: ( - ) palpitation, ( - ) chest discomfort, ( - ) lower extremity swelling Gastrointestinal:  ( - ) nausea, ( - ) heartburn, ( - ) change in bowel habits Skin: ( - ) abnormal skin rashes Lymphatics: ( - ) new lymphadenopathy, ( - ) easy bruising Neurological: ( - ) numbness, ( - ) tingling, ( - ) new  weaknesses Behavioral/Psych: ( - ) mood change, ( - ) new changes  All other systems were reviewed with the patient and are negative.  PHYSICAL EXAMINATION: ECOG PERFORMANCE STATUS: 0 - Asymptomatic  Vitals:   06/28/22 1225  BP: (!) 166/72  Pulse: 67  Resp: 17  Temp: 97.6 F (36.4 C)  SpO2: 98%   Filed Weights   06/28/22 1225  Weight: 236 lb 14.4 oz (107.5 kg)  GENERAL: well appearing middle aged Caucasian female. alert, no distress and comfortable SKIN: skin color, texture, turgor are normal, no rashes or significant lesions EYES: conjunctiva are pink and non-injected, sclera clear LUNGS: clear to auscultation and percussion with normal breathing effort HEART: regular rate & rhythm and no murmurs and no lower extremity edema Musculoskeletal: no cyanosis of digits and no clubbing  PSYCH: alert & oriented x 3, fluent speech NEURO: no focal motor/sensory deficits  LABORATORY DATA:  I have reviewed the data as listed    Latest Ref Rng & Units 06/28/2022   10:56 AM 03/28/2022   10:38 AM 01/24/2022    1:56 PM  CBC  WBC 4.0 - 10.5 K/uL 9.7  10.7  10.1   Hemoglobin 12.0 - 15.0 g/dL 14.6  13.9  13.7   Hematocrit 36.0 - 46.0 % 44.8  45.2  44.0   Platelets 150 - 400 K/uL 396  414  444        Latest Ref Rng & Units 06/28/2022   10:56 AM 03/28/2022   10:38 AM 01/24/2022    1:56 PM  CMP  Glucose 70 - 99 mg/dL 92  88  123   BUN 8 - 23 mg/dL '23  18  16   '$ Creatinine 0.44 - 1.00 mg/dL 0.70  0.59  0.66   Sodium 135 - 145 mmol/L 139  136  137   Potassium 3.5 - 5.1 mmol/L 3.9  4.0  3.9   Chloride 98 - 111 mmol/L 106  103  104   CO2 22 - 32 mmol/L '28  26  25   '$ Calcium 8.9 - 10.3 mg/dL 9.0  9.3  9.5   Total Protein 6.5 - 8.1 g/dL 7.5  7.5  7.2   Total Bilirubin 0.3 - 1.2 mg/dL 0.5  0.6  0.5   Alkaline Phos 38 - 126 U/L 90  91  78   AST 15 - 41 U/L '11  12  11   '$ ALT 0 - 44 U/L '10  9  9     '$ RADIOGRAPHIC STUDIES:  No results found.   ASSESSMENT & PLAN Connie POSTER 62 y.o.  female with medical history significant for JAK2 positive PV who presents for a follow up visit. After review of the labs, discussion with the patient and review of the prior records her findings are most consistent with polycythemia vera.  The findings show that the patient has iron deficiency, low EPO levels, and marked erythrocytosis and thrombocytosis.  It is most likely that the patient has polycythemia vera which is caused an overproduction of red blood cells resulting in decreased iron stores and subsequent development of thrombocytosis.  The treatments at this time are designed to decrease her risk of thrombosis.  As such we will continue her on 81 mg ASA daily and will add hydroxyurea 500 mg p.o. daily as cytoreductive therapy.  Additionally in order to drop her levels further we will have a target hematocrit of 42%.  For this we will be performing phlebotomy to drop her levels to target.  Previously we discussed the risk of developing acute leukemia as well as myelofibrosis.  We will start phlebotomies as soon as we can and the hydroxyurea prescription has already been prescribed.  We will plan to see her back in approximately 1 month's time in order to evaluate how she is tolerating therapy.  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 --avoid iron supplementation/iron rich foods as our goal is to decrease the bodies iron supply. --discussed risk for development of acute leukemia/myelofibrosis --RTC in 2 months with interval labs at 2 weeks in 4 weeks.  No orders of the defined types were placed in this encounter.   All questions were answered. The patient knows to call the clinic with any  problems, questions or concerns.  A total of more than 30 minutes were spent on this encounter and over half of that time was spent on counseling and coordination of care as outlined above.   [name], MD Department of Hematology/Oncology [place] at [facility] Phone: [phone] Pager: [phone] Email: [email or web address]  06/28/2022 3:02 PM

## 2022-07-01 ENCOUNTER — Encounter: Payer: Self-pay | Admitting: Hematology and Oncology

## 2022-07-12 ENCOUNTER — Other Ambulatory Visit: Payer: Self-pay

## 2022-07-12 ENCOUNTER — Other Ambulatory Visit: Payer: Self-pay | Admitting: Hematology and Oncology

## 2022-07-12 ENCOUNTER — Inpatient Hospital Stay: Payer: No Typology Code available for payment source

## 2022-07-12 ENCOUNTER — Other Ambulatory Visit: Payer: No Typology Code available for payment source

## 2022-07-12 DIAGNOSIS — D75839 Thrombocytosis, unspecified: Secondary | ICD-10-CM

## 2022-07-12 DIAGNOSIS — D45 Polycythemia vera: Secondary | ICD-10-CM

## 2022-07-12 DIAGNOSIS — D500 Iron deficiency anemia secondary to blood loss (chronic): Secondary | ICD-10-CM

## 2022-07-12 LAB — CBC WITH DIFFERENTIAL (CANCER CENTER ONLY)
Abs Immature Granulocytes: 0.18 10*3/uL — ABNORMAL HIGH (ref 0.00–0.07)
Basophils Absolute: 0.2 10*3/uL — ABNORMAL HIGH (ref 0.0–0.1)
Basophils Relative: 2 %
Eosinophils Absolute: 0.7 10*3/uL — ABNORMAL HIGH (ref 0.0–0.5)
Eosinophils Relative: 6 %
HCT: 45.8 % (ref 36.0–46.0)
Hemoglobin: 14.4 g/dL (ref 12.0–15.0)
Immature Granulocytes: 2 %
Lymphocytes Relative: 21 %
Lymphs Abs: 2.3 10*3/uL (ref 0.7–4.0)
MCH: 22 pg — ABNORMAL LOW (ref 26.0–34.0)
MCHC: 31.4 g/dL (ref 30.0–36.0)
MCV: 69.8 fL — ABNORMAL LOW (ref 80.0–100.0)
Monocytes Absolute: 1.2 10*3/uL — ABNORMAL HIGH (ref 0.1–1.0)
Monocytes Relative: 11 %
Neutro Abs: 6.5 10*3/uL (ref 1.7–7.7)
Neutrophils Relative %: 58 %
Platelet Count: 443 10*3/uL — ABNORMAL HIGH (ref 150–400)
RBC: 6.56 MIL/uL — ABNORMAL HIGH (ref 3.87–5.11)
RDW: 18.5 % — ABNORMAL HIGH (ref 11.5–15.5)
WBC Count: 11 10*3/uL — ABNORMAL HIGH (ref 4.0–10.5)
nRBC: 0 % (ref 0.0–0.2)

## 2022-07-12 LAB — IRON AND IRON BINDING CAPACITY (CC-WL,HP ONLY)
Iron: 17 ug/dL — ABNORMAL LOW (ref 28–170)
Saturation Ratios: 4 % — ABNORMAL LOW (ref 10.4–31.8)
TIBC: 440 ug/dL (ref 250–450)
UIBC: 423 ug/dL (ref 148–442)

## 2022-07-12 LAB — CMP (CANCER CENTER ONLY)
ALT: 9 U/L (ref 0–44)
AST: 12 U/L — ABNORMAL LOW (ref 15–41)
Albumin: 4.1 g/dL (ref 3.5–5.0)
Alkaline Phosphatase: 86 U/L (ref 38–126)
Anion gap: 6 (ref 5–15)
BUN: 24 mg/dL — ABNORMAL HIGH (ref 8–23)
CO2: 26 mmol/L (ref 22–32)
Calcium: 8.9 mg/dL (ref 8.9–10.3)
Chloride: 105 mmol/L (ref 98–111)
Creatinine: 0.63 mg/dL (ref 0.44–1.00)
GFR, Estimated: >60 mL/min (ref >60)
Glucose, Bld: 96 mg/dL (ref 70–99)
Potassium: 4.2 mmol/L (ref 3.5–5.1)
Sodium: 137 mmol/L (ref 135–145)
Total Bilirubin: 0.4 mg/dL (ref 0.3–1.2)
Total Protein: 7 g/dL (ref 6.5–8.1)

## 2022-07-12 LAB — RETIC PANEL
Immature Retic Fract: 12.2 % (ref 2.3–15.9)
RBC.: 6.47 MIL/uL — ABNORMAL HIGH (ref 3.87–5.11)
Retic Count, Absolute: 84.8 10*3/uL (ref 19.0–186.0)
Retic Ct Pct: 1.3 % (ref 0.4–3.1)
Reticulocyte Hemoglobin: 25 pg — ABNORMAL LOW (ref >27.9)

## 2022-07-12 LAB — LACTATE DEHYDROGENASE: LDH: 171 U/L (ref 98–192)

## 2022-07-13 LAB — FERRITIN: Ferritin: 7 ng/mL — ABNORMAL LOW (ref 11–307)

## 2022-07-26 ENCOUNTER — Inpatient Hospital Stay: Payer: No Typology Code available for payment source | Attending: Hematology and Oncology

## 2022-07-26 ENCOUNTER — Other Ambulatory Visit: Payer: No Typology Code available for payment source

## 2022-07-26 ENCOUNTER — Other Ambulatory Visit: Payer: Self-pay

## 2022-07-26 DIAGNOSIS — D500 Iron deficiency anemia secondary to blood loss (chronic): Secondary | ICD-10-CM

## 2022-07-26 DIAGNOSIS — E611 Iron deficiency: Secondary | ICD-10-CM | POA: Insufficient documentation

## 2022-07-26 DIAGNOSIS — D45 Polycythemia vera: Secondary | ICD-10-CM | POA: Insufficient documentation

## 2022-07-26 DIAGNOSIS — D75839 Thrombocytosis, unspecified: Secondary | ICD-10-CM

## 2022-07-26 DIAGNOSIS — D75838 Other thrombocytosis: Secondary | ICD-10-CM | POA: Diagnosis not present

## 2022-07-26 DIAGNOSIS — Z803 Family history of malignant neoplasm of breast: Secondary | ICD-10-CM | POA: Diagnosis not present

## 2022-07-26 LAB — IRON AND IRON BINDING CAPACITY (CC-WL,HP ONLY)
Iron: 16 ug/dL — ABNORMAL LOW (ref 28–170)
Saturation Ratios: 4 % — ABNORMAL LOW (ref 10.4–31.8)
TIBC: 421 ug/dL (ref 250–450)
UIBC: 405 ug/dL (ref 148–442)

## 2022-07-26 LAB — CBC WITH DIFFERENTIAL (CANCER CENTER ONLY)
Abs Immature Granulocytes: 0.09 10*3/uL — ABNORMAL HIGH (ref 0.00–0.07)
Basophils Absolute: 0.3 10*3/uL — ABNORMAL HIGH (ref 0.0–0.1)
Basophils Relative: 3 %
Eosinophils Absolute: 0.6 10*3/uL — ABNORMAL HIGH (ref 0.0–0.5)
Eosinophils Relative: 5 %
HCT: 46.5 % — ABNORMAL HIGH (ref 36.0–46.0)
Hemoglobin: 14.8 g/dL (ref 12.0–15.0)
Immature Granulocytes: 1 %
Lymphocytes Relative: 20 %
Lymphs Abs: 2.1 10*3/uL (ref 0.7–4.0)
MCH: 22.6 pg — ABNORMAL LOW (ref 26.0–34.0)
MCHC: 31.8 g/dL (ref 30.0–36.0)
MCV: 71 fL — ABNORMAL LOW (ref 80.0–100.0)
Monocytes Absolute: 1.1 10*3/uL — ABNORMAL HIGH (ref 0.1–1.0)
Monocytes Relative: 10 %
Neutro Abs: 6.4 10*3/uL (ref 1.7–7.7)
Neutrophils Relative %: 61 %
Platelet Count: 400 10*3/uL (ref 150–400)
RBC: 6.55 MIL/uL — ABNORMAL HIGH (ref 3.87–5.11)
RDW: 18.4 % — ABNORMAL HIGH (ref 11.5–15.5)
WBC Count: 10.5 10*3/uL (ref 4.0–10.5)
nRBC: 0 % (ref 0.0–0.2)

## 2022-07-26 LAB — CMP (CANCER CENTER ONLY)
ALT: 8 U/L (ref 0–44)
AST: 11 U/L — ABNORMAL LOW (ref 15–41)
Albumin: 4.1 g/dL (ref 3.5–5.0)
Alkaline Phosphatase: 79 U/L (ref 38–126)
Anion gap: 6 (ref 5–15)
BUN: 21 mg/dL (ref 8–23)
CO2: 27 mmol/L (ref 22–32)
Calcium: 9.1 mg/dL (ref 8.9–10.3)
Chloride: 105 mmol/L (ref 98–111)
Creatinine: 0.65 mg/dL (ref 0.44–1.00)
GFR, Estimated: >60 mL/min (ref >60)
Glucose, Bld: 105 mg/dL — ABNORMAL HIGH (ref 70–99)
Potassium: 4.7 mmol/L (ref 3.5–5.1)
Sodium: 138 mmol/L (ref 135–145)
Total Bilirubin: 0.5 mg/dL (ref 0.3–1.2)
Total Protein: 7 g/dL (ref 6.5–8.1)

## 2022-07-27 LAB — FERRITIN: Ferritin: 7 ng/mL — ABNORMAL LOW (ref 11–307)

## 2022-08-12 ENCOUNTER — Encounter: Payer: Self-pay | Admitting: Hematology and Oncology

## 2022-08-19 IMAGING — CR DG KNEE COMPLETE 4+V*R*
4 series · 4 of 4 positions shown · non-contrast
Comparison: None Available.

CLINICAL DATA: Pain post fall

EXAM:
RIGHT KNEE - COMPLETE 4+ VIEW

[t knee obl right (1 of 3)]
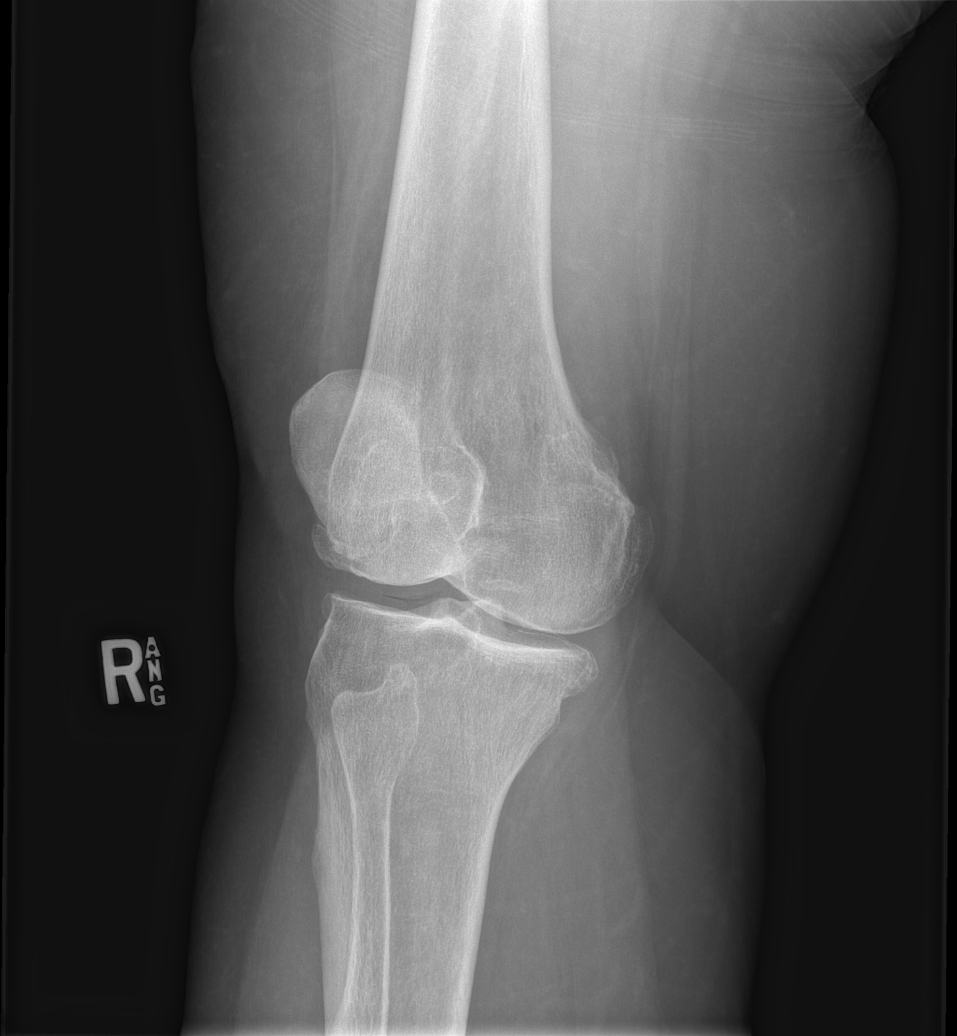

[t knee obl right (2 of 3)]
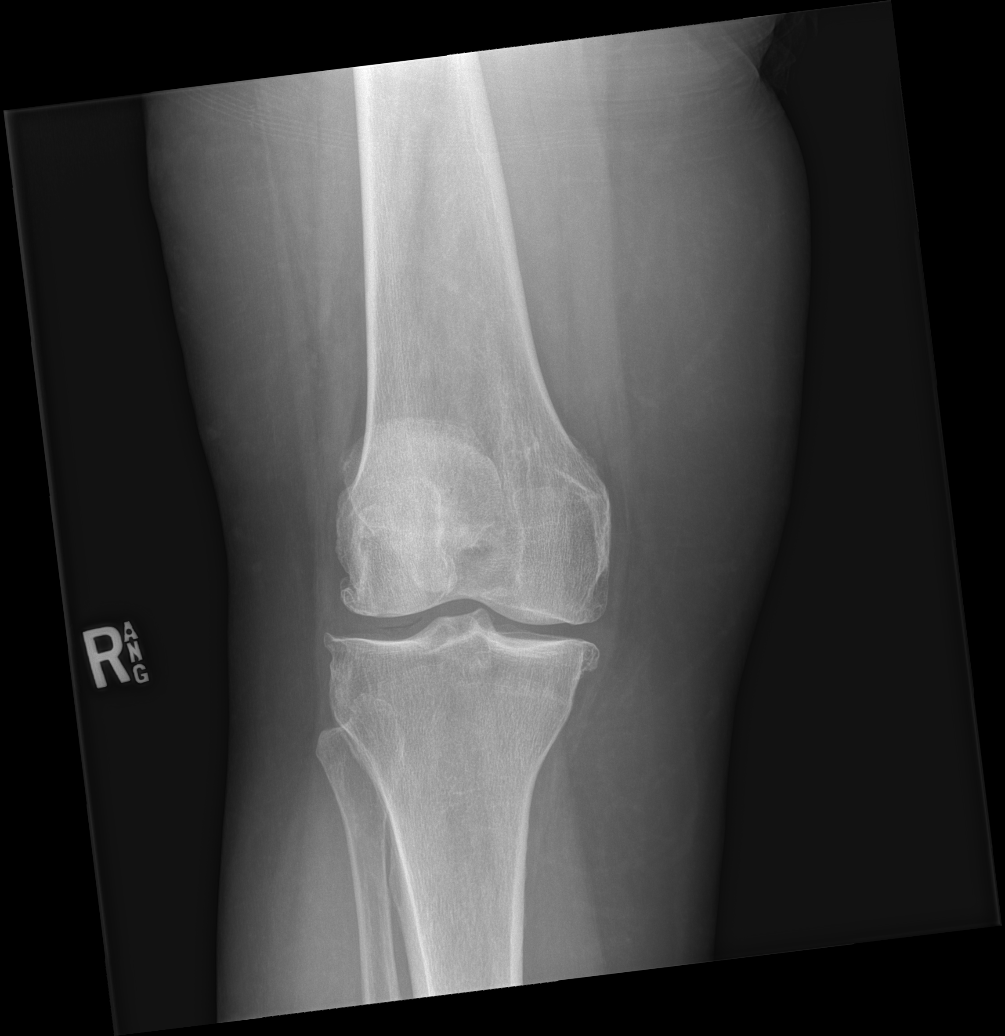

[t knee obl right (3 of 3)]
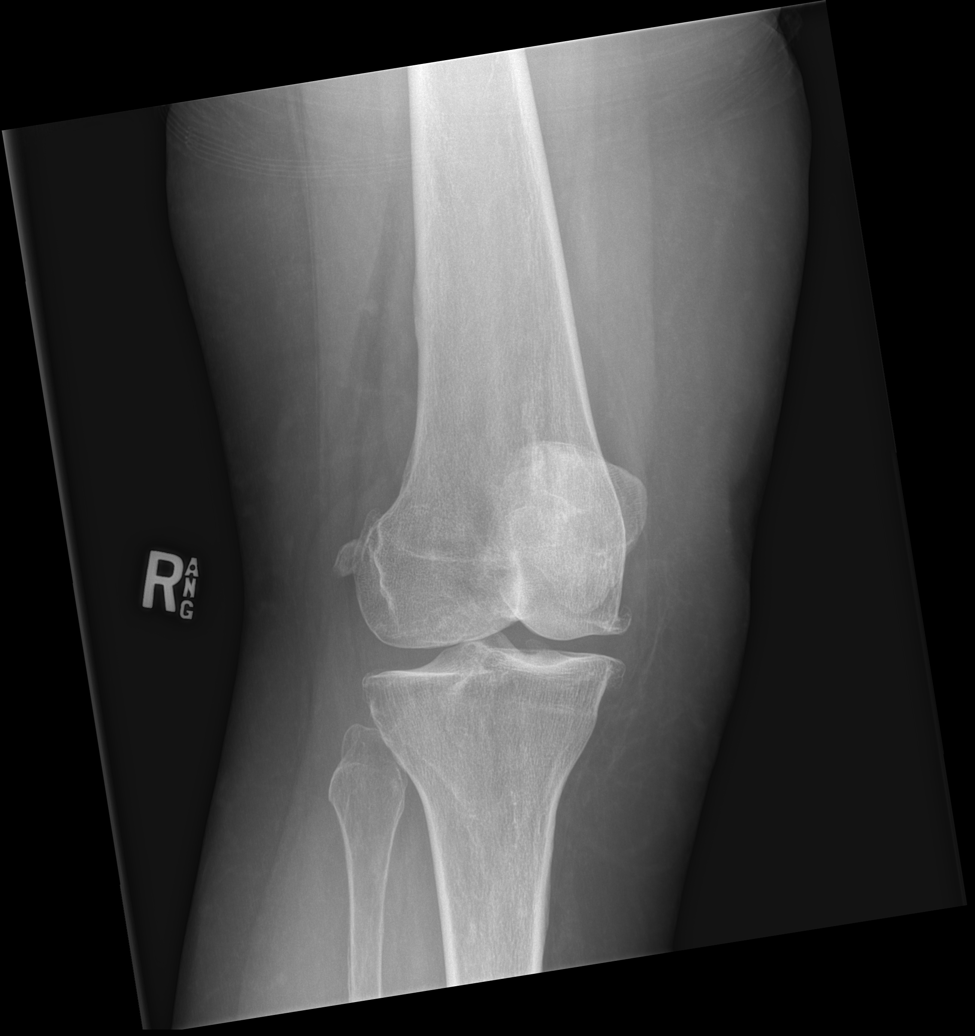

[t knee lat right]
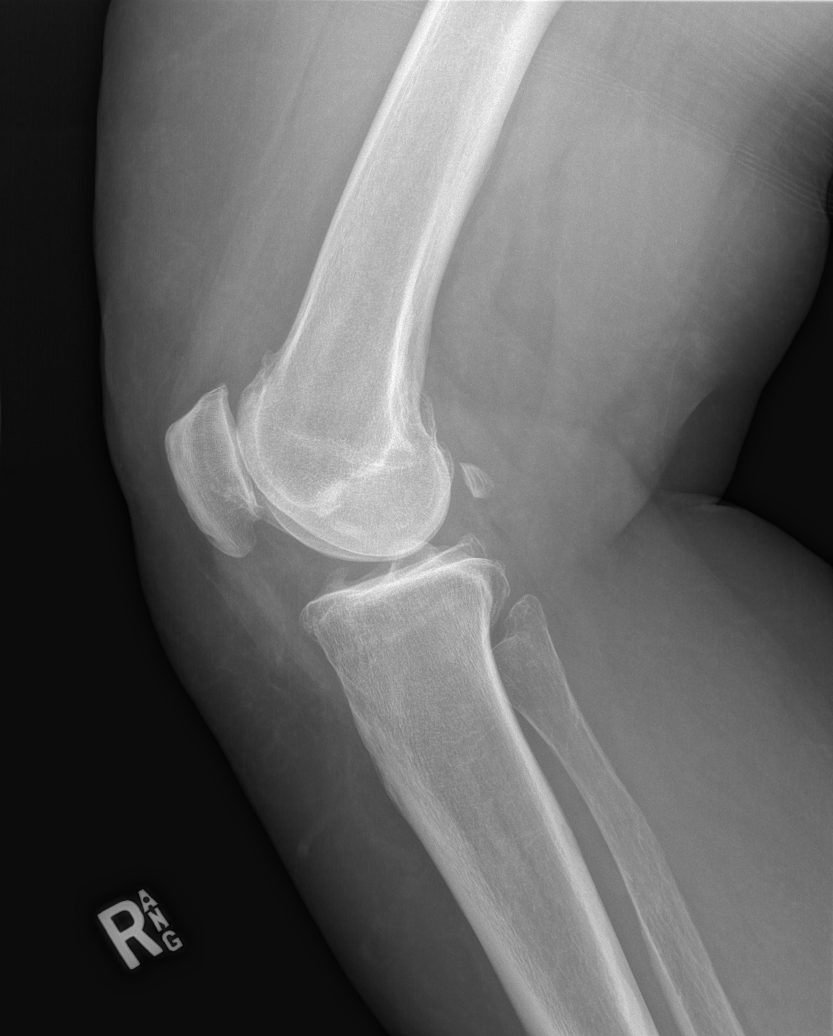

[4 of 4 positions shown; findings below may reference images not displayed]

FINDINGS: No fracture, dislocation, or effusion. Tricompartmental marginal
spurring. Mild narrowing of the articular cartilage in the medial
compartment.
IMPRESSION: 1. No fracture or other acute finding.
2. Tricompartmental DJD most marked medially.

## 2022-08-23 ENCOUNTER — Telehealth: Payer: Self-pay | Admitting: Hematology and Oncology

## 2022-08-23 ENCOUNTER — Ambulatory Visit: Payer: No Typology Code available for payment source | Admitting: Physician Assistant

## 2022-08-23 ENCOUNTER — Other Ambulatory Visit: Payer: No Typology Code available for payment source

## 2022-08-23 NOTE — Telephone Encounter (Signed)
Patient called to reschedule 3/12 appointments due to work conflict. Rescheduled patient and left voicemail with new appointment information.

## 2022-08-30 ENCOUNTER — Inpatient Hospital Stay: Payer: No Typology Code available for payment source | Admitting: Physician Assistant

## 2022-08-30 ENCOUNTER — Inpatient Hospital Stay: Payer: No Typology Code available for payment source | Attending: Hematology and Oncology

## 2022-09-04 ENCOUNTER — Other Ambulatory Visit: Payer: Self-pay | Admitting: Hematology and Oncology

## 2022-09-04 DIAGNOSIS — D45 Polycythemia vera: Secondary | ICD-10-CM

## 2022-09-04 NOTE — Progress Notes (Unsigned)
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 First CBC on record 2) 12/29/2017: WBC 14.1, Hgb 15.0, MCV 72.1, Plt 1074 3) 12/28/2018: WBC 10.9, Hgb 16.3, MCV 67.8, Hgb 982 4) 10/25/2019: WBC 9.5, Hgb 15.8, MCV 67.9, Plt 976 5) 12/19/2019: establish care with Dr. Lorenso Courier. JAK2 V617F mutation returned as positive. Patient already on ASA 81mg  PO daily.  6) 01/16/2020: bone marrow biopsy confirms hypercellular marrow consistent with polycythemia vera. Started hydroxyurea 500 mg PO daily.  7) Transferred care to Dr. [name] at [facility]. 8) 06/28/2022: transition care back to Dr. Lorenso Courier at [place] History:  Connie Myers 62 y.o. female with medical history significant for JAK2 positive PV who presents for a follow up visit. The patient's last visit was on 06/28/2021. In the interim since the last visit she has continued treatment with hydroxyurea.  On exam today Connie Myers notes ***  She denies having any issues with bleeding, bruising, or dark stools.  A full 10 point ROS is listed below.   MEDICAL HISTORY:  Past Medical History:  Diagnosis Date   Allergic rhinitis    Anemia    Arthritis    BMI 35.0-35.9,adult    BMI 123XX123    Complication of anesthesia    " I don't tolerate the caine's (Novacaine, Lidocaine etc. well " also, no codiene.    GERD (gastroesophageal reflux disease)    Hammertoe of right foot    Headache    Hypertension    Plantar wart, right foot    Polycythemia vera ([place])    Severe obesity (HCC)    Situational stress    Sleep apnea    PMH: " I don't have it now "   Thrombocytosis    Ventral hernia    Wears  glasses     SURGICAL HISTORY: Past Surgical History:  Procedure Laterality Date   CESAREAN SECTION     CHOLECYSTECTOMY     COLONOSCOPY     HERNIA REPAIR     INSERTION OF MESH N/A 12/05/2017   Procedure: INSERTION OF MESH;  Surgeon: Donnie Mesa, MD;  Location: [place];  Service: General;  Laterality: N/A;   VENTRAL HERNIA REPAIR  12/05/2017   OPEN VENTRAL HERNIA REPAIR ERAS PATHWAY w/mesh   VENTRAL HERNIA REPAIR N/A 12/05/2017   Procedure: [place];  Surgeon: Donnie Mesa, MD;  Location: [place];  Service: General;  Laterality: N/A;   VENTRAL HERNIA REPAIR  01/01/2019   VENTRAL HERNIA REPAIR N/A 01/01/2019   Procedure: LAPAROSCOPIC VENTRAL HERNIA REPAIR WITH MESH;  Surgeon: Donnie Mesa, MD;  Location: [place];  Service: General;  Laterality: N/A;   WISDOM TOOTH EXTRACTION     WOUND EXPLORATION N/A 12/29/2017   Procedure: ABDOMINAL WOUND EXPLORATION;  Surgeon: Donnie Mesa, MD;  Location: [place];  Service: General;  Laterality: N/A;  LMA    SOCIAL HISTORY: Social History   Socioeconomic History   Marital status: Married    Spouse name: Not on file   Number of children: Not on file   Years of education: Not on file   Highest education level: Not on file  Occupational History   Not on  file  Tobacco Use   Smoking status: Never   Smokeless tobacco: Never  Vaping Use   Vaping Use: Never used  Substance and Sexual Activity   Alcohol use: Yes    Comment: occasionally   Drug use: Not Currently   Sexual activity: Not on file  Other Topics Concern   Not on file  Social History Narrative   Not on file   Social Determinants of Health   Financial Resource Strain: Not on file  Food Insecurity: Not on file  Transportation Needs: Not on file  Physical Activity: Not on file  Stress: Not on file  Social Connections: Not on file  Intimate Partner Violence: Not on file    FAMILY HISTORY: Family History  Problem Relation Age of Onset   Heart  attack Mother    Breast cancer Mother    Rashes / Skin problems Father    Lupus Sister     ALLERGIES:  is allergic to codeine and local [lidocaine].  MEDICATIONS:  Current Outpatient Medications  Medication Sig Dispense Refill   hydroxyurea (HYDREA) 500 MG capsule Take 1 capsule (500 mg total) by mouth 2 (two) times daily. May take with food to minimize GI side effects. 180 capsule 2   ibuprofen (ADVIL) 200 MG tablet Take 600 mg by mouth every 6 (six) hours as needed.     No current facility-administered medications for this visit.    REVIEW OF SYSTEMS:   Constitutional: ( - ) fevers, ( - )  chills , ( - ) night sweats Eyes: ( - ) blurriness of vision, ( - ) double vision, ( - ) watery eyes Ears, nose, mouth, throat, and face: ( - ) mucositis, ( - ) sore throat Respiratory: ( - ) cough, ( - ) dyspnea, ( - ) wheezes Cardiovascular: ( - ) palpitation, ( - ) chest discomfort, ( - ) lower extremity swelling Gastrointestinal:  ( - ) nausea, ( - ) heartburn, ( - ) change in bowel habits Skin: ( - ) abnormal skin rashes Lymphatics: ( - ) new lymphadenopathy, ( - ) easy bruising Neurological: ( - ) numbness, ( - ) tingling, ( - ) new weaknesses Behavioral/Psych: ( - ) mood change, ( - ) new changes  All other systems were reviewed with the patient and are negative.  PHYSICAL EXAMINATION: ECOG PERFORMANCE STATUS: 0 - Asymptomatic  There were no vitals filed for this visit.  There were no vitals filed for this visit.   GENERAL: well appearing middle aged Caucasian female. alert, no distress and comfortable SKIN: skin color, texture, turgor are normal, no rashes or significant lesions EYES: conjunctiva are pink and non-injected, sclera clear LUNGS: clear to auscultation and percussion with normal breathing effort HEART: regular rate & rhythm and no murmurs and no lower extremity edema Musculoskeletal: no cyanosis of digits and no clubbing  PSYCH: alert & oriented x 3, fluent  speech NEURO: no focal motor/sensory deficits  LABORATORY DATA:  I have reviewed the data as listed    Latest Ref Rng & Units 07/26/2022    3:28 PM 07/12/2022    3:33 PM 06/28/2022   10:56 AM  CBC  WBC 4.0 - 10.5 K/uL 10.5  11.0  9.7   Hemoglobin 12.0 - 15.0 g/dL 14.8  14.4  14.6   Hematocrit 36.0 - 46.0 % 46.5  45.8  44.8   Platelets 150 - 400 K/uL 400  443  396        Latest Ref Rng &  Units 07/26/2022    3:28 PM 07/12/2022    3:33 PM 06/28/2022   10:56 AM  CMP  Glucose 70 - 99 mg/dL 105  96  92   BUN 8 - 23 mg/dL 21  24  23    Creatinine 0.44 - 1.00 mg/dL 0.65  0.63  0.70   Sodium 135 - 145 mmol/L 138  137  139   Potassium 3.5 - 5.1 mmol/L 4.7  4.2  3.9   Chloride 98 - 111 mmol/L 105  105  106   CO2 22 - 32 mmol/L 27  26  28    Calcium 8.9 - 10.3 mg/dL 9.1  8.9  9.0   Total Protein 6.5 - 8.1 g/dL 7.0  7.0  7.5   Total Bilirubin 0.3 - 1.2 mg/dL 0.5  0.4  0.5   Alkaline Phos 38 - 126 U/L 79  86  90   AST 15 - 41 U/L 11  12  11    ALT 0 - 44 U/L 8  9  10      RADIOGRAPHIC STUDIES:  No results found.   ASSESSMENT & PLAN Connie Myers 62 y.o. female with medical history significant for JAK2 positive PV who presents for a follow up visit. After review of the labs, discussion with the patient and review of the prior records her findings are most consistent with polycythemia vera.  The findings show that the patient has iron deficiency, low EPO levels, and marked erythrocytosis and thrombocytosis.  It is most likely that the patient has polycythemia vera which is caused an overproduction of red blood cells resulting in decreased iron stores and subsequent development of thrombocytosis.  The treatments at this time are designed to decrease her risk of thrombosis.  As such we will continue her on 81 mg ASA daily and will add hydroxyurea 500 mg p.o. daily as cytoreductive therapy.  Additionally in order to drop her levels further we will have a target hematocrit of 42%.  For this we  will be performing phlebotomy to drop her levels to target.  Previously we discussed the risk of developing acute leukemia as well as myelofibrosis.  We will start phlebotomies as soon as we can and the hydroxyurea prescription has already been prescribed.  We will plan to see her back in approximately 1 month's time in order to evaluate how she is tolerating therapy.  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 --avoid iron supplementation/iron rich foods as our goal is to decrease the bodies iron supply. --discussed risk for development of acute leukemia/myelofibrosis --RTC in 2 months with interval labs at 2 weeks in 4 weeks.  No orders of the defined types were placed in this encounter.   All questions were answered. The patient knows to call the clinic with any problems, questions or concerns.  A total of more than 30 minutes were spent on this encounter and over half of that time was spent on counseling and coordination of care as outlined above.   [name], MD Department of Hematology/Oncology [place] at [facility] Phone: [phone] Pager: [phone] Email: [email or web address]  09/04/2022 4:00 PM

## 2022-09-05 ENCOUNTER — Inpatient Hospital Stay (HOSPITAL_BASED_OUTPATIENT_CLINIC_OR_DEPARTMENT_OTHER): Payer: No Typology Code available for payment source | Admitting: Hematology and Oncology

## 2022-09-05 ENCOUNTER — Inpatient Hospital Stay: Payer: No Typology Code available for payment source | Attending: Hematology and Oncology

## 2022-09-05 ENCOUNTER — Other Ambulatory Visit: Payer: Self-pay

## 2022-09-05 VITALS — BP 146/70 | HR 63 | Temp 97.8°F | Resp 18 | Ht 69.0 in | Wt 233.6 lb

## 2022-09-05 DIAGNOSIS — D45 Polycythemia vera: Secondary | ICD-10-CM | POA: Insufficient documentation

## 2022-09-05 DIAGNOSIS — Z7982 Long term (current) use of aspirin: Secondary | ICD-10-CM | POA: Diagnosis not present

## 2022-09-05 DIAGNOSIS — D500 Iron deficiency anemia secondary to blood loss (chronic): Secondary | ICD-10-CM | POA: Diagnosis not present

## 2022-09-05 DIAGNOSIS — D75838 Other thrombocytosis: Secondary | ICD-10-CM | POA: Diagnosis not present

## 2022-09-05 DIAGNOSIS — E611 Iron deficiency: Secondary | ICD-10-CM | POA: Insufficient documentation

## 2022-09-05 DIAGNOSIS — Z1589 Genetic susceptibility to other disease: Secondary | ICD-10-CM

## 2022-09-05 LAB — CMP (CANCER CENTER ONLY)
ALT: 11 U/L (ref 0–44)
AST: 12 U/L — ABNORMAL LOW (ref 15–41)
Albumin: 4.3 g/dL (ref 3.5–5.0)
Alkaline Phosphatase: 76 U/L (ref 38–126)
Anion gap: 6 (ref 5–15)
BUN: 15 mg/dL (ref 8–23)
CO2: 27 mmol/L (ref 22–32)
Calcium: 9 mg/dL (ref 8.9–10.3)
Chloride: 105 mmol/L (ref 98–111)
Creatinine: 0.56 mg/dL (ref 0.44–1.00)
GFR, Estimated: >60 mL/min (ref >60)
Glucose, Bld: 92 mg/dL (ref 70–99)
Potassium: 4 mmol/L (ref 3.5–5.1)
Sodium: 138 mmol/L (ref 135–145)
Total Bilirubin: 0.6 mg/dL (ref 0.3–1.2)
Total Protein: 7.3 g/dL (ref 6.5–8.1)

## 2022-09-05 LAB — CBC WITH DIFFERENTIAL (CANCER CENTER ONLY)
Abs Immature Granulocytes: 0.08 10*3/uL — ABNORMAL HIGH (ref 0.00–0.07)
Basophils Absolute: 0.2 10*3/uL — ABNORMAL HIGH (ref 0.0–0.1)
Basophils Relative: 2 %
Eosinophils Absolute: 0.5 10*3/uL (ref 0.0–0.5)
Eosinophils Relative: 5 %
HCT: 47.3 % — ABNORMAL HIGH (ref 36.0–46.0)
Hemoglobin: 15.1 g/dL — ABNORMAL HIGH (ref 12.0–15.0)
Immature Granulocytes: 1 %
Lymphocytes Relative: 20 %
Lymphs Abs: 2 10*3/uL (ref 0.7–4.0)
MCH: 22.7 pg — ABNORMAL LOW (ref 26.0–34.0)
MCHC: 31.9 g/dL (ref 30.0–36.0)
MCV: 71 fL — ABNORMAL LOW (ref 80.0–100.0)
Monocytes Absolute: 0.9 10*3/uL (ref 0.1–1.0)
Monocytes Relative: 9 %
Neutro Abs: 6 10*3/uL (ref 1.7–7.7)
Neutrophils Relative %: 63 %
Platelet Count: 488 10*3/uL — ABNORMAL HIGH (ref 150–400)
RBC: 6.66 MIL/uL — ABNORMAL HIGH (ref 3.87–5.11)
RDW: 18.8 % — ABNORMAL HIGH (ref 11.5–15.5)
WBC Count: 9.6 10*3/uL (ref 4.0–10.5)
nRBC: 0 % (ref 0.0–0.2)

## 2022-09-05 LAB — IRON AND IRON BINDING CAPACITY (CC-WL,HP ONLY)
Iron: 15 ug/dL — ABNORMAL LOW (ref 28–170)
Saturation Ratios: 4 % — ABNORMAL LOW (ref 10.4–31.8)
TIBC: 410 ug/dL (ref 250–450)
UIBC: 395 ug/dL (ref 148–442)

## 2022-09-05 LAB — RETIC PANEL
Immature Retic Fract: 15.2 % (ref 2.3–15.9)
RBC.: 6.59 MIL/uL — ABNORMAL HIGH (ref 3.87–5.11)
Retic Count, Absolute: 85.7 10*3/uL (ref 19.0–186.0)
Retic Ct Pct: 1.3 % (ref 0.4–3.1)
Reticulocyte Hemoglobin: 26.1 pg — ABNORMAL LOW (ref >27.9)

## 2022-09-05 LAB — FERRITIN: Ferritin: 9 ng/mL — ABNORMAL LOW (ref 11–307)

## 2022-09-07 ENCOUNTER — Other Ambulatory Visit: Payer: Self-pay

## 2022-09-07 ENCOUNTER — Inpatient Hospital Stay: Payer: No Typology Code available for payment source

## 2022-09-07 VITALS — BP 144/78 | HR 68 | Resp 18

## 2022-09-07 DIAGNOSIS — D45 Polycythemia vera: Secondary | ICD-10-CM | POA: Diagnosis not present

## 2022-09-07 NOTE — Progress Notes (Signed)
[name] presents today for phlebotomy per MD orders. BP elevated at start of phlebotomy, pt denies symptoms.  Phlebotomy procedure started at 1533 and ended at 1543. 509 cc removed via 18G in L AC.  Patient tolerated procedure well. Provided snack and refreshment.  IV needle removed intact.  Observed for 30 minutes post phlebotomy. Patient complained of dizziness after standing. Advised patient to stay for additional 15 minute observation. Refreshments given. Additional set of vital signs obtained, see chart. Patient states dizziness subsided. Ambulated independently to lobby.

## 2022-09-07 NOTE — Patient Instructions (Signed)

## 2022-09-21 ENCOUNTER — Other Ambulatory Visit: Payer: Self-pay | Admitting: *Deleted

## 2022-09-21 ENCOUNTER — Other Ambulatory Visit: Payer: No Typology Code available for payment source

## 2022-09-21 ENCOUNTER — Inpatient Hospital Stay: Payer: No Typology Code available for payment source | Attending: Hematology and Oncology

## 2022-09-21 ENCOUNTER — Inpatient Hospital Stay: Payer: No Typology Code available for payment source

## 2022-09-21 DIAGNOSIS — D509 Iron deficiency anemia, unspecified: Secondary | ICD-10-CM | POA: Diagnosis not present

## 2022-09-21 DIAGNOSIS — D45 Polycythemia vera: Secondary | ICD-10-CM

## 2022-09-21 LAB — CBC WITH DIFFERENTIAL (CANCER CENTER ONLY)
Abs Immature Granulocytes: 0.03 10*3/uL (ref 0.00–0.07)
Basophils Absolute: 0.2 10*3/uL — ABNORMAL HIGH (ref 0.0–0.1)
Basophils Relative: 3 %
Eosinophils Absolute: 0.4 10*3/uL (ref 0.0–0.5)
Eosinophils Relative: 5 %
HCT: 43.9 % (ref 36.0–46.0)
Hemoglobin: 14.3 g/dL (ref 12.0–15.0)
Immature Granulocytes: 0 %
Lymphocytes Relative: 19 %
Lymphs Abs: 1.4 10*3/uL (ref 0.7–4.0)
MCH: 22.8 pg — ABNORMAL LOW (ref 26.0–34.0)
MCHC: 32.6 g/dL (ref 30.0–36.0)
MCV: 70 fL — ABNORMAL LOW (ref 80.0–100.0)
Monocytes Absolute: 0.8 10*3/uL (ref 0.1–1.0)
Monocytes Relative: 10 %
Neutro Abs: 4.9 10*3/uL (ref 1.7–7.7)
Neutrophils Relative %: 63 %
Platelet Count: 422 10*3/uL — ABNORMAL HIGH (ref 150–400)
RBC: 6.27 MIL/uL — ABNORMAL HIGH (ref 3.87–5.11)
RDW: 18.6 % — ABNORMAL HIGH (ref 11.5–15.5)
WBC Count: 7.8 10*3/uL (ref 4.0–10.5)
nRBC: 0 % (ref 0.0–0.2)

## 2022-09-21 LAB — FERRITIN: Ferritin: 7 ng/mL — ABNORMAL LOW (ref 11–307)

## 2022-09-21 NOTE — Progress Notes (Signed)
Connie Myers presents today for phlebotomy per MD orders. Phlebotomy procedure started at 1222 and ended at 1228. 500 grams removed. IV removed intact Patient observed for 30 minutes after procedure without any incident. Patient tolerated procedure well.  Food and drink provided.  Pt d/c to lobby in stable condition

## 2022-09-21 NOTE — Patient Instructions (Signed)

## 2022-10-05 ENCOUNTER — Other Ambulatory Visit: Payer: Self-pay

## 2022-10-05 ENCOUNTER — Inpatient Hospital Stay: Payer: No Typology Code available for payment source

## 2022-10-05 DIAGNOSIS — D45 Polycythemia vera: Secondary | ICD-10-CM | POA: Diagnosis not present

## 2022-10-05 DIAGNOSIS — D500 Iron deficiency anemia secondary to blood loss (chronic): Secondary | ICD-10-CM

## 2022-10-05 LAB — CBC WITH DIFFERENTIAL (CANCER CENTER ONLY)
Abs Immature Granulocytes: 0.05 10*3/uL (ref 0.00–0.07)
Basophils Absolute: 0.2 10*3/uL — ABNORMAL HIGH (ref 0.0–0.1)
Basophils Relative: 2 %
Eosinophils Absolute: 0.4 10*3/uL (ref 0.0–0.5)
Eosinophils Relative: 6 %
HCT: 42.2 % (ref 36.0–46.0)
Hemoglobin: 13.4 g/dL (ref 12.0–15.0)
Immature Granulocytes: 1 %
Lymphocytes Relative: 20 %
Lymphs Abs: 1.5 10*3/uL (ref 0.7–4.0)
MCH: 22.6 pg — ABNORMAL LOW (ref 26.0–34.0)
MCHC: 31.8 g/dL (ref 30.0–36.0)
MCV: 71 fL — ABNORMAL LOW (ref 80.0–100.0)
Monocytes Absolute: 0.7 10*3/uL (ref 0.1–1.0)
Monocytes Relative: 10 %
Neutro Abs: 4.6 10*3/uL (ref 1.7–7.7)
Neutrophils Relative %: 61 %
Platelet Count: 369 10*3/uL (ref 150–400)
RBC: 5.94 MIL/uL — ABNORMAL HIGH (ref 3.87–5.11)
RDW: 17.8 % — ABNORMAL HIGH (ref 11.5–15.5)
WBC Count: 7.6 10*3/uL (ref 4.0–10.5)
nRBC: 0 % (ref 0.0–0.2)

## 2022-10-05 LAB — FERRITIN: Ferritin: 7 ng/mL — ABNORMAL LOW (ref 11–307)

## 2022-10-05 NOTE — Progress Notes (Signed)
Patient her for therapeutic phlebotomy. Patients goal per provider is Hct less than 42. HCT today is 42.2 patient does not want to get phleb. Spoke with Dr. Derek [place] Nurse ok for her not to receive today. Patient notified copy of labs given to patient.

## 2022-10-11 ENCOUNTER — Encounter: Payer: Self-pay | Admitting: Physician Assistant

## 2022-10-24 ENCOUNTER — Other Ambulatory Visit: Payer: Self-pay | Admitting: Physician Assistant

## 2022-10-24 DIAGNOSIS — D45 Polycythemia vera: Secondary | ICD-10-CM

## 2022-10-25 ENCOUNTER — Inpatient Hospital Stay: Payer: No Typology Code available for payment source | Attending: Hematology and Oncology

## 2022-10-25 ENCOUNTER — Other Ambulatory Visit: Payer: Self-pay

## 2022-10-25 ENCOUNTER — Inpatient Hospital Stay: Payer: No Typology Code available for payment source

## 2022-10-25 ENCOUNTER — Inpatient Hospital Stay (HOSPITAL_BASED_OUTPATIENT_CLINIC_OR_DEPARTMENT_OTHER): Payer: No Typology Code available for payment source | Admitting: Physician Assistant

## 2022-10-25 DIAGNOSIS — Z803 Family history of malignant neoplasm of breast: Secondary | ICD-10-CM | POA: Diagnosis not present

## 2022-10-25 DIAGNOSIS — D45 Polycythemia vera: Secondary | ICD-10-CM

## 2022-10-25 DIAGNOSIS — D75838 Other thrombocytosis: Secondary | ICD-10-CM | POA: Diagnosis not present

## 2022-10-25 DIAGNOSIS — Z7982 Long term (current) use of aspirin: Secondary | ICD-10-CM | POA: Diagnosis not present

## 2022-10-25 DIAGNOSIS — E611 Iron deficiency: Secondary | ICD-10-CM | POA: Diagnosis not present

## 2022-10-25 LAB — FERRITIN: Ferritin: 5 ng/mL — ABNORMAL LOW (ref 11–307)

## 2022-10-25 LAB — CBC WITH DIFFERENTIAL (CANCER CENTER ONLY)
Abs Immature Granulocytes: 0.07 10*3/uL (ref 0.00–0.07)
Basophils Absolute: 0.2 10*3/uL — ABNORMAL HIGH (ref 0.0–0.1)
Basophils Relative: 3 %
Eosinophils Absolute: 0.4 10*3/uL (ref 0.0–0.5)
Eosinophils Relative: 5 %
HCT: 42.8 % (ref 36.0–46.0)
Hemoglobin: 13.6 g/dL (ref 12.0–15.0)
Immature Granulocytes: 1 %
Lymphocytes Relative: 18 %
Lymphs Abs: 1.4 10*3/uL (ref 0.7–4.0)
MCH: 22.4 pg — ABNORMAL LOW (ref 26.0–34.0)
MCHC: 31.8 g/dL (ref 30.0–36.0)
MCV: 70.4 fL — ABNORMAL LOW (ref 80.0–100.0)
Monocytes Absolute: 0.7 10*3/uL (ref 0.1–1.0)
Monocytes Relative: 9 %
Neutro Abs: 5.1 10*3/uL (ref 1.7–7.7)
Neutrophils Relative %: 64 %
Platelet Count: 340 10*3/uL (ref 150–400)
RBC: 6.08 MIL/uL — ABNORMAL HIGH (ref 3.87–5.11)
RDW: 17.4 % — ABNORMAL HIGH (ref 11.5–15.5)
WBC Count: 7.9 10*3/uL (ref 4.0–10.5)
nRBC: 0 % (ref 0.0–0.2)

## 2022-10-25 NOTE — Progress Notes (Signed)
Patient tolerated phlebotomy well. Started at 10:08 and ended at 10:18. Remained for 30 minute post observation.

## 2022-10-25 NOTE — Patient Instructions (Signed)

## 2022-10-26 ENCOUNTER — Encounter: Payer: Self-pay | Admitting: Hematology and Oncology

## 2022-10-26 NOTE — Progress Notes (Signed)
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 First CBC on record 2) 12/29/2017: WBC 14.1, Hgb 15.0, MCV 72.1, Plt 1074 3) 12/28/2018: WBC 10.9, Hgb 16.3, MCV 67.8, Hgb 982 4) 10/25/2019: WBC 9.5, Hgb 15.8, MCV 67.9, Plt 976 5) 12/19/2019: establish care with Dr. Leonides Schanz. JAK2 V617F mutation returned as positive. Patient already on ASA 81mg  PO daily.  6) 01/16/2020: bone marrow biopsy confirms hypercellular marrow consistent with polycythemia vera. Started hydroxyurea 500 mg PO daily.  7) Transferred care to Dr. Myna Hidalgo at [facility]. 8) 06/28/2022: transition care back to Dr. Leonides Schanz at [facility]    Interval History:  Connie Myers 62 y.o. female with medical history significant for JAK2 positive PV who presents for a follow up visit. The patient's last visit was on 09/04/2021. In the interim since the last visit she has continued treatment with hydroxyurea BID.   On exam today Connie Myers notes she takes hydroxyurea 5 mg twice daily. Her energy is fairly stable but gets fatigued with working so many days in a row. She reports her appetite and weight are stable. She denies nausea, vomiting or abdominal pain. Her bowel habits are unchanged. She denies easy bruising or signs of overt bleeding such as hematochezia or melena. She does have more sweats that occur sporadically throughout the day. She adds that she continues to notice hair thinning. She denies fevers, chills, sweats, shortness of breath, chest pain, headaches or vision changes. She has no other complaints.  A full 10 point ROS is listed below.  MEDICAL HISTORY:  Past Medical History:  Diagnosis Date    Allergic rhinitis    Anemia    Arthritis    BMI 35.0-35.9,adult    BMI 36.0-36.9,adult    Complication of anesthesia    " I don't tolerate the caine's (Novacaine, Lidocaine etc. well " also, no codiene.    GERD (gastroesophageal reflux disease)    Hammertoe of right foot    Headache    Hypertension    Plantar wart, right foot    Polycythemia vera (HCC)    Severe obesity (HCC)    Situational stress    Sleep apnea    PMH: " I don't have it now "   Thrombocytosis    Ventral hernia    Wears glasses     SURGICAL HISTORY: Past Surgical History:  Procedure Laterality Date   CESAREAN SECTION     CHOLECYSTECTOMY     COLONOSCOPY     HERNIA REPAIR     INSERTION OF MESH N/A 12/05/2017   Procedure: INSERTION OF MESH;  Surgeon: Manus Rudd, MD;  Location: [facility] OR;  Service: General;  Laterality: N/A;   VENTRAL HERNIA REPAIR  12/05/2017   OPEN VENTRAL HERNIA REPAIR ERAS PATHWAY w/mesh   VENTRAL HERNIA REPAIR N/A 12/05/2017   Procedure: OPEN VENTRAL HERNIA REPAIR ERAS PATHWAY;  Surgeon: Manus Rudd, MD;  Location[facility] OR;  Service: General;  Laterality: N/A;   VENTRAL HERNIA REPAIR  01/01/2019   VENTRAL HERNIA REPAIR N/A 01/01/2019   Procedure: LAPAROSCOPIC VENTRAL HERNIA REPAIR WITH MESH;  Surgeon: Manus Rudd, MD;  Location: [facility] OR;  Service: General;  Laterality: N/A;   WISDOM TOOTH EXTRACTION  WOUND EXPLORATION N/A 12/29/2017   Procedure: ABDOMINAL WOUND EXPLORATION;  Surgeon: Manus Rudd, MD;  Location: [facility] OR;  Service: General;  Laterality: N/A;  LMA    SOCIAL HISTORY: Social History   Socioeconomic History   Marital status: Married    Spouse name: Not on file   Number of children: Not on file   Years of education: Not on file   Highest education level: Not on file  Occupational History   Not on file  Tobacco Use   Smoking status: Never   Smokeless tobacco: Never  Vaping Use   Vaping Use: Never used  Substance and Sexual Activity   Alcohol use: Yes     Comment: occasionally   Drug use: Not Currently   Sexual activity: Not on file  Other Topics Concern   Not on file  Social History Narrative   Not on file   Social Determinants of Health   Financial Resource Strain: Not on file  Food Insecurity: Not on file  Transportation Needs: Not on file  Physical Activity: Not on file  Stress: Not on file  Social Connections: Not on file  Intimate Partner Violence: Not on file    FAMILY HISTORY: Family History  Problem Relation Age of Onset   Heart attack Mother    Breast cancer Mother    Rashes / Skin problems Father    Lupus Sister     ALLERGIES:  is allergic to codeine and local [lidocaine].  MEDICATIONS:  Current Outpatient Medications  Medication Sig Dispense Refill   hydroxyurea (HYDREA) 500 MG capsule Take 1 capsule (500 mg total) by mouth 2 (two) times daily. May take with food to minimize GI side effects. 180 capsule 2   ibuprofen (ADVIL) 200 MG tablet Take 600 mg by mouth every 6 (six) hours as needed.     No current facility-administered medications for this visit.    REVIEW OF SYSTEMS:   Constitutional: ( - ) fevers, ( - )  chills , ( - ) night sweats Eyes: ( - ) blurriness of vision, ( - ) double vision, ( - ) watery eyes Ears, nose, mouth, throat, and face: ( - ) mucositis, ( - ) sore throat Respiratory: ( - ) cough, ( - ) dyspnea, ( - ) wheezes Cardiovascular: ( - ) palpitation, ( - ) chest discomfort, ( - ) lower extremity swelling Gastrointestinal:  ( - ) nausea, ( - ) heartburn, ( - ) change in bowel habits Skin: ( - ) abnormal skin rashes Lymphatics: ( - ) new lymphadenopathy, ( - ) easy bruising Neurological: ( - ) numbness, ( - ) tingling, ( - ) new weaknesses Behavioral/Psych: ( - ) mood change, ( - ) new changes  All other systems were reviewed with the patient and are negative.  PHYSICAL EXAMINATION: ECOG PERFORMANCE STATUS: 0 - Asymptomatic  There were no vitals filed for this visit.   There were  no vitals filed for this visit.   10/25/22  Temp 98 F (36.7 C)  Temp src Oral  Pulse 78  Resp 17  BP 146/76 !    GENERAL: well appearing middle aged Caucasian female. alert, no distress and comfortable SKIN: skin color, texture, turgor are normal, no rashes or significant lesions EYES: conjunctiva are pink and non-injected, sclera clear LUNGS: clear to auscultation and percussion with normal breathing effort HEART: regular rate & rhythm and no murmurs and no lower extremity edema Musculoskeletal: no cyanosis of digits and no clubbing  PSYCH:  alert & oriented x 3, fluent speech NEURO: no focal motor/sensory deficits  LABORATORY DATA:  I have reviewed the data as listed    Latest Ref Rng & Units 10/25/2022    9:18 AM 10/05/2022   11:10 AM 09/21/2022   11:15 AM  CBC  WBC 4.0 - 10.5 K/uL 7.9  7.6  7.8   Hemoglobin 12.0 - 15.0 g/dL 16.1  09.6  04.5   Hematocrit 36.0 - 46.0 % 42.8  42.2  43.9   Platelets 150 - 400 K/uL 340  369  422        Latest Ref Rng & Units 09/05/2022    2:01 PM 07/26/2022    3:28 PM 07/12/2022    3:33 PM  CMP  Glucose 70 - 99 mg/dL 92  409  96   BUN 8 - 23 mg/dL 15  21  24    Creatinine 0.44 - 1.00 mg/dL 8.11  9.14  7.82   Sodium 135 - 145 mmol/L 138  138  137   Potassium 3.5 - 5.1 mmol/L 4.0  4.7  4.2   Chloride 98 - 111 mmol/L 105  105  105   CO2 22 - 32 mmol/L 27  27  26    Calcium 8.9 - 10.3 mg/dL 9.0  9.1  8.9   Total Protein 6.5 - 8.1 g/dL 7.3  7.0  7.0   Total Bilirubin 0.3 - 1.2 mg/dL 0.6  0.5  0.4   Alkaline Phos 38 - 126 U/L 76  79  86   AST 15 - 41 U/L 12  11  12    ALT 0 - 44 U/L 11  8  9      RADIOGRAPHIC STUDIES:  No results found.   ASSESSMENT & PLAN ALDORA DONARSKI 62 y.o. female with medical history significant for JAK2 positive PV who presents for a follow up visit. After review of the labs, discussion with the patient and review of the prior records her findings are most consistent with polycythemia vera.  The findings show  that the patient has iron deficiency, low EPO levels, and marked erythrocytosis and thrombocytosis.  It is most likely that the patient has polycythemia vera which is caused an overproduction of red blood cells resulting in decreased iron stores and subsequent development of thrombocytosis.  The treatments at this time are designed to decrease her risk of thrombosis.  As such we will continue her on 81 mg ASA daily and will add hydroxyurea 500 mg p.o. daily as cytoreductive therapy.  Additionally in order to drop her levels further we will have a target hematocrit of 42%.  For this we will be performing phlebotomy to drop her levels to target.  Previously we discussed the risk of developing acute leukemia as well as myelofibrosis.  We will start phlebotomies as soon as we can and the hydroxyurea prescription has already been prescribed.  We will plan to see her back in approximately 1 month's time in order to evaluate how she is tolerating therapy.  # Polycythemia Vera, JAK2 Positive # Thrombocytosis 2/2 to Iron Deficiency --continue ASA 81mg  PO daily for thromboprophylaxis --continue hydroxyurea 500mg  PO BID for cytoreductive therapy --target HCT 42% (today 42.8%). proceed with phlebotomy today.  --avoid iron supplementation/iron rich foods as our goal is to decrease the bodies iron supply. --discussed risk for development of acute leukemia/myelofibrosis --RTC in 2 months with interval q 2 week phlebotomies.   No orders of the defined types were placed in this encounter.  All  questions were answered. The patient knows to call the clinic with any problems, questions or concerns.  I have spent a total of 30 minutes minutes of face-to-face and non-face-to-face time, preparing to see the patient,performing a medically appropriate examination, counseling and educating the patient, documenting clinical information in the electronic health record,  and care coordination.   Georga Kaufmann PA-C Dept of  Hematology and Oncology [facility] at [facility] Phone: [phone]   10/26/2022 6:17 AM

## 2022-10-28 ENCOUNTER — Telehealth: Payer: Self-pay | Admitting: Physician Assistant

## 2022-11-08 ENCOUNTER — Other Ambulatory Visit: Payer: Self-pay

## 2022-11-08 ENCOUNTER — Inpatient Hospital Stay: Payer: No Typology Code available for payment source

## 2022-11-08 ENCOUNTER — Other Ambulatory Visit: Payer: Self-pay | Admitting: *Deleted

## 2022-11-08 DIAGNOSIS — D45 Polycythemia vera: Secondary | ICD-10-CM

## 2022-11-08 LAB — CBC WITH DIFFERENTIAL (CANCER CENTER ONLY)
Abs Immature Granulocytes: 0.15 10*3/uL — ABNORMAL HIGH (ref 0.00–0.07)
Basophils Absolute: 0.2 10*3/uL — ABNORMAL HIGH (ref 0.0–0.1)
Basophils Relative: 2 %
Eosinophils Absolute: 0.5 10*3/uL (ref 0.0–0.5)
Eosinophils Relative: 5 %
HCT: 39.7 % (ref 36.0–46.0)
Hemoglobin: 12.5 g/dL (ref 12.0–15.0)
Immature Granulocytes: 1 %
Lymphocytes Relative: 15 %
Lymphs Abs: 1.6 10*3/uL (ref 0.7–4.0)
MCH: 22.2 pg — ABNORMAL LOW (ref 26.0–34.0)
MCHC: 31.5 g/dL (ref 30.0–36.0)
MCV: 70.4 fL — ABNORMAL LOW (ref 80.0–100.0)
Monocytes Absolute: 1.1 10*3/uL — ABNORMAL HIGH (ref 0.1–1.0)
Monocytes Relative: 10 %
Neutro Abs: 7.1 10*3/uL (ref 1.7–7.7)
Neutrophils Relative %: 67 %
Platelet Count: 409 10*3/uL — ABNORMAL HIGH (ref 150–400)
RBC: 5.64 MIL/uL — ABNORMAL HIGH (ref 3.87–5.11)
RDW: 16.5 % — ABNORMAL HIGH (ref 11.5–15.5)
WBC Count: 10.7 10*3/uL — ABNORMAL HIGH (ref 4.0–10.5)
nRBC: 0 % (ref 0.0–0.2)

## 2022-11-08 LAB — FERRITIN: Ferritin: 6 ng/mL — ABNORMAL LOW (ref 11–307)

## 2022-11-08 NOTE — Progress Notes (Signed)
Per MD note, pt does not meet parameters for phlebotomy today. Pt informed of this and agreed to return at next scheduled appointment time.

## 2022-11-22 ENCOUNTER — Inpatient Hospital Stay: Payer: No Typology Code available for payment source | Attending: Hematology and Oncology

## 2022-11-22 ENCOUNTER — Other Ambulatory Visit: Payer: Self-pay | Admitting: Hematology and Oncology

## 2022-11-22 ENCOUNTER — Inpatient Hospital Stay: Payer: No Typology Code available for payment source

## 2022-11-22 ENCOUNTER — Other Ambulatory Visit: Payer: Self-pay | Admitting: *Deleted

## 2022-11-22 VITALS — BP 132/82 | HR 74 | Temp 98.0°F | Resp 17

## 2022-11-22 DIAGNOSIS — D45 Polycythemia vera: Secondary | ICD-10-CM | POA: Insufficient documentation

## 2022-11-22 LAB — CBC WITH DIFFERENTIAL (CANCER CENTER ONLY)
Abs Immature Granulocytes: 0.13 10*3/uL — ABNORMAL HIGH (ref 0.00–0.07)
Basophils Absolute: 0.2 10*3/uL — ABNORMAL HIGH (ref 0.0–0.1)
Basophils Relative: 2 %
Eosinophils Absolute: 0.6 10*3/uL — ABNORMAL HIGH (ref 0.0–0.5)
Eosinophils Relative: 6 %
HCT: 42.2 % (ref 36.0–46.0)
Hemoglobin: 13.1 g/dL (ref 12.0–15.0)
Immature Granulocytes: 1 %
Lymphocytes Relative: 17 %
Lymphs Abs: 1.8 10*3/uL (ref 0.7–4.0)
MCH: 21.7 pg — ABNORMAL LOW (ref 26.0–34.0)
MCHC: 31 g/dL (ref 30.0–36.0)
MCV: 69.8 fL — ABNORMAL LOW (ref 80.0–100.0)
Monocytes Absolute: 0.9 10*3/uL (ref 0.1–1.0)
Monocytes Relative: 9 %
Neutro Abs: 6.5 10*3/uL (ref 1.7–7.7)
Neutrophils Relative %: 65 %
Platelet Count: 459 10*3/uL — ABNORMAL HIGH (ref 150–400)
RBC: 6.05 MIL/uL — ABNORMAL HIGH (ref 3.87–5.11)
RDW: 17 % — ABNORMAL HIGH (ref 11.5–15.5)
WBC Count: 10.1 10*3/uL (ref 4.0–10.5)
nRBC: 0 % (ref 0.0–0.2)

## 2022-11-22 LAB — CMP (CANCER CENTER ONLY)
ALT: 10 U/L (ref 0–44)
AST: 11 U/L — ABNORMAL LOW (ref 15–41)
Albumin: 4.3 g/dL (ref 3.5–5.0)
Alkaline Phosphatase: 79 U/L (ref 38–126)
Anion gap: 6 (ref 5–15)
BUN: 20 mg/dL (ref 8–23)
CO2: 25 mmol/L (ref 22–32)
Calcium: 9 mg/dL (ref 8.9–10.3)
Chloride: 107 mmol/L (ref 98–111)
Creatinine: 0.54 mg/dL (ref 0.44–1.00)
GFR, Estimated: >60 mL/min (ref >60)
Glucose, Bld: 100 mg/dL — ABNORMAL HIGH (ref 70–99)
Potassium: 4.3 mmol/L (ref 3.5–5.1)
Sodium: 138 mmol/L (ref 135–145)
Total Bilirubin: 0.6 mg/dL (ref 0.3–1.2)
Total Protein: 7.4 g/dL (ref 6.5–8.1)

## 2022-11-22 LAB — FERRITIN: Ferritin: 6 ng/mL — ABNORMAL LOW (ref 11–307)

## 2022-11-22 NOTE — Progress Notes (Signed)
1610 Phlebotomy began using 16 gauze needle to L AC. 538 g of blood removed, ended at 0945. Pt tolerates well, expressing no c/o.

## 2022-11-22 NOTE — Patient Instructions (Signed)

## 2022-12-06 ENCOUNTER — Telehealth: Payer: Self-pay | Admitting: *Deleted

## 2022-12-06 ENCOUNTER — Other Ambulatory Visit: Payer: Self-pay

## 2022-12-06 ENCOUNTER — Inpatient Hospital Stay: Payer: No Typology Code available for payment source

## 2022-12-06 DIAGNOSIS — D45 Polycythemia vera: Secondary | ICD-10-CM | POA: Diagnosis not present

## 2022-12-06 LAB — COMPREHENSIVE METABOLIC PANEL
ALT: 8 U/L (ref 0–44)
AST: 10 U/L — ABNORMAL LOW (ref 15–41)
Albumin: 4 g/dL (ref 3.5–5.0)
Alkaline Phosphatase: 80 U/L (ref 38–126)
Anion gap: 7 (ref 5–15)
BUN: 16 mg/dL (ref 8–23)
CO2: 25 mmol/L (ref 22–32)
Calcium: 8.9 mg/dL (ref 8.9–10.3)
Chloride: 108 mmol/L (ref 98–111)
Creatinine, Ser: 0.49 mg/dL (ref 0.44–1.00)
GFR, Estimated: >60 mL/min (ref >60)
Glucose, Bld: 93 mg/dL (ref 70–99)
Potassium: 4 mmol/L (ref 3.5–5.1)
Sodium: 140 mmol/L (ref 135–145)
Total Bilirubin: 0.7 mg/dL (ref 0.3–1.2)
Total Protein: 6.7 g/dL (ref 6.5–8.1)

## 2022-12-06 LAB — CBC WITH DIFFERENTIAL/PLATELET
Abs Immature Granulocytes: 0.08 10*3/uL — ABNORMAL HIGH (ref 0.00–0.07)
Basophils Absolute: 0.2 10*3/uL — ABNORMAL HIGH (ref 0.0–0.1)
Basophils Relative: 2 %
Eosinophils Absolute: 0.5 10*3/uL (ref 0.0–0.5)
Eosinophils Relative: 6 %
HCT: 39.1 % (ref 36.0–46.0)
Hemoglobin: 12.4 g/dL (ref 12.0–15.0)
Immature Granulocytes: 1 %
Lymphocytes Relative: 17 %
Lymphs Abs: 1.5 10*3/uL (ref 0.7–4.0)
MCH: 22.2 pg — ABNORMAL LOW (ref 26.0–34.0)
MCHC: 31.7 g/dL (ref 30.0–36.0)
MCV: 70.1 fL — ABNORMAL LOW (ref 80.0–100.0)
Monocytes Absolute: 0.9 10*3/uL (ref 0.1–1.0)
Monocytes Relative: 10 %
Neutro Abs: 5.9 10*3/uL (ref 1.7–7.7)
Neutrophils Relative %: 64 %
Platelets: 362 10*3/uL (ref 150–400)
RBC: 5.58 MIL/uL — ABNORMAL HIGH (ref 3.87–5.11)
RDW: 16.2 % — ABNORMAL HIGH (ref 11.5–15.5)
WBC: 9.1 10*3/uL (ref 4.0–10.5)
nRBC: 0 % (ref 0.0–0.2)

## 2022-12-06 LAB — FERRITIN: Ferritin: 5 ng/mL — ABNORMAL LOW (ref 11–307)

## 2022-12-06 NOTE — Progress Notes (Signed)
Patient made aware of Hct of 39 and no need for phlebotomy since goal is Hct 42%. Patient provided a copy of the labs. She was very appreciative and aware of her upcoming appointments.

## 2022-12-06 NOTE — Telephone Encounter (Signed)
-----   Message from Jaci Standard, MD sent at 12/06/2022  4:05 PM EDT ----- Please let Mrs. Haltiwanger know that her hematocrit is on target, currently 39.1 (goal is >42). We will see her in clinic as scheduled on 12/19/2022.  ----- Message ----- From: Leory Plowman, Lab In [place] Sent: 12/06/2022   8:45 AM EDT To: Jaci Standard, MD

## 2022-12-06 NOTE — Telephone Encounter (Signed)
Notified of message below

## 2022-12-18 ENCOUNTER — Other Ambulatory Visit: Payer: Self-pay | Admitting: Hematology and Oncology

## 2022-12-18 DIAGNOSIS — D45 Polycythemia vera: Secondary | ICD-10-CM

## 2022-12-18 NOTE — Progress Notes (Unsigned)
[facility] Telephone:([phone]   Fax:(336) [phone]  PROGRESS NOTE  Patient Care Team: Tally Joe, MD as PCP - General (Family Medicine) Wendall Stade, MD as PCP - Cardiology (Cardiology)  Hematological/Oncological History # Polycythemia Vera, JAK2 Positive # Thrombocytosis 2/2 to Iron Deficiency 1) 08/14/2017: WBC 8.0, Hgb 16.2, Plt 713, MCV 71.9. First CBC on record 2) 12/29/2017: WBC 14.1, Hgb 15.0, MCV 72.1, Plt 1074 3) 12/28/2018: WBC 10.9, Hgb 16.3, MCV 67.8, Hgb 982 4) 10/25/2019: WBC 9.5, Hgb 15.8, MCV 67.9, Plt 976 5) 12/19/2019: establish care with Dr. Leonides Schanz. JAK2 V617F mutation returned as positive. Patient already on ASA 81mg  PO daily.  6) 01/16/2020: bone marrow biopsy confirms hypercellular marrow consistent with polycythemia vera. Started hydroxyurea 500 mg PO daily.  7) Transferred care to Dr. Myna Hidalgo at [facility]. 8) 06/28/2022: transition care back to Dr. Leonides Schanz at [facility]    Interval History:  Connie Myers 62 y.o. female with medical history significant for JAK2 positive PV who presents for a follow up visit. The patient's last visit was on 10/24/2021. In the interim since the last visit she has continued treatment with hydroxyurea BID.   On exam today Mrs. Steele notes she has been struggling with her hydroxyurea 5 mg twice daily.  She reports that she is "so tired" and is having frequent loose watery stools.  She was later visit today because of diarrhea.  She reports that she is also developed sores in her mouth and is having headaches.  She is having some numbness in her arms as well as well as "heat spikes".  She notes that she does work around [employer] and is having a lot of sweating.  She reports that she has not missed any doses of the hydroxyurea despite the symptoms.  She is disheartened today by the fact that her hematocrit remains above target.. She has no other complaints.  A full 10 point ROS is listed below.  Today we  discussed options moving forward including transitioning to [facility].  She notes she would be interested in making that transition.  MEDICAL HISTORY:  Past Medical History:  Diagnosis Date   Allergic rhinitis    Anemia    Arthritis    BMI 35.0-35.9,adult    BMI 36.0-36.9,adult    Complication of anesthesia    " I don't tolerate the caine's (Novacaine, Lidocaine etc. well " also, no codiene.    GERD (gastroesophageal reflux disease)    Hammertoe of right foot    Headache    Hypertension    Plantar wart, right foot    Polycythemia vera (HCC)    Severe obesity (HCC)    Situational stress    Sleep apnea    PMH: " I don't have it now "   Thrombocytosis    Ventral hernia    Wears glasses     SURGICAL HISTORY: Past Surgical History:  Procedure Laterality Date   CESAREAN SECTION     CHOLECYSTECTOMY     COLONOSCOPY     HERNIA REPAIR     INSERTION OF MESH N/A 12/05/2017   Procedure: INSERTION OF MESH;  Surgeon: Manus Rudd, MD;  Location: [facility] OR;  Service: General;  Laterality: N/A;   VENTRAL HERNIA REPAIR  12/05/2017   OPEN VENTRAL HERNIA REPAIR ERAS PATHWAY w/mesh   VENTRAL HERNIA REPAIR N/A 12/05/2017   Procedure: OPEN VENTRAL HERNIA REPAIR ERAS PATHWAY;  Surgeon: Manus Rudd, MD;  Location: [facility] OR;  Service: General;  Laterality: N/A;  VENTRAL HERNIA REPAIR  01/01/2019   VENTRAL HERNIA REPAIR N/A 01/01/2019   Procedure: LAPAROSCOPIC VENTRAL HERNIA REPAIR WITH MESH;  Surgeon: Manus Rudd, MD;  Location: [facility] OR;  Service: General;  Laterality: N/A;   WISDOM TOOTH EXTRACTION     WOUND EXPLORATION N/A 12/29/2017   Procedure: ABDOMINAL WOUND EXPLORATION;  Surgeon: Manus Rudd, MD;  Location: [facility] OR;  Service: General;  Laterality: N/A;  LMA    SOCIAL HISTORY: Social History   Socioeconomic History   Marital status: Married    Spouse name: Not on file   Number of children: Not on file   Years of education: Not on file   Highest education level: Not on file   Occupational History   Not on file  Tobacco Use   Smoking status: Never   Smokeless tobacco: Never  Vaping Use   Vaping Use: Never used  Substance and Sexual Activity   Alcohol use: Yes    Comment: occasionally   Drug use: Not Currently   Sexual activity: Not on file  Other Topics Concern   Not on file  Social History Narrative   Not on file   Social Determinants of Health   Financial Resource Strain: Not on file  Food Insecurity: Not on file  Transportation Needs: Not on file  Physical Activity: Not on file  Stress: Not on file  Social Connections: Not on file  Intimate Partner Violence: Not on file    FAMILY HISTORY: Family History  Problem Relation Age of Onset   Heart attack Mother    Breast cancer Mother    Rashes / Skin problems Father    Lupus Sister     ALLERGIES:  is allergic to codeine and local [lidocaine].  MEDICATIONS:  Current Outpatient Medications  Medication Sig Dispense Refill   ruxolitinib phosphate (JAKAFI) 10 MG tablet Take 1 tablet (10 mg total) by mouth 2 (two) times daily. 60 tablet 2   hydroxyurea (HYDREA) 500 MG capsule Take 1 capsule (500 mg total) by mouth 2 (two) times daily. May take with food to minimize GI side effects. 180 capsule 2   ibuprofen (ADVIL) 200 MG tablet Take 600 mg by mouth every 6 (six) hours as needed.     No current facility-administered medications for this visit.    REVIEW OF SYSTEMS:   Constitutional: ( - ) fevers, ( - )  chills , ( - ) night sweats Eyes: ( - ) blurriness of vision, ( - ) double vision, ( - ) watery eyes Ears, nose, mouth, throat, and face: ( - ) mucositis, ( - ) sore throat Respiratory: ( - ) cough, ( - ) dyspnea, ( - ) wheezes Cardiovascular: ( - ) palpitation, ( - ) chest discomfort, ( - ) lower extremity swelling Gastrointestinal:  ( - ) nausea, ( - ) heartburn, ( - ) change in bowel habits Skin: ( - ) abnormal skin rashes Lymphatics: ( - ) new lymphadenopathy, ( - ) easy  bruising Neurological: ( - ) numbness, ( - ) tingling, ( - ) new weaknesses Behavioral/Psych: ( - ) mood change, ( - ) new changes  All other systems were reviewed with the patient and are negative.  PHYSICAL EXAMINATION: ECOG PERFORMANCE STATUS: 0 - Asymptomatic  Vitals:   12/19/22 0859  BP: (!) 209/104  Pulse: 77  Resp: 15  Temp: 98 F (36.7 C)  SpO2: 92%     Filed Weights   12/19/22 0859  Weight: 229 lb (103.9 kg)  10/25/22  Temp 98 F (36.7 C)  Temp src Oral  Pulse 78  Resp 17  BP 146/76 !    GENERAL: well appearing middle aged Caucasian female. alert, no distress and comfortable SKIN: skin color, texture, turgor are normal, no rashes or significant lesions EYES: conjunctiva are pink and non-injected, sclera clear LUNGS: clear to auscultation and percussion with normal breathing effort HEART: regular rate & rhythm and no murmurs and no lower extremity edema Musculoskeletal: no cyanosis of digits and no clubbing  PSYCH: alert & oriented x 3, fluent speech NEURO: no focal motor/sensory deficits  LABORATORY DATA:  I have reviewed the data as listed    Latest Ref Rng & Units 12/19/2022    8:18 AM 12/06/2022    8:24 AM 11/22/2022    8:49 AM  CBC  WBC 4.0 - 10.5 K/uL 10.4  9.1  10.1   Hemoglobin 12.0 - 15.0 g/dL 16.1  09.6  04.5   Hematocrit 36.0 - 46.0 % 43.1  39.1  42.2   Platelets 150 - 400 K/uL 412  362  459        Latest Ref Rng & Units 12/19/2022    8:18 AM 12/06/2022    8:24 AM 11/22/2022    8:49 AM  CMP  Glucose 70 - 99 mg/dL 409  93  811   BUN 8 - 23 mg/dL 13  16  20    Creatinine 0.44 - 1.00 mg/dL 9.14  7.82  9.56   Sodium 135 - 145 mmol/L 139  140  138   Potassium 3.5 - 5.1 mmol/L 4.3  4.0  4.3   Chloride 98 - 111 mmol/L 105  108  107   CO2 22 - 32 mmol/L 26  25  25    Calcium 8.9 - 10.3 mg/dL 8.8  8.9  9.0   Total Protein 6.5 - 8.1 g/dL 7.8  6.7  7.4   Total Bilirubin 0.3 - 1.2 mg/dL 0.7  0.7  0.6   Alkaline Phos 38 - 126 U/L 87  80  79   AST  15 - 41 U/L 11  10  11    ALT 0 - 44 U/L 8  8  10      RADIOGRAPHIC STUDIES:  No results found.   ASSESSMENT & PLAN KHAIA SEATON 63 y.o. female with medical history significant for JAK2 positive PV who presents for a follow up visit. After review of the labs, discussion with the patient and review of the prior records her findings are most consistent with polycythemia vera.  The findings show that the patient has iron deficiency, low EPO levels, and marked erythrocytosis and thrombocytosis.  It is most likely that the patient has polycythemia vera which is caused an overproduction of red blood cells resulting in decreased iron stores and subsequent development of thrombocytosis.  The treatments at this time are designed to decrease her risk of thrombosis.  As such we will continue her on 81 mg ASA daily and will add hydroxyurea 500 mg p.o. daily as cytoreductive therapy.  Additionally in order to drop her levels further we will have a target hematocrit of 42%.  For this we will be performing phlebotomy to drop her levels to target.  Previously we discussed the risk of developing acute leukemia as well as myelofibrosis.  We will start phlebotomies as soon as we can and the hydroxyurea prescription has already been prescribed.  We will plan to see her back in approximately 1 month's time in order  to evaluate how she is tolerating therapy.  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 Will call into [facility] pharmacy to have our specialty pharmacist begin the process of acquiring the drug for this patient.  --target HCT 42% (today 43.1%). proceed with phlebotomy today.  --labs today show WBC 10.4, Hgb 13.5, MCV 69.4, Plt 412 --avoid iron  supplementation/iron rich foods as our goal is to decrease the bodies iron supply. --previously discussed risk for development of acute leukemia/myelofibrosis --RTC in 1 month with interval q 2 week phlebotomies.   No orders of the defined types were placed in this encounter.  All questions were answered. The patient knows to call the clinic with any problems, questions or concerns.  I have spent a total of 30 minutes minutes of face-to-face and non-face-to-face time, preparing to see the patient,performing a medically appropriate examination, counseling and educating the patient, documenting clinical information in the electronic health record,  and care coordination.   Ulysees Barns, MD Department of Hematology/Oncology [facility] at [facility] Phone: [phone] Pager: [phone] Email: [email or web address]  12/19/2022 9:32 AM

## 2022-12-19 ENCOUNTER — Other Ambulatory Visit (HOSPITAL_COMMUNITY): Payer: Self-pay

## 2022-12-19 ENCOUNTER — Telehealth: Payer: Self-pay | Admitting: Pharmacist

## 2022-12-19 ENCOUNTER — Inpatient Hospital Stay
Payer: No Typology Code available for payment source | Attending: Hematology and Oncology | Admitting: Hematology and Oncology

## 2022-12-19 ENCOUNTER — Telehealth: Payer: Self-pay

## 2022-12-19 ENCOUNTER — Encounter: Payer: Self-pay | Admitting: Hematology and Oncology

## 2022-12-19 ENCOUNTER — Inpatient Hospital Stay: Payer: No Typology Code available for payment source

## 2022-12-19 ENCOUNTER — Other Ambulatory Visit: Payer: Self-pay

## 2022-12-19 ENCOUNTER — Inpatient Hospital Stay (HOSPITAL_BASED_OUTPATIENT_CLINIC_OR_DEPARTMENT_OTHER): Payer: No Typology Code available for payment source | Attending: Hematology and Oncology

## 2022-12-19 VITALS — BP 209/104 | HR 77 | Temp 98.0°F | Resp 15 | Wt 229.0 lb

## 2022-12-19 VITALS — BP 151/89 | HR 70

## 2022-12-19 DIAGNOSIS — D500 Iron deficiency anemia secondary to blood loss (chronic): Secondary | ICD-10-CM | POA: Diagnosis not present

## 2022-12-19 DIAGNOSIS — Z1589 Genetic susceptibility to other disease: Secondary | ICD-10-CM | POA: Diagnosis not present

## 2022-12-19 DIAGNOSIS — D75838 Other thrombocytosis: Secondary | ICD-10-CM | POA: Diagnosis not present

## 2022-12-19 DIAGNOSIS — D75839 Thrombocytosis, unspecified: Secondary | ICD-10-CM | POA: Diagnosis not present

## 2022-12-19 DIAGNOSIS — Z7982 Long term (current) use of aspirin: Secondary | ICD-10-CM | POA: Diagnosis not present

## 2022-12-19 DIAGNOSIS — D45 Polycythemia vera: Secondary | ICD-10-CM | POA: Diagnosis present

## 2022-12-19 DIAGNOSIS — E611 Iron deficiency: Secondary | ICD-10-CM | POA: Insufficient documentation

## 2022-12-19 LAB — CMP (CANCER CENTER ONLY)
ALT: 8 U/L (ref 0–44)
AST: 11 U/L — ABNORMAL LOW (ref 15–41)
Albumin: 4.3 g/dL (ref 3.5–5.0)
Alkaline Phosphatase: 87 U/L (ref 38–126)
Anion gap: 8 (ref 5–15)
BUN: 13 mg/dL (ref 8–23)
CO2: 26 mmol/L (ref 22–32)
Calcium: 8.8 mg/dL — ABNORMAL LOW (ref 8.9–10.3)
Chloride: 105 mmol/L (ref 98–111)
Creatinine: 0.55 mg/dL (ref 0.44–1.00)
GFR, Estimated: >60 mL/min (ref >60)
Glucose, Bld: 101 mg/dL — ABNORMAL HIGH (ref 70–99)
Potassium: 4.3 mmol/L (ref 3.5–5.1)
Sodium: 139 mmol/L (ref 135–145)
Total Bilirubin: 0.7 mg/dL (ref 0.3–1.2)
Total Protein: 7.8 g/dL (ref 6.5–8.1)

## 2022-12-19 LAB — CBC WITH DIFFERENTIAL (CANCER CENTER ONLY)
Abs Immature Granulocytes: 0.09 10*3/uL — ABNORMAL HIGH (ref 0.00–0.07)
Basophils Absolute: 0.2 10*3/uL — ABNORMAL HIGH (ref 0.0–0.1)
Basophils Relative: 2 %
Eosinophils Absolute: 0.7 10*3/uL — ABNORMAL HIGH (ref 0.0–0.5)
Eosinophils Relative: 7 %
HCT: 43.1 % (ref 36.0–46.0)
Hemoglobin: 13.5 g/dL (ref 12.0–15.0)
Immature Granulocytes: 1 %
Lymphocytes Relative: 16 %
Lymphs Abs: 1.7 10*3/uL (ref 0.7–4.0)
MCH: 21.7 pg — ABNORMAL LOW (ref 26.0–34.0)
MCHC: 31.3 g/dL (ref 30.0–36.0)
MCV: 69.4 fL — ABNORMAL LOW (ref 80.0–100.0)
Monocytes Absolute: 1 10*3/uL (ref 0.1–1.0)
Monocytes Relative: 9 %
Neutro Abs: 6.8 10*3/uL (ref 1.7–7.7)
Neutrophils Relative %: 65 %
Platelet Count: 412 10*3/uL — ABNORMAL HIGH (ref 150–400)
RBC: 6.21 MIL/uL — ABNORMAL HIGH (ref 3.87–5.11)
RDW: 17.2 % — ABNORMAL HIGH (ref 11.5–15.5)
WBC Count: 10.4 10*3/uL (ref 4.0–10.5)
nRBC: 0 % (ref 0.0–0.2)

## 2022-12-19 LAB — FERRITIN: Ferritin: 6 ng/mL — ABNORMAL LOW (ref 11–307)

## 2022-12-19 MED ORDER — RUXOLITINIB PHOSPHATE 10 MG PO TABS: 10.0000 mg | ORAL_TABLET | Freq: Two times a day (BID) | ORAL | 2 refills | Status: DC

## 2022-12-19 MED ORDER — RUXOLITINIB PHOSPHATE 10 MG PO TABS
10.0000 mg | ORAL_TABLET | Freq: Two times a day (BID) | ORAL | 2 refills | Status: DC
  Filled 2022-12-19: qty 60, 30d supply, fill #0

## 2022-12-19 NOTE — Telephone Encounter (Addendum)
Oral Oncology Patient Advocate Encounter  Prior Authorization for Connie Myers has been approved.    PA# [id number]  Effective dates: 12/19/22 through 06/21/23  Patient must fill through [facility].   Script and all supporting documents sent to [facility] for processing and fulfillment.    Ardeen Fillers, CPhT Oncology Pharmacy Patient Advocate  [facility]  [phone] (phone) [phone] (fax) 12/19/2022 2:10 PM

## 2022-12-19 NOTE — Telephone Encounter (Signed)
Oral Oncology Pharmacist Encounter  Received new prescription for Jakafi (ruxolitinib) for the treatment of polycythemia vera, JAK2 positive, planned duration until disease progression or unacceptable drug toxicity.  CBC w/ Diff and CMP from 12/19/22 assessed, no relevant lab abnormalities requiring baseline dose adjustment required at this time. Prescription dose and frequency assessed for appropriateness.  Current medication list in Epic reviewed, no relevant/significant DDIs with Jakafi identified.  Evaluated chart and no patient barriers to medication adherence noted.   Patient agreement for treatment documented in MD note on 12/19/22.  Prescription has been e-scribed to the [facility] for benefits analysis and approval.  Oral Oncology Clinic will continue to follow for insurance authorization, copayment issues, initial counseling and start date.  Lenord Carbo, PharmD, BCPS, BCOP Hematology/Oncology Clinical Pharmacist [facility] and [facility] Oral Chemotherapy Navigation Clinics [phone] 12/19/2022 1:56 PM

## 2022-12-19 NOTE — Telephone Encounter (Signed)
Oral Oncology Pharmacist Encounter   Patient's insurance requires them to fill through [employer]. [facility] prescription has been redirected for dispensing.    Oral Chemotherapy Clinic will follow up with outside pharmacy to ensure Rx is delivered.    Lenord Carbo, PharmD, BCPS, BCOP Hematology/Oncology Clinical Pharmacist [facility] and [facility] Oral Chemotherapy Navigation Clinics [phone] 12/19/2022 2:20 PM

## 2022-12-19 NOTE — Progress Notes (Signed)
Therapeutic phlebotomy done today d/t pt's HCT 43.1.  of blood removed from pt's left AC.  Pt tolerated it well.  Post VS WNL.  Monitored for 30 mins post phlebotomy

## 2022-12-19 NOTE — Telephone Encounter (Signed)
Oral Oncology Patient Advocate Encounter  New authorization   Received notification that prior authorization for Connie Myers is required.   PA submitted on 12/19/22  Key [id number]  Status is pending     Ardeen Fillers, CPhT Oncology Pharmacy Patient Advocate  [facility]  [phone] (phone) [phone] (fax) 12/19/2022 2:05 PM

## 2022-12-19 NOTE — Patient Instructions (Signed)

## 2022-12-20 ENCOUNTER — Other Ambulatory Visit (HOSPITAL_COMMUNITY): Payer: Self-pay

## 2022-12-20 ENCOUNTER — Telehealth: Payer: Self-pay | Admitting: Hematology and Oncology

## 2022-12-21 ENCOUNTER — Other Ambulatory Visit (HOSPITAL_COMMUNITY): Payer: Self-pay

## 2022-12-21 NOTE — Telephone Encounter (Signed)
Oral Chemotherapy Pharmacist Encounter  I spoke with patient for overview of: Jakafi (ruxolitinib) for the treatment of polycythemia vera, JAK2 positive, planned duration until disease progression or unacceptable drug toxicity.   Counseled patient on administration, dosing, side effects, monitoring, drug-food interactions, safe handling, storage, and disposal.  Patient will take Jakafi 10mg  tablets, 1 tablet by mouth 2 times daily, with or without food.  Patient knows to avoid grapefruit and grapefruit juice.  Jakafi start date: pending shipment from [employer] - patient provided with phone number to call  [phone]). Patient states she will call today to set up shipment. Patient knows to stop hydroxyurea when she starts [place].  Adverse effects include but are not limited to: decreased blood counts, headache, diarrhea  Reviewed with patient importance of keeping a medication schedule and plan for any missed doses. No barriers to medication adherence identified.  Medication reconciliation performed and medication/allergy list updated.  All questions answered.  Connie Myers voiced understanding and appreciation.   Medication education handout placed in mail for patient. Patient knows to call the office with questions or concerns. Oral Chemotherapy Clinic phone number provided to patient.   Lenord Carbo, PharmD, BCPS, BCOP Hematology/Oncology Clinical Pharmacist [facility] Oral Chemotherapy Navigation Clinics [phone] 12/21/2022 10:18 AM

## 2023-01-05 ENCOUNTER — Other Ambulatory Visit: Payer: No Typology Code available for payment source

## 2023-01-06 ENCOUNTER — Inpatient Hospital Stay: Payer: No Typology Code available for payment source

## 2023-01-06 ENCOUNTER — Other Ambulatory Visit: Payer: Self-pay

## 2023-01-06 DIAGNOSIS — D45 Polycythemia vera: Secondary | ICD-10-CM | POA: Diagnosis not present

## 2023-01-06 LAB — CMP (CANCER CENTER ONLY)
ALT: 10 U/L (ref 0–44)
AST: 13 U/L — ABNORMAL LOW (ref 15–41)
Albumin: 4.5 g/dL (ref 3.5–5.0)
Alkaline Phosphatase: 87 U/L (ref 38–126)
Anion gap: 7 (ref 5–15)
BUN: 18 mg/dL (ref 8–23)
CO2: 24 mmol/L (ref 22–32)
Calcium: 9.3 mg/dL (ref 8.9–10.3)
Chloride: 108 mmol/L (ref 98–111)
Creatinine: 0.55 mg/dL (ref 0.44–1.00)
GFR, Estimated: >60 mL/min (ref >60)
Glucose, Bld: 126 mg/dL — ABNORMAL HIGH (ref 70–99)
Potassium: 3.8 mmol/L (ref 3.5–5.1)
Sodium: 139 mmol/L (ref 135–145)
Total Bilirubin: 0.7 mg/dL (ref 0.3–1.2)
Total Protein: 7.7 g/dL (ref 6.5–8.1)

## 2023-01-06 LAB — CBC WITH DIFFERENTIAL (CANCER CENTER ONLY)
Abs Immature Granulocytes: 0.02 10*3/uL (ref 0.00–0.07)
Basophils Absolute: 0.2 10*3/uL — ABNORMAL HIGH (ref 0.0–0.1)
Basophils Relative: 3 %
Eosinophils Absolute: 0.5 10*3/uL (ref 0.0–0.5)
Eosinophils Relative: 7 %
HCT: 38.6 % (ref 36.0–46.0)
Hemoglobin: 12.1 g/dL (ref 12.0–15.0)
Immature Granulocytes: 0 %
Lymphocytes Relative: 28 %
Lymphs Abs: 1.9 10*3/uL (ref 0.7–4.0)
MCH: 21.6 pg — ABNORMAL LOW (ref 26.0–34.0)
MCHC: 31.3 g/dL (ref 30.0–36.0)
MCV: 68.8 fL — ABNORMAL LOW (ref 80.0–100.0)
Monocytes Absolute: 0.6 10*3/uL (ref 0.1–1.0)
Monocytes Relative: 9 %
Neutro Abs: 3.5 10*3/uL (ref 1.7–7.7)
Neutrophils Relative %: 53 %
Platelet Count: 511 10*3/uL — ABNORMAL HIGH (ref 150–400)
RBC: 5.61 MIL/uL — ABNORMAL HIGH (ref 3.87–5.11)
RDW: 17.5 % — ABNORMAL HIGH (ref 11.5–15.5)
WBC Count: 6.7 10*3/uL (ref 4.0–10.5)
nRBC: 0 % (ref 0.0–0.2)

## 2023-01-06 LAB — FERRITIN: Ferritin: 9 ng/mL — ABNORMAL LOW (ref 11–307)

## 2023-01-06 NOTE — Progress Notes (Signed)
PT does not meet the requirement for her phlebotomy today. Copy of labs given to patient. She voiced understanding.

## 2023-01-19 ENCOUNTER — Ambulatory Visit: Payer: No Typology Code available for payment source | Admitting: Hematology and Oncology

## 2023-01-19 ENCOUNTER — Other Ambulatory Visit: Payer: No Typology Code available for payment source

## 2023-01-20 ENCOUNTER — Inpatient Hospital Stay (HOSPITAL_BASED_OUTPATIENT_CLINIC_OR_DEPARTMENT_OTHER): Payer: No Typology Code available for payment source | Admitting: Hematology and Oncology

## 2023-01-20 ENCOUNTER — Other Ambulatory Visit: Payer: Self-pay

## 2023-01-20 ENCOUNTER — Inpatient Hospital Stay: Payer: No Typology Code available for payment source

## 2023-01-20 ENCOUNTER — Inpatient Hospital Stay: Payer: No Typology Code available for payment source | Attending: Hematology and Oncology

## 2023-01-20 VITALS — BP 169/74 | HR 67 | Temp 97.5°F | Resp 14 | Wt 231.2 lb

## 2023-01-20 DIAGNOSIS — E611 Iron deficiency: Secondary | ICD-10-CM | POA: Diagnosis not present

## 2023-01-20 DIAGNOSIS — D75839 Thrombocytosis, unspecified: Secondary | ICD-10-CM

## 2023-01-20 DIAGNOSIS — D45 Polycythemia vera: Secondary | ICD-10-CM | POA: Diagnosis present

## 2023-01-20 DIAGNOSIS — Z7982 Long term (current) use of aspirin: Secondary | ICD-10-CM | POA: Insufficient documentation

## 2023-01-20 DIAGNOSIS — Z1589 Genetic susceptibility to other disease: Secondary | ICD-10-CM

## 2023-01-20 LAB — CBC WITH DIFFERENTIAL (CANCER CENTER ONLY)
Abs Immature Granulocytes: 0.04 10*3/uL (ref 0.00–0.07)
Basophils Absolute: 0.2 10*3/uL — ABNORMAL HIGH (ref 0.0–0.1)
Basophils Relative: 2 %
Eosinophils Absolute: 0.5 10*3/uL (ref 0.0–0.5)
Eosinophils Relative: 6 %
HCT: 36.8 % (ref 36.0–46.0)
Hemoglobin: 11.6 g/dL — ABNORMAL LOW (ref 12.0–15.0)
Immature Granulocytes: 1 %
Lymphocytes Relative: 26 %
Lymphs Abs: 2.1 10*3/uL (ref 0.7–4.0)
MCH: 21.5 pg — ABNORMAL LOW (ref 26.0–34.0)
MCHC: 31.5 g/dL (ref 30.0–36.0)
MCV: 68.3 fL — ABNORMAL LOW (ref 80.0–100.0)
Monocytes Absolute: 0.6 10*3/uL (ref 0.1–1.0)
Monocytes Relative: 7 %
Neutro Abs: 4.8 10*3/uL (ref 1.7–7.7)
Neutrophils Relative %: 58 %
Platelet Count: 777 10*3/uL — ABNORMAL HIGH (ref 150–400)
RBC: 5.39 MIL/uL — ABNORMAL HIGH (ref 3.87–5.11)
RDW: 18.1 % — ABNORMAL HIGH (ref 11.5–15.5)
WBC Count: 8.2 10*3/uL (ref 4.0–10.5)
nRBC: 0 % (ref 0.0–0.2)

## 2023-01-20 LAB — CMP (CANCER CENTER ONLY)
ALT: 10 U/L (ref 0–44)
AST: 13 U/L — ABNORMAL LOW (ref 15–41)
Albumin: 4.3 g/dL (ref 3.5–5.0)
Alkaline Phosphatase: 89 U/L (ref 38–126)
Anion gap: 7 (ref 5–15)
BUN: 20 mg/dL (ref 8–23)
CO2: 24 mmol/L (ref 22–32)
Calcium: 8.8 mg/dL — ABNORMAL LOW (ref 8.9–10.3)
Chloride: 108 mmol/L (ref 98–111)
Creatinine: 0.59 mg/dL (ref 0.44–1.00)
GFR, Estimated: >60 mL/min (ref >60)
Glucose, Bld: 122 mg/dL — ABNORMAL HIGH (ref 70–99)
Potassium: 3.8 mmol/L (ref 3.5–5.1)
Sodium: 139 mmol/L (ref 135–145)
Total Bilirubin: 0.6 mg/dL (ref 0.3–1.2)
Total Protein: 7.2 g/dL (ref 6.5–8.1)

## 2023-01-20 LAB — FERRITIN: Ferritin: 8 ng/mL — ABNORMAL LOW (ref 11–307)

## 2023-01-20 NOTE — Progress Notes (Unsigned)
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 First CBC on record 2) 12/29/2017: WBC 14.1, Hgb 15.0, MCV 72.1, Plt 1074 3) 12/28/2018: WBC 10.9, Hgb 16.3, MCV 67.8, Hgb 982 4) 10/25/2019: WBC 9.5, Hgb 15.8, MCV 67.9, Plt 976 5) 12/19/2019: establish care with Dr. Leonides Schanz. JAK2 [id number] mutation returned as positive. Patient already on ASA 81mg  PO daily.  6) 01/16/2020: bone marrow biopsy confirms hypercellular marrow consistent with polycythemia vera. Started hydroxyurea 500 mg PO daily.  7) Transferred care to Dr. Myna Hidalgo at [facility]. 8) 06/28/2022: transition care back to Dr. Leonides Schanz at [facility]  9) 12/19/2022: started treatment with Jakafi 10 mg PO BID   Interval History:  Connie Myers 62 y.o. female with medical history significant for JAK2 positive PV who presents for a follow up visit. The patient's last visit was on 12/19/2022. In the interim since the last visit she has continued treatment with Jakafi.    On exam today Connie Myers notes she has been doing great overall in the interim since her last visit.  She is taking Jakafi 10 mg p.o. twice daily and reports that the results have been "incredible.  She reports it is about a $40 co-pay for 30-day supply.  She reports that she is covered for the next 12 months at least.  She notes that for 5 to 6 days she did have headache on the new medication but now headaches are rare.  She reports that she is not having any issues with bleeding, bruising, or dark stools.  She denies any lightheadedness, dizziness, shortness of breath.  She denies any infectious symptoms such as runny nose, sore throat, or cough.  She has no  other complaints.  A full 10 point ROS is listed below.  Previously we discussed options moving forward including transitioning to [facility].  She notes she would be interested in making that transition.  MEDICAL HISTORY:  Past Medical History:  Diagnosis Date   Allergic rhinitis    Anemia    Arthritis    BMI 35.0-35.9,adult    BMI 36.0-36.9,adult    Complication of anesthesia    " I don't tolerate the caine's (Novacaine, Lidocaine etc. well " also, no codiene.    GERD (gastroesophageal reflux disease)    Hammertoe of right foot    Headache    Hypertension    Plantar wart, right foot    Polycythemia vera (HCC)    Severe obesity (HCC)    Situational stress    Sleep apnea    PMH: " I don't have it now "   Thrombocytosis    Ventral hernia    Wears glasses     SURGICAL HISTORY: Past Surgical History:  Procedure Laterality Date   CESAREAN SECTION     CHOLECYSTECTOMY     COLONOSCOPY     HERNIA REPAIR     INSERTION OF MESH N/A 12/05/2017   Procedure: INSERTION OF MESH;  Surgeon: Manus Rudd, MD;  Location: [facility] OR;  Service: General;  Laterality: N/A;   VENTRAL HERNIA REPAIR  12/05/2017   OPEN VENTRAL HERNIA REPAIR ERAS PATHWAY w/mesh   VENTRAL HERNIA REPAIR N/A 12/05/2017   Procedure: OPEN VENTRAL HERNIA REPAIR ERAS PATHWAY;  Surgeon: Manus Rudd, MD;  Location: [facility] OR;  Service: General;  Laterality: N/A;   VENTRAL HERNIA REPAIR  01/01/2019   VENTRAL HERNIA REPAIR N/A 01/01/2019   Procedure: LAPAROSCOPIC VENTRAL HERNIA REPAIR WITH MESH;  Surgeon: Manus Rudd, MD;  Location: [facility] OR;  Service: General;  Laterality: N/A;   WISDOM TOOTH EXTRACTION     WOUND EXPLORATION N/A 12/29/2017   Procedure: ABDOMINAL WOUND EXPLORATION;  Surgeon: Manus Rudd, MD;  Location: [facility] OR;  Service: General;  Laterality: N/A;  LMA    SOCIAL HISTORY: Social History   Socioeconomic History   Marital status: Married    Spouse name: Not on file   Number of children: Not on file   Years of  education: Not on file   Highest education level: Not on file  Occupational History   Not on file  Tobacco Use   Smoking status: Never   Smokeless tobacco: Never  Vaping Use   Vaping status: Never Used  Substance and Sexual Activity   Alcohol use: Yes    Comment: occasionally   Drug use: Not Currently   Sexual activity: Not on file  Other Topics Concern   Not on file  Social History Narrative   Not on file   Social Determinants of Health   Financial Resource Strain: Not on file  Food Insecurity: Not on file  Transportation Needs: Not on file  Physical Activity: Not on file  Stress: Not on file  Social Connections: Unknown (11/02/2021)   Received from [facility]   Social Network    Social Network: Not on file  Intimate Partner Violence: Unknown (09/23/2021)   Received from [facility]   HITS    Physically Hurt: Not on file    Insult or Talk Down To: Not on file    Threaten Physical Harm: Not on file    Scream or Curse: Not on file    FAMILY HISTORY: Family History  Problem Relation Age of Onset   Heart attack Mother    Breast cancer Mother    Rashes / Skin problems Father    Lupus Sister     ALLERGIES:  is allergic to codeine and local [lidocaine].  MEDICATIONS:  Current Outpatient Medications  Medication Sig Dispense Refill   aspirin EC 81 MG tablet Take 81 mg by mouth daily.     ibuprofen (ADVIL) 200 MG tablet Take 600 mg by mouth every 6 (six) hours as needed.     ruxolitinib phosphate (JAKAFI) 10 MG tablet Take 1 tablet (10 mg total) by mouth 2 (two) times daily. 60 tablet 2   No current facility-administered medications for this visit.    REVIEW OF SYSTEMS:   Constitutional: ( - ) fevers, ( - )  chills , ( - ) night sweats Eyes: ( - ) blurriness of vision, ( - ) double vision, ( - ) watery eyes Ears, nose, mouth, throat, and face: ( - ) mucositis, ( - ) sore throat Respiratory: ( - ) cough, ( - ) dyspnea, ( - ) wheezes Cardiovascular: ( - )  palpitation, ( - ) chest discomfort, ( - ) lower extremity swelling Gastrointestinal:  ( - ) nausea, ( - ) heartburn, ( - ) change in bowel habits Skin: ( - ) abnormal skin rashes Lymphatics: ( - ) new lymphadenopathy, ( - ) easy bruising Neurological: ( - ) numbness, ( - ) tingling, ( - ) new weaknesses Behavioral/Psych: ( - ) mood change, ( - ) new changes  All other systems were reviewed with the patient and are negative.  PHYSICAL EXAMINATION: ECOG PERFORMANCE STATUS: 0 - Asymptomatic  Vitals:   01/20/23 1051  BP: (!) 169/74  Pulse: 67  Resp: 14  Temp: (!) 97.5 F (36.4 C)  SpO2: 99%      Filed Weights   01/20/23 1051  Weight: 231 lb 3.2 oz (104.9 kg)      10/25/22  Temp 98 F (36.7 C)  Temp src Oral  Pulse 78  Resp 17  BP 146/76 !    GENERAL: well appearing middle aged Caucasian female. alert, no distress and comfortable SKIN: skin color, texture, turgor are normal, no rashes or significant lesions EYES: conjunctiva are pink and non-injected, sclera clear LUNGS: clear to auscultation and percussion with normal breathing effort HEART: regular rate & rhythm and no murmurs and no lower extremity edema Musculoskeletal: no cyanosis of digits and no clubbing  PSYCH: alert & oriented x 3, fluent speech NEURO: no focal motor/sensory deficits  LABORATORY DATA:  I have reviewed the data as listed    Latest Ref Rng & Units 01/20/2023   10:07 AM 01/06/2023   10:43 AM 12/19/2022    8:18 AM  CBC  WBC 4.0 - 10.5 K/uL 8.2  6.7  10.4   Hemoglobin 12.0 - 15.0 g/dL 16.1  09.6  04.5   Hematocrit 36.0 - 46.0 % 36.8  38.6  43.1   Platelets 150 - 400 K/uL 777  511  412        Latest Ref Rng & Units 01/20/2023   10:07 AM 01/06/2023   10:43 AM 12/19/2022    8:18 AM  CMP  Glucose 70 - 99 mg/dL 409  811  914   BUN 8 - 23 mg/dL 20  18  13    Creatinine 0.44 - 1.00 mg/dL 7.82  9.56  2.13   Sodium 135 - 145 mmol/L 139  139  139   Potassium 3.5 - 5.1 mmol/L 3.8  3.8  4.3   Chloride  98 - 111 mmol/L 108  108  105   CO2 22 - 32 mmol/L 24  24  26    Calcium 8.9 - 10.3 mg/dL 8.8  9.3  8.8   Total Protein 6.5 - 8.1 g/dL 7.2  7.7  7.8   Total Bilirubin 0.3 - 1.2 mg/dL 0.6  0.7  0.7   Alkaline Phos 38 - 126 U/L 89  87  87   AST 15 - 41 U/L 13  13  11    ALT 0 - 44 U/L 10  10  8      RADIOGRAPHIC STUDIES:  No results found.   ASSESSMENT & PLAN Connie Myers 62 y.o. female with medical history significant for JAK2 positive PV who presents for a follow up visit. After review of the labs, discussion with the patient and review of the prior records her findings are most consistent with polycythemia vera.  The findings show that the patient has iron deficiency, low EPO levels, and marked erythrocytosis and thrombocytosis.  It is most likely that the patient has polycythemia vera which is caused an overproduction of red blood cells resulting in decreased iron stores and subsequent development of thrombocytosis.  The treatments at this time are designed to decrease her risk of thrombosis.  As such we will continue her on 81 mg ASA daily and will add hydroxyurea 500 mg p.o. daily as cytoreductive therapy.  Additionally in order to drop her levels further we will have  a target hematocrit of 42%.  For this we will be performing phlebotomy to drop her levels to target.  Previously we discussed the risk of developing acute leukemia as well as myelofibrosis.  We will start phlebotomies as soon as we can and the hydroxyurea prescription has already been prescribed.  We will plan to see her back in approximately 1 month's time in order to evaluate how she is tolerating therapy.  # Polycythemia Vera, JAK2 Positive # Thrombocytosis 2/2 to Iron Deficiency --continue ASA 81mg  PO daily for thromboprophylaxis --continue hydroxyurea 500mg  PO BID for cytoreductive therapy -- due to symptoms of hydroxyurea (diarrhea, sores, headache) had to discontinue. --continue Jakafi 10 mg BID --target HCT  42% (today 36.8%). No need for phlebotomy today.  --labs today show WBC 8.2, Hgb 11.6, MCV 68.3, Plt 777 --avoid iron supplementation/iron rich foods as our goal is to decrease the bodies iron supply. --previously discussed risk for development of acute leukemia/myelofibrosis --RTC in 3 months with interval 6 weeks labs.   No orders of the defined types were placed in this encounter.  All questions were answered. The patient knows to call the clinic with any problems, questions or concerns.  I have spent a total of 30 minutes minutes of face-to-face and non-face-to-face time, preparing to see the patient,performing a medically appropriate examination, counseling and educating the patient, documenting clinical information in the electronic health record,  and care coordination.   Ulysees Barns, MD Department of Hematology/Oncology [facility] at [facility] Phone: [phone] Pager: [phone] Email: [email or web address]  01/20/2023 10:55 AM

## 2023-01-22 ENCOUNTER — Encounter: Payer: Self-pay | Admitting: Hematology and Oncology

## 2023-03-01 ENCOUNTER — Inpatient Hospital Stay: Payer: No Typology Code available for payment source | Attending: Hematology and Oncology

## 2023-03-01 DIAGNOSIS — D45 Polycythemia vera: Secondary | ICD-10-CM | POA: Diagnosis present

## 2023-03-01 DIAGNOSIS — D75839 Thrombocytosis, unspecified: Secondary | ICD-10-CM | POA: Diagnosis not present

## 2023-03-01 DIAGNOSIS — E611 Iron deficiency: Secondary | ICD-10-CM | POA: Insufficient documentation

## 2023-03-01 LAB — CBC WITH DIFFERENTIAL (CANCER CENTER ONLY)
Abs Immature Granulocytes: 0.04 10*3/uL (ref 0.00–0.07)
Basophils Absolute: 0.1 10*3/uL (ref 0.0–0.1)
Basophils Relative: 2 %
Eosinophils Absolute: 0.4 10*3/uL (ref 0.0–0.5)
Eosinophils Relative: 4 %
HCT: 38.4 % (ref 36.0–46.0)
Hemoglobin: 11.9 g/dL — ABNORMAL LOW (ref 12.0–15.0)
Immature Granulocytes: 1 %
Lymphocytes Relative: 14 %
Lymphs Abs: 1.2 10*3/uL (ref 0.7–4.0)
MCH: 21.6 pg — ABNORMAL LOW (ref 26.0–34.0)
MCHC: 31 g/dL (ref 30.0–36.0)
MCV: 69.6 fL — ABNORMAL LOW (ref 80.0–100.0)
Monocytes Absolute: 1 10*3/uL (ref 0.1–1.0)
Monocytes Relative: 12 %
Neutro Abs: 5.7 10*3/uL (ref 1.7–7.7)
Neutrophils Relative %: 67 %
Platelet Count: 798 10*3/uL — ABNORMAL HIGH (ref 150–400)
RBC: 5.52 MIL/uL — ABNORMAL HIGH (ref 3.87–5.11)
RDW: 18 % — ABNORMAL HIGH (ref 11.5–15.5)
WBC Count: 8.4 10*3/uL (ref 4.0–10.5)
nRBC: 0 % (ref 0.0–0.2)

## 2023-03-01 LAB — CMP (CANCER CENTER ONLY)
ALT: 10 U/L (ref 0–44)
AST: 12 U/L — ABNORMAL LOW (ref 15–41)
Albumin: 4.2 g/dL (ref 3.5–5.0)
Alkaline Phosphatase: 81 U/L (ref 38–126)
Anion gap: 6 (ref 5–15)
BUN: 18 mg/dL (ref 8–23)
CO2: 27 mmol/L (ref 22–32)
Calcium: 9 mg/dL (ref 8.9–10.3)
Chloride: 106 mmol/L (ref 98–111)
Creatinine: 0.61 mg/dL (ref 0.44–1.00)
GFR, Estimated: >60 mL/min (ref >60)
Glucose, Bld: 104 mg/dL — ABNORMAL HIGH (ref 70–99)
Potassium: 4.1 mmol/L (ref 3.5–5.1)
Sodium: 139 mmol/L (ref 135–145)
Total Bilirubin: 0.5 mg/dL (ref 0.3–1.2)
Total Protein: 7.4 g/dL (ref 6.5–8.1)

## 2023-03-01 LAB — FERRITIN: Ferritin: 7 ng/mL — ABNORMAL LOW (ref 11–307)

## 2023-03-03 ENCOUNTER — Other Ambulatory Visit: Payer: Self-pay | Admitting: Hematology and Oncology

## 2023-03-03 ENCOUNTER — Telehealth: Payer: Self-pay | Admitting: *Deleted

## 2023-03-03 DIAGNOSIS — Z1589 Genetic susceptibility to other disease: Secondary | ICD-10-CM

## 2023-03-03 DIAGNOSIS — D45 Polycythemia vera: Secondary | ICD-10-CM

## 2023-03-03 NOTE — Telephone Encounter (Signed)
TCT patient regarding recent lab results. No answer but was able to leave detailed message on her identified vm. Advised that her hematocrit is on target at 38.4%. We will plan to see her back in October as scheduled. Advised that she can call next week if she has any quesations or concerns @ [phone]

## 2023-03-03 NOTE — Telephone Encounter (Signed)
-----   Message from Ulysees Barns IV sent at 03/02/2023  9:12 AM EDT ----- Please let Connie Myers know that her hematocrit is on target at 38.4%.  We will plan to see her back in October as scheduled. ----- Message ----- From: Leory Plowman, Lab In [place] Sent: 03/01/2023  10:11 AM EDT To: Jaci Standard, MD

## 2023-03-07 ENCOUNTER — Telehealth: Payer: Self-pay | Admitting: *Deleted

## 2023-03-07 NOTE — Telephone Encounter (Signed)
Returned PC to patient, no answer, left VM - she called earlier stating she got a call from [facility] saying that her Jakafi refill was canceled.  This RN contacted [employer], according to them, patient's refill of Earvin Hansen is ready, she just needs to call & arrange shipment.  Patient informed on voicemail of situation, phone number to [facility] given, instructed her to call this office with any further questions/concerns, [phone].

## 2023-03-22 ENCOUNTER — Encounter: Payer: Self-pay | Admitting: Hematology and Oncology

## 2023-03-22 NOTE — Progress Notes (Signed)
Received call from patient being referred to me from [facility] regarding a billing concern.  Listened to patient's concern and advised I would be sending a message to my manager Olegario Messier S to follow up her. She was very Adult nurse. Staff message sent to [facility].  She has my contact name and number for any additional financial questions or concerns.

## 2023-04-12 ENCOUNTER — Inpatient Hospital Stay (HOSPITAL_BASED_OUTPATIENT_CLINIC_OR_DEPARTMENT_OTHER): Payer: No Typology Code available for payment source | Admitting: Hematology and Oncology

## 2023-04-12 ENCOUNTER — Inpatient Hospital Stay: Payer: No Typology Code available for payment source | Attending: Hematology and Oncology

## 2023-04-12 ENCOUNTER — Other Ambulatory Visit: Payer: Self-pay

## 2023-04-12 ENCOUNTER — Telehealth: Payer: Self-pay

## 2023-04-12 ENCOUNTER — Inpatient Hospital Stay: Payer: No Typology Code available for payment source

## 2023-04-12 VITALS — BP 146/87 | HR 67 | Temp 97.5°F | Resp 13 | Wt 231.8 lb

## 2023-04-12 DIAGNOSIS — Z7964 Long term (current) use of myelosuppressive agent: Secondary | ICD-10-CM | POA: Diagnosis not present

## 2023-04-12 DIAGNOSIS — R200 Anesthesia of skin: Secondary | ICD-10-CM | POA: Diagnosis not present

## 2023-04-12 DIAGNOSIS — Z7982 Long term (current) use of aspirin: Secondary | ICD-10-CM | POA: Diagnosis not present

## 2023-04-12 DIAGNOSIS — Z885 Allergy status to narcotic agent status: Secondary | ICD-10-CM | POA: Diagnosis not present

## 2023-04-12 DIAGNOSIS — D45 Polycythemia vera: Secondary | ICD-10-CM | POA: Insufficient documentation

## 2023-04-12 DIAGNOSIS — D500 Iron deficiency anemia secondary to blood loss (chronic): Secondary | ICD-10-CM

## 2023-04-12 DIAGNOSIS — Z1589 Genetic susceptibility to other disease: Secondary | ICD-10-CM

## 2023-04-12 DIAGNOSIS — D75838 Other thrombocytosis: Secondary | ICD-10-CM | POA: Insufficient documentation

## 2023-04-12 DIAGNOSIS — D75839 Thrombocytosis, unspecified: Secondary | ICD-10-CM

## 2023-04-12 DIAGNOSIS — Z803 Family history of malignant neoplasm of breast: Secondary | ICD-10-CM | POA: Diagnosis not present

## 2023-04-12 DIAGNOSIS — E611 Iron deficiency: Secondary | ICD-10-CM | POA: Insufficient documentation

## 2023-04-12 DIAGNOSIS — Z8249 Family history of ischemic heart disease and other diseases of the circulatory system: Secondary | ICD-10-CM | POA: Insufficient documentation

## 2023-04-12 LAB — CBC WITH DIFFERENTIAL (CANCER CENTER ONLY)
Abs Immature Granulocytes: 0.07 10*3/uL (ref 0.00–0.07)
Basophils Absolute: 0.2 10*3/uL — ABNORMAL HIGH (ref 0.0–0.1)
Basophils Relative: 2 %
Eosinophils Absolute: 0.4 10*3/uL (ref 0.0–0.5)
Eosinophils Relative: 4 %
HCT: 40.3 % (ref 36.0–46.0)
Hemoglobin: 13 g/dL (ref 12.0–15.0)
Immature Granulocytes: 1 %
Lymphocytes Relative: 21 %
Lymphs Abs: 2.1 10*3/uL (ref 0.7–4.0)
MCH: 22.2 pg — ABNORMAL LOW (ref 26.0–34.0)
MCHC: 32.3 g/dL (ref 30.0–36.0)
MCV: 68.8 fL — ABNORMAL LOW (ref 80.0–100.0)
Monocytes Absolute: 1.1 10*3/uL — ABNORMAL HIGH (ref 0.1–1.0)
Monocytes Relative: 11 %
Neutro Abs: 6.1 10*3/uL (ref 1.7–7.7)
Neutrophils Relative %: 61 %
Platelet Count: 1076 10*3/uL (ref 150–400)
RBC: 5.86 MIL/uL — ABNORMAL HIGH (ref 3.87–5.11)
RDW: 19.1 % — ABNORMAL HIGH (ref 11.5–15.5)
Smear Review: INCREASED
WBC Count: 10 10*3/uL (ref 4.0–10.5)
nRBC: 0 % (ref 0.0–0.2)

## 2023-04-12 LAB — CMP (CANCER CENTER ONLY)
ALT: 12 U/L (ref 0–44)
AST: 13 U/L — ABNORMAL LOW (ref 15–41)
Albumin: 4.6 g/dL (ref 3.5–5.0)
Alkaline Phosphatase: 91 U/L (ref 38–126)
Anion gap: 5 (ref 5–15)
BUN: 26 mg/dL — ABNORMAL HIGH (ref 8–23)
CO2: 29 mmol/L (ref 22–32)
Calcium: 9.5 mg/dL (ref 8.9–10.3)
Chloride: 100 mmol/L (ref 98–111)
Creatinine: 0.69 mg/dL (ref 0.44–1.00)
GFR, Estimated: >60 mL/min (ref >60)
Glucose, Bld: 109 mg/dL — ABNORMAL HIGH (ref 70–99)
Potassium: 4 mmol/L (ref 3.5–5.1)
Sodium: 134 mmol/L — ABNORMAL LOW (ref 135–145)
Total Bilirubin: 0.6 mg/dL (ref 0.3–1.2)
Total Protein: 8.1 g/dL (ref 6.5–8.1)

## 2023-04-12 LAB — FERRITIN: Ferritin: 9 ng/mL — ABNORMAL LOW (ref 11–307)

## 2023-04-12 NOTE — Progress Notes (Signed)
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 First CBC on record 2) 12/29/2017: WBC 14.1, Hgb 15.0, MCV 72.1, Plt 1074 3) 12/28/2018: WBC 10.9, Hgb 16.3, MCV 67.8, Hgb 982 4) 10/25/2019: WBC 9.5, Hgb 15.8, MCV 67.9, Plt 976 5) 12/19/2019: establish care with Dr. Leonides Schanz. JAK2 V617F mutation returned as positive. Patient already on ASA 81mg  PO daily.  6) 01/16/2020: bone marrow biopsy confirms hypercellular marrow consistent with polycythemia vera. Started hydroxyurea 500 mg PO daily.  7) Transferred care to Dr. Myna Hidalgo at [facility]. 8) 06/28/2022: transition care back to Dr. Leonides Schanz at [facility]  9) 12/19/2022: started treatment with Jakafi 10 mg PO BID   Interval History:  Connie Myers 62 y.o. female with medical history significant for JAK2 positive PV who presents for a follow up visit. The patient's last visit was on 01/20/2023. In the interim since the last visit she has continued treatment with Jakafi.    On exam today Connie Myers notes she has been having some numbness down her left arm from the elbow on down.  She reports it does not happen on a daily basis but happens every few days.  She reports that she moves it around and the sensation returns.  She notes that she has been asked to take potassium pills with her blood pressure medication.  She is also trying her best to eat potassium rich foods including potatoes and bananas.  Overall she feels like she is tolerating the Jakafi well and is not having any side effects as a result of the treatment.  She denies any bleeding, bruising, or dark stools.  She has no other complaints.  A full 10 point ROS is listed  below.  MEDICAL HISTORY:  Past Medical History:  Diagnosis Date   Allergic rhinitis    Anemia    Arthritis    BMI 35.0-35.9,adult    BMI 36.0-36.9,adult    Complication of anesthesia    " I don't tolerate the caine's (Novacaine, Lidocaine etc. well " also, no codiene.    GERD (gastroesophageal reflux disease)    Hammertoe of right foot    Headache    Hypertension    Plantar wart, right foot    Polycythemia vera (HCC)    Severe obesity (HCC)    Situational stress    Sleep apnea    PMH: " I don't have it now "   Thrombocytosis    Ventral hernia    Wears glasses     SURGICAL HISTORY: Past Surgical History:  Procedure Laterality Date   CESAREAN SECTION     CHOLECYSTECTOMY     COLONOSCOPY     HERNIA REPAIR     INSERTION OF MESH N/A 12/05/2017   Procedure: INSERTION OF MESH;  Surgeon: Manus Rudd, MD;  Location: [facility] OR;  Service: General;  Laterality: N/A;   VENTRAL HERNIA REPAIR  12/05/2017   OPEN VENTRAL HERNIA REPAIR ERAS PATHWAY w/mesh   VENTRAL HERNIA REPAIR N/A 12/05/2017   Procedure: OPEN VENTRAL HERNIA REPAIR ERAS PATHWAY;  Surgeon: Manus Rudd, MD;  Location: [facility] OR;  Service: General;  Laterality: N/A;   VENTRAL HERNIA REPAIR  01/01/2019   VENTRAL HERNIA REPAIR N/A 01/01/2019  Procedure: LAPAROSCOPIC VENTRAL HERNIA REPAIR WITH MESH;  Surgeon: Manus Rudd, MD;  Location: [facility] OR;  Service: General;  Laterality: N/A;   WISDOM TOOTH EXTRACTION     WOUND EXPLORATION N/A 12/29/2017   Procedure: ABDOMINAL WOUND EXPLORATION;  Surgeon: Manus Rudd, MD;  Location: [facility] OR;  Service: General;  Laterality: N/A;  LMA    SOCIAL HISTORY: Social History   Socioeconomic History   Marital status: Married    Spouse name: Not on file   Number of children: Not on file   Years of education: Not on file   Highest education level: Not on file  Occupational History   Not on file  Tobacco Use   Smoking status: Never   Smokeless tobacco: Never  Vaping Use   Vaping status:  Never Used  Substance and Sexual Activity   Alcohol use: Yes    Comment: occasionally   Drug use: Not Currently   Sexual activity: Not on file  Other Topics Concern   Not on file  Social History Narrative   Not on file   Social Determinants of Health   Financial Resource Strain: Not on file  Food Insecurity: Not on file  Transportation Needs: Not on file  Physical Activity: Not on file  Stress: Not on file  Social Connections: Unknown (11/02/2021)   Received from [facility]   Social Network    Social Network: Not on file  Intimate Partner Violence: Unknown (09/23/2021)   Received from [facility], [facility]   HITS    Physically Hurt: Not on file    Insult or Talk Down To: Not on file    Threaten Physical Harm: Not on file    Scream or Curse: Not on file    FAMILY HISTORY: Family History  Problem Relation Age of Onset   Heart attack Mother    Breast cancer Mother    Rashes / Skin problems Father    Lupus Sister     ALLERGIES:  is allergic to codeine and local [lidocaine].  MEDICATIONS:  Current Outpatient Medications  Medication Sig Dispense Refill   chlorthalidone (HYGROTON) 25 MG tablet Take 25 mg by mouth every morning.     irbesartan (AVAPRO) 300 MG tablet Take 300 mg by mouth daily.     aspirin EC 81 MG tablet Take 81 mg by mouth daily.     ibuprofen (ADVIL) 200 MG tablet Take 600 mg by mouth every 6 (six) hours as needed.     ruxolitinib phosphate (JAKAFI) 10 MG tablet TAKE 1 TABLET BY MOUTH TWICE  DAILY 30 tablet 2   No current facility-administered medications for this visit.    REVIEW OF SYSTEMS:   Constitutional: ( - ) fevers, ( - )  chills , ( - ) night sweats Eyes: ( - ) blurriness of vision, ( - ) double vision, ( - ) watery eyes Ears, nose, mouth, throat, and face: ( - ) mucositis, ( - ) sore throat Respiratory: ( - ) cough, ( - ) dyspnea, ( - ) wheezes Cardiovascular: ( - ) palpitation, ( - ) chest discomfort, ( - ) lower  extremity swelling Gastrointestinal:  ( - ) nausea, ( - ) heartburn, ( - ) change in bowel habits Skin: ( - ) abnormal skin rashes Lymphatics: ( - ) new lymphadenopathy, ( - ) easy bruising Neurological: ( - ) numbness, ( - ) tingling, ( - ) new weaknesses Behavioral/Psych: ( - ) mood change, ( - ) new changes  All other systems were reviewed with the patient and are negative.  PHYSICAL EXAMINATION: ECOG PERFORMANCE STATUS: 0 - Asymptomatic  Vitals:   04/12/23 0957  BP: (!) 146/87  Pulse: 67  Resp: 13  Temp: (!) 97.5 F (36.4 C)  SpO2: 98%       Filed Weights   04/12/23 0957  Weight: 231 lb 12.8 oz (105.1 kg)       10/25/22  Temp 98 F (36.7 C)  Temp src Oral  Pulse 78  Resp 17  BP 146/76 !    GENERAL: well appearing middle aged Caucasian female. alert, no distress and comfortable SKIN: skin color, texture, turgor are normal, no rashes or significant lesions EYES: conjunctiva are pink and non-injected, sclera clear LUNGS: clear to auscultation and percussion with normal breathing effort HEART: regular rate & rhythm and no murmurs and no lower extremity edema Musculoskeletal: no cyanosis of digits and no clubbing  PSYCH: alert & oriented x 3, fluent speech NEURO: no focal motor/sensory deficits  LABORATORY DATA:  I have reviewed the data as listed    Latest Ref Rng & Units 04/12/2023    9:32 AM 03/01/2023    9:50 AM 01/20/2023   10:07 AM  CBC  WBC 4.0 - 10.5 K/uL 10.0  8.4  8.2   Hemoglobin 12.0 - 15.0 g/dL 29.5  62.1  30.8   Hematocrit 36.0 - 46.0 % 40.3  38.4  36.8   Platelets 150 - 400 K/uL 1,076  798  777        Latest Ref Rng & Units 04/12/2023    9:32 AM 03/01/2023    9:50 AM 01/20/2023   10:07 AM  CMP  Glucose 70 - 99 mg/dL 657  846  962   BUN 8 - 23 mg/dL 26  18  20    Creatinine 0.44 - 1.00 mg/dL 9.52  8.41  3.24   Sodium 135 - 145 mmol/L 134  139  139   Potassium 3.5 - 5.1 mmol/L 4.0  4.1  3.8   Chloride 98 - 111 mmol/L 100  106  108   CO2  22 - 32 mmol/L 29  27  24    Calcium 8.9 - 10.3 mg/dL 9.5  9.0  8.8   Total Protein 6.5 - 8.1 g/dL 8.1  7.4  7.2   Total Bilirubin 0.3 - 1.2 mg/dL 0.6  0.5  0.6   Alkaline Phos 38 - 126 U/L 91  81  89   AST 15 - 41 U/L 13  12  13    ALT 0 - 44 U/L 12  10  10      RADIOGRAPHIC STUDIES:  No results found.   ASSESSMENT & PLAN Connie Myers 62 y.o. female with medical history significant for JAK2 positive PV who presents for a follow up visit. After review of the labs, discussion with the patient and review of the prior records her findings are most consistent with polycythemia vera.  The findings show that the patient has iron deficiency, low EPO levels, and marked erythrocytosis and thrombocytosis.  It is most likely that the patient has polycythemia vera which is caused an overproduction of red blood cells resulting in decreased iron stores and subsequent development of thrombocytosis.  The treatments at this time are designed to decrease her risk of thrombosis.  As such we will continue her on 81 mg ASA daily and will add hydroxyurea 500 mg p.o. daily as cytoreductive therapy.  Additionally in order to drop her levels  further we will have a target hematocrit of 42%.  For this we will be performing phlebotomy to drop her levels to target.  Previously we discussed the risk of developing acute leukemia as well as myelofibrosis.  We will start phlebotomies as soon as we can and the hydroxyurea prescription has already been prescribed.  We will plan to see her back in approximately 1 month's time in order to evaluate how she is tolerating therapy.  # Polycythemia Vera, JAK2 Positive # Thrombocytosis 2/2 to Iron Deficiency --continue ASA 81mg  PO daily for thromboprophylaxis --continue hydroxyurea 500mg  PO BID for cytoreductive therapy -- due to symptoms of hydroxyurea (diarrhea, sores, headache) had to discontinue. --continue Jakafi 10 mg BID --target HCT 42% (today 40.3%). No need for  phlebotomy today.  --labs today show WBC 10.0, Hgb 13.0, MCV 68.8, Plt 1076 --avoid iron supplementation/iron rich foods as our goal is to decrease the bodies iron supply. --previously discussed risk for development of acute leukemia/myelofibrosis --RTC in 3 months   No orders of the defined types were placed in this encounter.  All questions were answered. The patient knows to call the clinic with any problems, questions or concerns.  I have spent a total of 30 minutes minutes of face-to-face and non-face-to-face time, preparing to see the patient,performing a medically appropriate examination, counseling and educating the patient, documenting clinical information in the electronic health record,  and care coordination.   Ulysees Barns, MD Department of Hematology/Oncology [facility] at [facility] Phone: [phone] Pager: [phone] Email: [email or web address]  04/14/2023 9:20 AM

## 2023-04-12 NOTE — Telephone Encounter (Signed)
CRITICAL VALUE STICKER  CRITICAL VALUE: Plat 1076  RECEIVER (on-site recipient of call): Sharlette Dense CMA  DATE & TIME NOTIFIED: 04/12/2023 1015  MESSENGER (representative from lab): elizabeth in lab  MD NOTIFIED: Dr. Leonides Schanz  TIME OF NOTIFICATION: 1015  RESPONSE: made doctor and nurse aware

## 2023-04-14 ENCOUNTER — Encounter: Payer: Self-pay | Admitting: Hematology and Oncology

## 2023-04-28 ENCOUNTER — Other Ambulatory Visit: Payer: Self-pay | Admitting: Family Medicine

## 2023-04-28 DIAGNOSIS — Z1231 Encounter for screening mammogram for malignant neoplasm of breast: Secondary | ICD-10-CM

## 2023-05-23 ENCOUNTER — Other Ambulatory Visit: Payer: Self-pay | Admitting: Hematology and Oncology

## 2023-05-23 DIAGNOSIS — D45 Polycythemia vera: Secondary | ICD-10-CM

## 2023-05-23 DIAGNOSIS — Z1589 Genetic susceptibility to other disease: Secondary | ICD-10-CM

## 2023-06-02 ENCOUNTER — Ambulatory Visit
Admission: RE | Admit: 2023-06-02 | Discharge: 2023-06-02 | Disposition: A | Discharge status: Home / Self Care | Payer: No Typology Code available for payment source | Source: Ambulatory Visit | Attending: Family Medicine | Admitting: Family Medicine

## 2023-06-02 DIAGNOSIS — Z1231 Encounter for screening mammogram for malignant neoplasm of breast: Secondary | ICD-10-CM

## 2023-06-29 ENCOUNTER — Telehealth: Payer: Self-pay | Admitting: Pharmacy Technician

## 2023-06-29 NOTE — Telephone Encounter (Signed)
 Oral Oncology Patient Advocate Encounter   Received notification that prior authorization for Jakafi  is due for renewal.   PA submitted on 06/29/23 Key [id number]  Status is pending     Estefana Moellers, CPhT-Adv Oncology Pharmacy Patient Advocate [facility] Direct Number: [phone]  Fax: [phone]

## 2023-06-29 NOTE — Telephone Encounter (Signed)
 Oral Oncology Patient Advocate Encounter  Prior Authorization for Jakafi  has been approved.    PA# [id number] Effective dates: 06/29/23 through 12/27/23  Patient may continue to fill at [facility].    Estefana Moellers, CPhT-Adv Oncology Pharmacy Patient Advocate [facility] Direct Number: [phone]  Fax: [phone]

## 2023-07-12 ENCOUNTER — Inpatient Hospital Stay: Payer: No Typology Code available for payment source | Attending: Hematology and Oncology

## 2023-07-12 ENCOUNTER — Inpatient Hospital Stay (HOSPITAL_BASED_OUTPATIENT_CLINIC_OR_DEPARTMENT_OTHER): Payer: No Typology Code available for payment source | Admitting: Hematology and Oncology

## 2023-07-12 VITALS — BP 176/99 | HR 65 | Temp 98.0°F | Resp 15 | Wt 233.7 lb

## 2023-07-12 DIAGNOSIS — Z7964 Long term (current) use of myelosuppressive agent: Secondary | ICD-10-CM | POA: Diagnosis not present

## 2023-07-12 DIAGNOSIS — D45 Polycythemia vera: Secondary | ICD-10-CM | POA: Diagnosis present

## 2023-07-12 DIAGNOSIS — D75839 Thrombocytosis, unspecified: Secondary | ICD-10-CM | POA: Diagnosis not present

## 2023-07-12 DIAGNOSIS — Z7982 Long term (current) use of aspirin: Secondary | ICD-10-CM | POA: Diagnosis not present

## 2023-07-12 DIAGNOSIS — Z1589 Genetic susceptibility to other disease: Secondary | ICD-10-CM | POA: Diagnosis not present

## 2023-07-12 DIAGNOSIS — M797 Fibromyalgia: Secondary | ICD-10-CM | POA: Insufficient documentation

## 2023-07-12 DIAGNOSIS — E611 Iron deficiency: Secondary | ICD-10-CM | POA: Insufficient documentation

## 2023-07-12 DIAGNOSIS — R0981 Nasal congestion: Secondary | ICD-10-CM | POA: Insufficient documentation

## 2023-07-12 DIAGNOSIS — R059 Cough, unspecified: Secondary | ICD-10-CM | POA: Diagnosis not present

## 2023-07-12 DIAGNOSIS — Z803 Family history of malignant neoplasm of breast: Secondary | ICD-10-CM | POA: Insufficient documentation

## 2023-07-12 DIAGNOSIS — D75838 Other thrombocytosis: Secondary | ICD-10-CM | POA: Diagnosis not present

## 2023-07-12 LAB — CBC WITH DIFFERENTIAL (CANCER CENTER ONLY)
Abs Immature Granulocytes: 0.33 10*3/uL — ABNORMAL HIGH (ref 0.00–0.07)
Basophils Absolute: 0.4 10*3/uL — ABNORMAL HIGH (ref 0.0–0.1)
Basophils Relative: 2 %
Eosinophils Absolute: 0.5 10*3/uL (ref 0.0–0.5)
Eosinophils Relative: 3 %
HCT: 41.9 % (ref 36.0–46.0)
Hemoglobin: 13.4 g/dL (ref 12.0–15.0)
Immature Granulocytes: 2 %
Lymphocytes Relative: 12 %
Lymphs Abs: 1.9 10*3/uL (ref 0.7–4.0)
MCH: 22.5 pg — ABNORMAL LOW (ref 26.0–34.0)
MCHC: 32 g/dL (ref 30.0–36.0)
MCV: 70.3 fL — ABNORMAL LOW (ref 80.0–100.0)
Monocytes Absolute: 2.1 10*3/uL — ABNORMAL HIGH (ref 0.1–1.0)
Monocytes Relative: 13 %
Neutro Abs: 11.2 10*3/uL — ABNORMAL HIGH (ref 1.7–7.7)
Neutrophils Relative %: 68 %
Platelet Count: 1403 10*3/uL (ref 150–400)
RBC: 5.96 MIL/uL — ABNORMAL HIGH (ref 3.87–5.11)
RDW: 19.9 % — ABNORMAL HIGH (ref 11.5–15.5)
WBC Count: 16.4 10*3/uL — ABNORMAL HIGH (ref 4.0–10.5)
nRBC: 0 % (ref 0.0–0.2)

## 2023-07-12 LAB — CMP (CANCER CENTER ONLY)
ALT: 8 U/L (ref 0–44)
AST: 14 U/L — ABNORMAL LOW (ref 15–41)
Albumin: 4.2 g/dL (ref 3.5–5.0)
Alkaline Phosphatase: 74 U/L (ref 38–126)
Anion gap: 3 — ABNORMAL LOW (ref 5–15)
BUN: 14 mg/dL (ref 8–23)
CO2: 29 mmol/L (ref 22–32)
Calcium: 9.3 mg/dL (ref 8.9–10.3)
Chloride: 104 mmol/L (ref 98–111)
Creatinine: 0.62 mg/dL (ref 0.44–1.00)
GFR, Estimated: >60 mL/min (ref >60)
Glucose, Bld: 103 mg/dL — ABNORMAL HIGH (ref 70–99)
Potassium: 4.3 mmol/L (ref 3.5–5.1)
Sodium: 136 mmol/L (ref 135–145)
Total Bilirubin: 0.5 mg/dL (ref 0.0–1.2)
Total Protein: 7.5 g/dL (ref 6.5–8.1)

## 2023-07-12 LAB — FERRITIN: Ferritin: 9 ng/mL — ABNORMAL LOW (ref 11–307)

## 2023-07-12 MED ORDER — AZITHROMYCIN 250 MG PO TABS: ORAL_TABLET | ORAL | 0 refills | Status: AC

## 2023-07-12 NOTE — Progress Notes (Signed)
[facility] [phone]   [phone]  PROGRESS NOTE  Patient Care Team: Aliene Beams, MD as PCP - General (Family Medicine) Wendall Stade, MD as PCP - Cardiology (Cardiology)  Hematological/Oncological History # Polycythemia Vera, JAK2 Positive # Thrombocytosis 2/2 to Iron Deficiency 1) 08/14/2017: WBC 8.0, Hgb 16.2, Plt 713, MCV 71.9. First CBC on record 2) 12/29/2017: WBC 14.1, Hgb 15.0, MCV 72.1, Plt 1074 3) 12/28/2018: WBC 10.9, Hgb 16.3, MCV 67.8, Hgb 982 4) 10/25/2019: WBC 9.5, Hgb 15.8, MCV 67.9, Plt 976 5) 12/19/2019: establish care with Dr. Leonides Schanz. JAK2 V617F mutation returned as positive. Patient already on ASA 81mg  PO daily.  6) 01/16/2020: bone marrow biopsy confirms hypercellular marrow consistent with polycythemia vera. Started hydroxyurea 500 mg PO daily.  7) Transferred care to Dr. Myna Hidalgo at [facility]. 8) 06/28/2022: transition care back to Dr. Leonides Schanz at [facility]  9) 12/19/2022: started treatment with Jakafi 10 mg PO BID   Interval History:  Connie Myers 63 y.o. female with medical history significant for JAK2 positive PV who presents for a follow up visit. The patient's last visit was on 04/12/2023. In the interim since the last visit she has continued treatment with Jakafi.    On exam today Connie Myers notes she has had a rough time with interim since her last visit.  She reports that for about 2 weeks she did severe viral infection in November 2024.  She still has some residual symptoms as well as a cough and congestion.  She reports that she is not having any fever.  She reports that she feels like she is eating well and trying to take more supplements including tumeric, ginger, and Peppercorn.  She reports that she does have issues with her fibromyalgia when the blood pressure cuff is applied.  She notes that she is tolerating her Jakafi well but does miss eating grapefruit which is restricted while on the medication.. Overall  she feels like she is tolerating the Jakafi well and is not having any side effects as a result of the treatment.  She denies any bleeding, bruising, or dark stools.  She has no other complaints.  A full 10 point ROS is listed below.  MEDICAL HISTORY:  Past Medical History:  Diagnosis Date   Allergic rhinitis    Anemia    Arthritis    BMI 35.0-35.9,adult    BMI 36.0-36.9,adult    Complication of anesthesia    " I don't tolerate the caine's (Novacaine, Lidocaine etc. well " also, no codiene.    GERD (gastroesophageal reflux disease)    Hammertoe of right foot    Headache    Hypertension    Plantar wart, right foot    Polycythemia vera (HCC)    Severe obesity (HCC)    Situational stress    Sleep apnea    PMH: " I don't have it now "   Thrombocytosis    Ventral hernia    Wears glasses     SURGICAL HISTORY: Past Surgical History:  Procedure Laterality Date   CESAREAN SECTION     CHOLECYSTECTOMY     COLONOSCOPY     HERNIA REPAIR     INSERTION OF MESH N/A 12/05/2017   Procedure: INSERTION OF MESH;  Surgeon: Manus Rudd, MD;  Location: [facility] OR;  Service: General;  Laterality: N/A;   VENTRAL HERNIA REPAIR  12/05/2017   OPEN VENTRAL HERNIA REPAIR ERAS PATHWAY w/mesh   VENTRAL HERNIA REPAIR N/A 12/05/2017   Procedure:  OPEN VENTRAL HERNIA REPAIR ERAS PATHWAY;  Surgeon: Manus Rudd, MD;  Location: [facility] OR;  Service: General;  Laterality: N/A;   VENTRAL HERNIA REPAIR  01/01/2019   VENTRAL HERNIA REPAIR N/A 01/01/2019   Procedure: LAPAROSCOPIC VENTRAL HERNIA REPAIR WITH MESH;  Surgeon: Manus Rudd, MD;  Location: [facility] OR;  Service: General;  Laterality: N/A;   WISDOM TOOTH EXTRACTION     WOUND EXPLORATION N/A 12/29/2017   Procedure: ABDOMINAL WOUND EXPLORATION;  Surgeon: Manus Rudd, MD;  Location: [facility] OR;  Service: General;  Laterality: N/A;  LMA    SOCIAL HISTORY: Social History   Socioeconomic History   Marital status: Married    Spouse name: Not on file   Number of  children: Not on file   Years of education: Not on file   Highest education level: Not on file  Occupational History   Not on file  Tobacco Use   Smoking status: Never   Smokeless tobacco: Never  Vaping Use   Vaping status: Never Used  Substance and Sexual Activity   Alcohol use: Yes    Comment: occasionally   Drug use: Not Currently   Sexual activity: Not on file  Other Topics Concern   Not on file  Social History Narrative   Not on file   Social Drivers of Health   Financial Resource Strain: Not on file  Food Insecurity: Not on file  Transportation Needs: Not on file  Physical Activity: Not on file  Stress: Not on file  Social Connections: Unknown (11/02/2021)   Received from [facility]   Social Network    Social Network: Not on file  Intimate Partner Violence: Unknown (09/23/2021)   Received from [facility], [facility]   HITS    Physically Hurt: Not on file    Insult or Talk Down To: Not on file    Threaten Physical Harm: Not on file    Scream or Curse: Not on file    FAMILY HISTORY: Family History  Problem Relation Age of Onset   Heart attack Mother    Breast cancer Mother    Rashes / Skin problems Father    Lupus Sister     ALLERGIES:  is allergic to codeine and local [lidocaine].  MEDICATIONS:  Current Outpatient Medications  Medication Sig Dispense Refill   azithromycin (ZITHROMAX) 250 MG tablet Take 2 pills on Day 1 with 1 pill every day thereafter 6 each 0   aspirin EC 81 MG tablet Take 81 mg by mouth daily.     chlorthalidone (HYGROTON) 25 MG tablet Take 25 mg by mouth every morning.     ibuprofen (ADVIL) 200 MG tablet Take 600 mg by mouth every 6 (six) hours as needed.     irbesartan (AVAPRO) 300 MG tablet Take 300 mg by mouth daily.     JAKAFI 10 MG tablet TAKE 1 TABLET BY MOUTH TWICE  DAILY 60 tablet 1   No current facility-administered medications for this visit.    REVIEW OF SYSTEMS:   Constitutional: ( - ) fevers, (  - )  chills , ( - ) night sweats Eyes: ( - ) blurriness of vision, ( - ) double vision, ( - ) watery eyes Ears, nose, mouth, throat, and face: ( - ) mucositis, ( - ) sore throat Respiratory: ( - ) cough, ( - ) dyspnea, ( - ) wheezes Cardiovascular: ( - ) palpitation, ( - ) chest discomfort, ( - ) lower extremity swelling Gastrointestinal:  ( - )  nausea, ( - ) heartburn, ( - ) change in bowel habits Skin: ( - ) abnormal skin rashes Lymphatics: ( - ) new lymphadenopathy, ( - ) easy bruising Neurological: ( - ) numbness, ( - ) tingling, ( - ) new weaknesses Behavioral/Psych: ( - ) mood change, ( - ) new changes  All other systems were reviewed with the patient and are negative.  PHYSICAL EXAMINATION: ECOG PERFORMANCE STATUS: 0 - Asymptomatic  Vitals:   07/12/23 1147  BP: (!) 176/99  Pulse: 65  Resp: 15  Temp: 98 F (36.7 C)  SpO2: 100%        Filed Weights   07/12/23 1147  Weight: 233 lb 11.2 oz (106 kg)        10/25/22  Temp 98 F (36.7 C)  Temp src Oral  Pulse 78  Resp 17  BP 146/76 !    GENERAL: well appearing middle aged Caucasian female. alert, no distress and comfortable SKIN: skin color, texture, turgor are normal, no rashes or significant lesions EYES: conjunctiva are pink and non-injected, sclera clear LUNGS: clear to auscultation and percussion with normal breathing effort HEART: regular rate & rhythm and no murmurs and no lower extremity edema Musculoskeletal: no cyanosis of digits and no clubbing  PSYCH: alert & oriented x 3, fluent speech NEURO: no focal motor/sensory deficits  LABORATORY DATA:  I have reviewed the data as listed    Latest Ref Rng & Units 07/12/2023   10:43 AM 04/12/2023    9:32 AM 03/01/2023    9:50 AM  CBC  WBC 4.0 - 10.5 K/uL 16.4  10.0  8.4   Hemoglobin 12.0 - 15.0 g/dL 09.8  11.9  14.7   Hematocrit 36.0 - 46.0 % 41.9  40.3  38.4   Platelets 150 - 400 K/uL 1,403  1,076  798        Latest Ref Rng & Units 07/12/2023    10:43 AM 04/12/2023    9:32 AM 03/01/2023    9:50 AM  CMP  Glucose 70 - 99 mg/dL 829  562  130   BUN 8 - 23 mg/dL 14  26  18    Creatinine 0.44 - 1.00 mg/dL 8.65  7.84  6.96   Sodium 135 - 145 mmol/L 136  134  139   Potassium 3.5 - 5.1 mmol/L 4.3  4.0  4.1   Chloride 98 - 111 mmol/L 104  100  106   CO2 22 - 32 mmol/L 29  29  27    Calcium 8.9 - 10.3 mg/dL 9.3  9.5  9.0   Total Protein 6.5 - 8.1 g/dL 7.5  8.1  7.4   Total Bilirubin 0.0 - 1.2 mg/dL 0.5  0.6  0.5   Alkaline Phos 38 - 126 U/L 74  91  81   AST 15 - 41 U/L 14  13  12    ALT 0 - 44 U/L 8  12  10      RADIOGRAPHIC STUDIES:  No results found.   ASSESSMENT & PLAN Connie Myers 63 y.o. female with medical history significant for JAK2 positive PV who presents for a follow up visit. After review of the labs, discussion with the patient and review of the prior records her findings are most consistent with polycythemia vera.  The findings show that the patient has iron deficiency, low EPO levels, and marked erythrocytosis and thrombocytosis.  It is most likely that the patient has polycythemia vera which is caused an overproduction of red  blood cells resulting in decreased iron stores and subsequent development of thrombocytosis.  The treatments at this time are designed to decrease her risk of thrombosis.  As such we will continue her on 81 mg ASA daily and will add hydroxyurea 500 mg p.o. daily as cytoreductive therapy.  Additionally in order to drop her levels further we will have a target hematocrit of 42%.  For this we will be performing phlebotomy to drop her levels to target.  Previously we discussed the risk of developing acute leukemia as well as myelofibrosis.  We will start phlebotomies as soon as we can and the hydroxyurea prescription has already been prescribed.  We will plan to see her back in approximately 1 month's time in order to evaluate how she is tolerating therapy.  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 --continue Jakafi 10 mg BID --target HCT 42% (today 41.9%). No need for phlebotomy today.  --labs today show WBC 16.4, Hgb 13.4, MCV 70.3, Plt 1403 --avoid iron supplementation/iron rich foods as our goal is to decrease the bodies iron supply. --previously discussed risk for development of acute leukemia/myelofibrosis --RTC in 3 months with interval 4 week labs (due to increasing thrombocytosis/leukocytosis)  # Viral Illness # Cough/Congestion  -- Patient is having cough and congestion from viral illness in November 2024.  She has residual symptoms. -- Concern for possible secondary bacterial infection.  Recommend Z-Pak -- This is the likely reason why her white blood cell count and platelets are so elevated.  Will have her return for labs in 4 weeks time in order to assure these are resolving after she is complete antibiotic therapy.  No orders of the defined types were placed in this encounter.  All questions were answered. The patient knows to call the clinic with any problems, questions or concerns.  I have spent a total of 25 minutes minutes of face-to-face and non-face-to-face time, preparing to see the patient,performing a medically appropriate examination, counseling and educating the patient, documenting clinical information in the electronic health record,  and care coordination.   Ulysees Barns, MD Department of Hematology/Oncology [facility] at [facility] Phone: [phone] Pager: [phone] Email: [email or web address]  07/12/2023 1:53 PM

## 2023-07-12 NOTE — Progress Notes (Signed)
CRITICAL VALUE STICKER  CRITICAL VALUE: plt 1403  RECEIVER (on-site recipient of call): Morrie Sheldon   DATE & TIME NOTIFIED: 1/22 @ 11:15  MESSENGER (representative from lab): Melissa   MD NOTIFIED: Dr. Beatris Ship   TIME OF NOTIFICATION: 11:16  RESPONSE: patient has appt with Dr. Leonides Schanz today

## 2023-07-14 ENCOUNTER — Encounter: Payer: Self-pay | Admitting: Hematology and Oncology

## 2023-07-23 ENCOUNTER — Other Ambulatory Visit: Payer: Self-pay | Admitting: Hematology and Oncology

## 2023-07-23 DIAGNOSIS — Z1589 Genetic susceptibility to other disease: Secondary | ICD-10-CM

## 2023-07-23 DIAGNOSIS — D45 Polycythemia vera: Secondary | ICD-10-CM

## 2023-07-24 ENCOUNTER — Encounter: Payer: Self-pay | Admitting: Hematology and Oncology

## 2023-08-10 ENCOUNTER — Inpatient Hospital Stay: Payer: No Typology Code available for payment source | Attending: Hematology and Oncology

## 2023-08-10 DIAGNOSIS — D45 Polycythemia vera: Secondary | ICD-10-CM | POA: Diagnosis present

## 2023-08-10 LAB — CBC WITH DIFFERENTIAL (CANCER CENTER ONLY)
Abs Immature Granulocytes: 0.16 10*3/uL — ABNORMAL HIGH (ref 0.00–0.07)
Basophils Absolute: 0.2 10*3/uL — ABNORMAL HIGH (ref 0.0–0.1)
Basophils Relative: 2 %
Eosinophils Absolute: 0.5 10*3/uL (ref 0.0–0.5)
Eosinophils Relative: 5 %
HCT: 40.5 % (ref 36.0–46.0)
Hemoglobin: 12.9 g/dL (ref 12.0–15.0)
Immature Granulocytes: 1 %
Lymphocytes Relative: 14 %
Lymphs Abs: 1.6 10*3/uL (ref 0.7–4.0)
MCH: 22.7 pg — ABNORMAL LOW (ref 26.0–34.0)
MCHC: 31.9 g/dL (ref 30.0–36.0)
MCV: 71.3 fL — ABNORMAL LOW (ref 80.0–100.0)
Monocytes Absolute: 1.3 10*3/uL — ABNORMAL HIGH (ref 0.1–1.0)
Monocytes Relative: 12 %
Neutro Abs: 7.7 10*3/uL (ref 1.7–7.7)
Neutrophils Relative %: 66 %
Platelet Count: 832 10*3/uL — ABNORMAL HIGH (ref 150–400)
RBC: 5.68 MIL/uL — ABNORMAL HIGH (ref 3.87–5.11)
RDW: 16.9 % — ABNORMAL HIGH (ref 11.5–15.5)
WBC Count: 11.5 10*3/uL — ABNORMAL HIGH (ref 4.0–10.5)
nRBC: 0 % (ref 0.0–0.2)

## 2023-08-10 LAB — CMP (CANCER CENTER ONLY)
ALT: 11 U/L (ref 0–44)
AST: 13 U/L — ABNORMAL LOW (ref 15–41)
Albumin: 4.2 g/dL (ref 3.5–5.0)
Alkaline Phosphatase: 72 U/L (ref 38–126)
Anion gap: 5 (ref 5–15)
BUN: 15 mg/dL (ref 8–23)
CO2: 26 mmol/L (ref 22–32)
Calcium: 8.9 mg/dL (ref 8.9–10.3)
Chloride: 107 mmol/L (ref 98–111)
Creatinine: 0.76 mg/dL (ref 0.44–1.00)
GFR, Estimated: >60 mL/min (ref >60)
Glucose, Bld: 93 mg/dL (ref 70–99)
Potassium: 4 mmol/L (ref 3.5–5.1)
Sodium: 138 mmol/L (ref 135–145)
Total Bilirubin: 0.5 mg/dL (ref 0.0–1.2)
Total Protein: 7.1 g/dL (ref 6.5–8.1)

## 2023-08-11 LAB — FERRITIN: Ferritin: 8 ng/mL — ABNORMAL LOW (ref 11–307)

## 2023-09-09 ENCOUNTER — Encounter: Payer: Self-pay | Admitting: Hematology and Oncology

## 2023-09-20 ENCOUNTER — Other Ambulatory Visit: Payer: Self-pay | Admitting: Hematology and Oncology

## 2023-09-20 DIAGNOSIS — Z1589 Genetic susceptibility to other disease: Secondary | ICD-10-CM

## 2023-09-20 DIAGNOSIS — D45 Polycythemia vera: Secondary | ICD-10-CM

## 2023-09-21 ENCOUNTER — Telehealth: Payer: Self-pay | Admitting: Hematology and Oncology

## 2023-09-21 NOTE — Telephone Encounter (Signed)
 Connie Myers is acceptable to all appointments scheduled as is.

## 2023-10-05 ENCOUNTER — Inpatient Hospital Stay (HOSPITAL_BASED_OUTPATIENT_CLINIC_OR_DEPARTMENT_OTHER): Admitting: Hematology and Oncology

## 2023-10-05 ENCOUNTER — Ambulatory Visit: Payer: No Typology Code available for payment source | Admitting: Hematology and Oncology

## 2023-10-05 ENCOUNTER — Other Ambulatory Visit: Payer: No Typology Code available for payment source

## 2023-10-05 ENCOUNTER — Inpatient Hospital Stay: Attending: Hematology and Oncology

## 2023-10-05 VITALS — BP 173/88 | HR 77 | Temp 97.9°F | Resp 15 | Wt 233.6 lb

## 2023-10-05 DIAGNOSIS — Z1589 Genetic susceptibility to other disease: Secondary | ICD-10-CM | POA: Diagnosis not present

## 2023-10-05 DIAGNOSIS — Z7982 Long term (current) use of aspirin: Secondary | ICD-10-CM | POA: Diagnosis not present

## 2023-10-05 DIAGNOSIS — Z7964 Long term (current) use of myelosuppressive agent: Secondary | ICD-10-CM | POA: Insufficient documentation

## 2023-10-05 DIAGNOSIS — D45 Polycythemia vera: Secondary | ICD-10-CM | POA: Insufficient documentation

## 2023-10-05 DIAGNOSIS — D75838 Other thrombocytosis: Secondary | ICD-10-CM | POA: Insufficient documentation

## 2023-10-05 DIAGNOSIS — E611 Iron deficiency: Secondary | ICD-10-CM | POA: Insufficient documentation

## 2023-10-05 LAB — CBC WITH DIFFERENTIAL (CANCER CENTER ONLY)
Abs Immature Granulocytes: 0.21 10*3/uL — ABNORMAL HIGH (ref 0.00–0.07)
Basophils Absolute: 0.3 10*3/uL — ABNORMAL HIGH (ref 0.0–0.1)
Basophils Relative: 2 %
Eosinophils Absolute: 0.6 10*3/uL — ABNORMAL HIGH (ref 0.0–0.5)
Eosinophils Relative: 5 %
HCT: 41.6 % (ref 36.0–46.0)
Hemoglobin: 13.1 g/dL (ref 12.0–15.0)
Immature Granulocytes: 2 %
Lymphocytes Relative: 17 %
Lymphs Abs: 2.3 10*3/uL (ref 0.7–4.0)
MCH: 20.5 pg — ABNORMAL LOW (ref 26.0–34.0)
MCHC: 31.5 g/dL (ref 30.0–36.0)
MCV: 65 fL — ABNORMAL LOW (ref 80.0–100.0)
Monocytes Absolute: 1.3 10*3/uL — ABNORMAL HIGH (ref 0.1–1.0)
Monocytes Relative: 10 %
Neutro Abs: 9 10*3/uL — ABNORMAL HIGH (ref 1.7–7.7)
Neutrophils Relative %: 64 %
Platelet Count: 858 10*3/uL — ABNORMAL HIGH (ref 150–400)
RBC: 6.4 MIL/uL — ABNORMAL HIGH (ref 3.87–5.11)
RDW: 18.3 % — ABNORMAL HIGH (ref 11.5–15.5)
WBC Count: 13.7 10*3/uL — ABNORMAL HIGH (ref 4.0–10.5)
nRBC: 0 % (ref 0.0–0.2)

## 2023-10-05 LAB — CMP (CANCER CENTER ONLY)
ALT: 11 U/L (ref 0–44)
AST: 14 U/L — ABNORMAL LOW (ref 15–41)
Albumin: 4.5 g/dL (ref 3.5–5.0)
Alkaline Phosphatase: 78 U/L (ref 38–126)
Anion gap: 6 (ref 5–15)
BUN: 19 mg/dL (ref 8–23)
CO2: 27 mmol/L (ref 22–32)
Calcium: 9.3 mg/dL (ref 8.9–10.3)
Chloride: 105 mmol/L (ref 98–111)
Creatinine: 0.62 mg/dL (ref 0.44–1.00)
GFR, Estimated: >60 mL/min (ref >60)
Glucose, Bld: 92 mg/dL (ref 70–99)
Potassium: 4 mmol/L (ref 3.5–5.1)
Sodium: 138 mmol/L (ref 135–145)
Total Bilirubin: 0.6 mg/dL (ref 0.0–1.2)
Total Protein: 7.5 g/dL (ref 6.5–8.1)

## 2023-10-05 NOTE — Progress Notes (Signed)
 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 First CBC on record 2) 12/29/2017: WBC 14.1, Hgb 15.0, MCV 72.1, Plt 1074 3) 12/28/2018: WBC 10.9, Hgb 16.3, MCV 67.8, Hgb 982 4) 10/25/2019: WBC 9.5, Hgb 15.8, MCV 67.9, Plt 976 5) 12/19/2019: establish care with Dr. Rosaline Coma. JAK2 V617F mutation returned as positive. Patient already on ASA 81mg  PO daily.  6) 01/16/2020: bone marrow biopsy confirms hypercellular marrow consistent with polycythemia vera. Started hydroxyurea  500 mg PO daily.  7) Transferred care to Dr. Maria Shiner at [facility]. 8) 06/28/2022: transition care back to Dr. Rosaline Coma at [facility]  9) 12/19/2022: started treatment with Jakafi  10 mg PO BID   Interval History:  Connie Myers 63 y.o. female with medical history significant for JAK2 positive PV who presents for a follow up visit. The patient's last visit was on 07/12/2023. In the interim since the last visit she has continued treatment with Jakafi .    On exam today Mrs. Garmany notes she is quite nervous because her husband was laid off through [place] [employer].  She reports that she will be losing her insurance soon.  She notes that she has been taking folate but trying to avoid iron therapy.  She notes that she has been rationing her directly, only taking 1 pill every 2 days.  She reports that she cut this back a few months ago without discussing with us .  Overall she is tolerating the medication well, but is nervous about running out altogether due to insurance issues. She is not having any side effects as a result of the treatment.  She denies any bleeding, bruising, or dark stools.  She has no other  complaints.  A full 10 point ROS is listed below.  MEDICAL HISTORY:  Past Medical History:  Diagnosis Date   Allergic rhinitis    Anemia    Arthritis    BMI 35.0-35.9,adult    BMI 36.0-36.9,adult    Complication of anesthesia    " I don't tolerate the caine's (Novacaine, Lidocaine  etc. well " also, no codiene.    GERD (gastroesophageal reflux disease)    Hammertoe of right foot    Headache    Hypertension    Plantar wart, right foot    Polycythemia vera (HCC)    Severe obesity (HCC)    Situational stress    Sleep apnea    PMH: " I don't have it now "   Thrombocytosis    Ventral hernia    Wears glasses     SURGICAL HISTORY: Past Surgical History:  Procedure Laterality Date   CESAREAN SECTION     CHOLECYSTECTOMY     COLONOSCOPY     HERNIA REPAIR     INSERTION OF MESH N/A 12/05/2017   Procedure: INSERTION OF MESH;  Surgeon: Dareen Ebbing, MD;  Location: [facility] OR;  Service: General;  Laterality: N/A;   VENTRAL HERNIA REPAIR  12/05/2017   OPEN VENTRAL HERNIA REPAIR ERAS PATHWAY w/mesh   VENTRAL HERNIA REPAIR N/A 12/05/2017   Procedure: OPEN VENTRAL HERNIA REPAIR ERAS PATHWAY;  Surgeon: Dareen Ebbing, MD;  Location: [facility] OR;  Service: General;  Laterality: N/A;   VENTRAL HERNIA REPAIR  01/01/2019   VENTRAL HERNIA REPAIR  N/A 01/01/2019   Procedure: LAPAROSCOPIC VENTRAL HERNIA REPAIR WITH MESH;  Surgeon: Dareen Ebbing, MD;  Location: [facility] OR;  Service: General;  Laterality: N/A;   WISDOM TOOTH EXTRACTION     WOUND EXPLORATION N/A 12/29/2017   Procedure: ABDOMINAL WOUND EXPLORATION;  Surgeon: Dareen Ebbing, MD;  Location: [facility] OR;  Service: General;  Laterality: N/A;  LMA    SOCIAL HISTORY: Social History   Socioeconomic History   Marital status: Married    Spouse name: Not on file   Number of children: Not on file   Years of education: Not on file   Highest education level: Not on file  Occupational History   Not on file  Tobacco Use   Smoking status: Never   Smokeless  tobacco: Never  Vaping Use   Vaping status: Never Used  Substance and Sexual Activity   Alcohol use: Yes    Comment: occasionally   Drug use: Not Currently   Sexual activity: Not on file  Other Topics Concern   Not on file  Social History Narrative   Not on file   Social Drivers of Health   Financial Resource Strain: Not on file  Food Insecurity: Not on file  Transportation Needs: Not on file  Physical Activity: Not on file  Stress: Not on file  Social Connections: Unknown (11/02/2021)   [facility] from [facility]   Social Network    Social Network: Not on file  Intimate Partner Violence: Unknown (09/23/2021)   Received from [facility], [facility]   HITS    Physically Hurt: Not on file    Insult or Talk Down To: Not on file    Threaten Physical Harm: Not on file    Scream or Curse: Not on file    FAMILY HISTORY: Family History  Problem Relation Age of Onset   Heart attack Mother    Breast cancer Mother    Rashes / Skin problems Father    Lupus Sister     ALLERGIES:  is allergic to codeine and local [lidocaine ].  MEDICATIONS:  Current Outpatient Medications  Medication Sig Dispense Refill   aspirin EC 81 MG tablet Take 81 mg by mouth daily.     azithromycin  (ZITHROMAX ) 250 MG tablet Take 2 pills on Day 1 with 1 pill every day thereafter 6 each 0   chlorthalidone (HYGROTON) 25 MG tablet Take 25 mg by mouth every morning.     ibuprofen  (ADVIL ) 200 MG tablet Take 600 mg by mouth every 6 (six) hours as needed.     irbesartan  (AVAPRO ) 300 MG tablet Take 300 mg by mouth daily.     JAKAFI  10 MG tablet TAKE 1 TABLET BY MOUTH TWICE  DAILY 60 tablet 1   No current facility-administered medications for this visit.    REVIEW OF SYSTEMS:   Constitutional: ( - ) fevers, ( - )  chills , ( - ) night sweats Eyes: ( - ) blurriness of vision, ( - ) double vision, ( - ) watery eyes Ears, nose, mouth, throat, and face: ( - ) mucositis, ( - ) sore  throat Respiratory: ( - ) cough, ( - ) dyspnea, ( - ) wheezes Cardiovascular: ( - ) palpitation, ( - ) chest discomfort, ( - ) lower extremity swelling Gastrointestinal:  ( - ) nausea, ( - ) heartburn, ( - ) change in bowel habits Skin: ( - ) abnormal skin rashes Lymphatics: ( - ) new lymphadenopathy, ( - ) easy bruising Neurological: ( - )  numbness, ( - ) tingling, ( - ) new weaknesses Behavioral/Psych: ( - ) mood change, ( - ) new changes  All other systems were reviewed with the patient and are negative.  PHYSICAL EXAMINATION: ECOG PERFORMANCE STATUS: 0 - Asymptomatic  Vitals:   10/05/23 1529  BP: (!) 173/88  Pulse: 77  Resp: 15  Temp: 97.9 F (36.6 C)  SpO2: 93%         Filed Weights   10/05/23 1529  Weight: 233 lb 9.6 oz (106 kg)         10/25/22  Temp 98 F (36.7 C)  Temp src Oral  Pulse 78  Resp 17  BP 146/76 !    GENERAL: well appearing middle aged Caucasian female. alert, no distress and comfortable SKIN: skin color, texture, turgor are normal, no rashes or significant lesions EYES: conjunctiva are pink and non-injected, sclera clear LUNGS: clear to auscultation and percussion with normal breathing effort HEART: regular rate & rhythm and no murmurs and no lower extremity edema Musculoskeletal: no cyanosis of digits and no clubbing  PSYCH: alert & oriented x 3, fluent speech NEURO: no focal motor/sensory deficits  LABORATORY DATA:  I have reviewed the data as listed    Latest Ref Rng & Units 10/05/2023    3:09 PM 08/10/2023   11:11 AM 07/12/2023   10:43 AM  CBC  WBC 4.0 - 10.5 K/uL 13.7  11.5  16.4   Hemoglobin 12.0 - 15.0 g/dL 87.5  64.3  32.9   Hematocrit 36.0 - 46.0 % 41.6  40.5  41.9   Platelets 150 - 400 K/uL 858  832  1,403        Latest Ref Rng & Units 10/05/2023    3:09 PM 08/10/2023   11:11 AM 07/12/2023   10:43 AM  CMP  Glucose 70 - 99 mg/dL 92  93  518   BUN 8 - 23 mg/dL 19  15  14    Creatinine 0.44 - 1.00 mg/dL 8.41  6.60   6.30   Sodium 135 - 145 mmol/L 138  138  136   Potassium 3.5 - 5.1 mmol/L 4.0  4.0  4.3   Chloride 98 - 111 mmol/L 105  107  104   CO2 22 - 32 mmol/L 27  26  29    Calcium 8.9 - 10.3 mg/dL 9.3  8.9  9.3   Total Protein 6.5 - 8.1 g/dL 7.5  7.1  7.5   Total Bilirubin 0.0 - 1.2 mg/dL 0.6  0.5  0.5   Alkaline Phos 38 - 126 U/L 78  72  74   AST 15 - 41 U/L 14  13  14    ALT 0 - 44 U/L 11  11  8      RADIOGRAPHIC STUDIES:  No results found.   ASSESSMENT & PLAN ASHYA NICOLAISEN 63 y.o. female with medical history significant for JAK2 positive PV who presents for a follow up visit. After review of the labs, discussion with the patient and review of the prior records her findings are most consistent with polycythemia vera.  The findings show that the patient has iron deficiency, low EPO levels, and marked erythrocytosis and thrombocytosis.  It is most likely that the patient has polycythemia vera which is caused an overproduction of red blood cells resulting in decreased iron stores and subsequent development of thrombocytosis.  The treatments at this time are designed to decrease her risk of thrombosis.  As such we will continue her on  81 mg ASA daily and will add hydroxyurea  500 mg p.o. daily as cytoreductive therapy.  Additionally in order to drop her levels further we will have a target hematocrit of 42%.  For this we will be performing phlebotomy to drop her levels to target.  Previously we discussed the risk of developing acute leukemia as well as myelofibrosis.  We will start phlebotomies as soon as we can and the hydroxyurea  prescription has already been prescribed.  We will plan to see her back in approximately 1 month's time in order to evaluate how she is tolerating therapy.  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 --continue Jakafi  10 mg BID.  The patient is unable to obtain the medication due to insurance issues we can go back to hydroxyurea . --target HCT 42% (today 41.6%). No need for phlebotomy today.  --labs today show WBC 13.7, hemoglobin 13.1, MCV 65, platelets 858 --avoid iron supplementation/iron rich foods as our goal is to decrease the bodies iron supply. --previously discussed risk for development of acute leukemia/myelofibrosis --RTC in 3 months with labs.   No orders of the defined types were placed in this encounter.  All questions were answered. The patient knows to call the clinic with any problems, questions or concerns.  I have spent a total of 30 minutes minutes of face-to-face and non-face-to-face time, preparing to see the patient,performing a medically appropriate examination, counseling and educating the patient, documenting clinical information in the electronic health record,  and care coordination.   Rogerio Clay, MD Department of Hematology/Oncology [facility] Phone: [phone] Pager: [phone] Email: [email or web address]  10/11/2023 1:27 PM

## 2023-10-06 ENCOUNTER — Telehealth: Payer: Self-pay | Admitting: Hematology and Oncology

## 2023-10-06 LAB — FERRITIN: Ferritin: 8 ng/mL — ABNORMAL LOW (ref 11–307)

## 2023-10-11 ENCOUNTER — Encounter: Payer: Self-pay | Admitting: Hematology and Oncology

## 2023-11-19 ENCOUNTER — Other Ambulatory Visit: Payer: Self-pay | Admitting: Hematology and Oncology

## 2023-11-19 DIAGNOSIS — D45 Polycythemia vera: Secondary | ICD-10-CM

## 2023-11-19 DIAGNOSIS — Z1589 Genetic susceptibility to other disease: Secondary | ICD-10-CM

## 2023-11-28 ENCOUNTER — Encounter: Payer: Self-pay | Admitting: Hematology and Oncology

## 2023-11-28 ENCOUNTER — Other Ambulatory Visit (HOSPITAL_COMMUNITY): Payer: Self-pay

## 2023-12-28 ENCOUNTER — Encounter: Payer: Self-pay | Admitting: Hematology and Oncology

## 2024-01-04 ENCOUNTER — Inpatient Hospital Stay: Payer: Self-pay | Admitting: Hematology and Oncology

## 2024-01-04 ENCOUNTER — Encounter: Payer: Self-pay | Admitting: Hematology and Oncology

## 2024-01-04 ENCOUNTER — Other Ambulatory Visit: Payer: Self-pay | Admitting: Hematology and Oncology

## 2024-01-04 ENCOUNTER — Inpatient Hospital Stay: Payer: Self-pay

## 2024-01-04 DIAGNOSIS — D45 Polycythemia vera: Secondary | ICD-10-CM

## 2024-01-04 NOTE — Progress Notes (Signed)
 No show--insurance issues.

## 2024-01-05 ENCOUNTER — Telehealth: Payer: Self-pay | Admitting: Hematology and Oncology

## 2024-01-19 ENCOUNTER — Telehealth: Payer: Self-pay | Admitting: Hematology and Oncology

## 2024-01-23 ENCOUNTER — Encounter: Payer: Self-pay | Admitting: Hematology and Oncology

## 2024-01-24 ENCOUNTER — Telehealth: Payer: Self-pay | Admitting: *Deleted

## 2024-01-24 ENCOUNTER — Encounter: Payer: Self-pay | Admitting: Hematology and Oncology

## 2024-01-24 ENCOUNTER — Encounter: Payer: Self-pay | Admitting: *Deleted

## 2024-01-24 NOTE — Telephone Encounter (Signed)
 TCT patient to follow up with her regarding appts here at the Cancer center for her polcythemia. No answer but was able to leave detailed message on her identified phone vm. Advised to call back at her convenience regarding setting up appts here.

## 2024-07-16 ENCOUNTER — Emergency Department (HOSPITAL_COMMUNITY): Admission: EM | Admit: 2024-07-16 | Discharge: 2024-07-16 | Disposition: A | Discharge status: Home / Self Care

## 2024-07-16 ENCOUNTER — Other Ambulatory Visit: Payer: Self-pay

## 2024-07-16 ENCOUNTER — Encounter (HOSPITAL_COMMUNITY): Payer: Self-pay

## 2024-07-16 ENCOUNTER — Encounter: Payer: Self-pay | Admitting: Hematology and Oncology

## 2024-07-16 ENCOUNTER — Emergency Department (HOSPITAL_COMMUNITY)

## 2024-07-16 DIAGNOSIS — S0990XA Unspecified injury of head, initial encounter: Secondary | ICD-10-CM | POA: Diagnosis not present

## 2024-07-16 DIAGNOSIS — W19XXXA Unspecified fall, initial encounter: Secondary | ICD-10-CM

## 2024-07-16 DIAGNOSIS — Z7982 Long term (current) use of aspirin: Secondary | ICD-10-CM | POA: Insufficient documentation

## 2024-07-16 DIAGNOSIS — Y990 Civilian activity done for income or pay: Secondary | ICD-10-CM | POA: Insufficient documentation

## 2024-07-16 DIAGNOSIS — R519 Headache, unspecified: Secondary | ICD-10-CM | POA: Diagnosis present

## 2024-07-16 DIAGNOSIS — W000XXA Fall on same level due to ice and snow, initial encounter: Secondary | ICD-10-CM | POA: Insufficient documentation

## 2024-07-16 NOTE — Discharge Instructions (Addendum)
 Please return to work only if your headache and nausea has completely resolved.   You likely do have a mild concussion.  Resting for this is rest, Tylenol .  If you do anything active either physically or mentally any start developing a headache or nausea then please stop doing this.  Take Tylenol  for pain.  If anything changes come back to ED for further care.

## 2024-07-16 NOTE — ED Triage Notes (Signed)
 Patient slipped on ice at work and fell backwards hittng hit and sacrum.  Complains of head pain, jaw lower back sacral and left hand/wrist.  Patient ambulatory in triage.  Patient on medication that can cause low plts and anemia NOT thinner.  NO LOC

## 2024-07-16 NOTE — ED Provider Notes (Signed)
 " [place] EMERGENCY DEPARTMENT AT [facility] Provider Note   CSN: [id number] Arrival date & time: 07/16/24  1317     Patient presents with: Connie Myers is a 64 y.o. female.    Fall Pertinent negatives include no chest pain, no abdominal pain and no shortness of breath.   Patient mechanical fall.  Struck the back of her head.  No loss conscious.  Mild headache.  No neck pain.  No other injury.  No anticoagulation use.  Denies all other complaints.  No chest pain or shortness of breath before or after the fall.    Prior to Admission medications  Medication Sig Start Date End Date Taking? Authorizing Provider  aspirin EC 81 MG tablet Take 81 mg by mouth daily.    [provider]  azithromycin  (ZITHROMAX ) 250 MG tablet Take 2 pills on Day 1 with 1 pill every day thereafter 07/12/23   Dorsey, John T IV, MD  chlorthalidone (HYGROTON) 25 MG tablet Take 25 mg by mouth every morning. 04/06/23   [provider]  ibuprofen  (ADVIL ) 200 MG tablet Take 600 mg by mouth every 6 (six) hours as needed.    [provider]  irbesartan (AVAPRO) 300 MG tablet Take 300 mg by mouth daily. 03/28/23   [provider]  JAKAFI  10 MG tablet TAKE 1 TABLET BY MOUTH TWICE  DAILY 11/19/23   Dorsey, John T IV, MD    Allergies: Codeine and Local [lidocaine ]    Review of Systems  Constitutional:  Negative for chills and fever.  HENT:  Negative for ear pain and sore throat.   Eyes:  Negative for pain and visual disturbance.  Respiratory:  Negative for cough and shortness of breath.   Cardiovascular:  Negative for chest pain and palpitations.  Gastrointestinal:  Negative for abdominal pain and vomiting.  Genitourinary:  Negative for dysuria and hematuria.  Musculoskeletal:  Negative for arthralgias and back pain.  Skin:  Negative for color change and rash.  Neurological:  Negative for seizures and syncope.  All other systems reviewed and are  negative.   Updated Vital Signs BP (!) 162/85 (BP Location: Right Arm)   Pulse 62   Temp 97.9 F (36.6 C) (Oral)   Resp 18   Ht 5' 9 (1.753 m)   Wt 106 kg   SpO2 98%   BMI 34.51 kg/m   Physical Exam Vitals and nursing note reviewed.  Constitutional:      General: She is not in acute distress.    Appearance: She is well-developed.  HENT:     Head: Normocephalic and atraumatic.  Eyes:     Conjunctiva/sclera: Conjunctivae normal.  Cardiovascular:     Rate and Rhythm: Normal rate and regular rhythm.     Heart sounds: No murmur heard. Pulmonary:     Effort: Pulmonary effort is normal. No respiratory distress.     Breath sounds: Normal breath sounds.  Abdominal:     Palpations: Abdomen is soft.     Tenderness: There is no abdominal tenderness.  Musculoskeletal:        General: No swelling.     Cervical back: Neck supple.  Skin:    General: Skin is warm and dry.     Capillary Refill: Capillary refill takes less than 2 seconds.  Neurological:     Mental Status: She is alert.  Psychiatric:        Mood and Affect: Mood normal.     (all  labs ordered are listed, but only abnormal results are displayed) Labs Reviewed - No data to display  EKG: None  Radiology: CT Head Wo Contrast Result Date: 07/16/2024 EXAM: CT HEAD WITHOUT CONTRAST 07/16/2024 02:01:09 PM TECHNIQUE: CT of the head was performed without the administration of intravenous contrast. Automated exposure control, iterative reconstruction, and/or weight based adjustment of the mA/kV was utilized to reduce the radiation dose to as low as reasonably achievable. COMPARISON: None available. CLINICAL HISTORY: Head trauma, moderate to severe. FINDINGS: BRAIN AND VENTRICLES: No acute hemorrhage. 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 No evidence of acute infarct. No hydrocephalus. No extra-axial collection. No mass effect or midline shift. ORBITS: No acute abnormality. SINUSES: No acute  abnormality. SOFT TISSUES AND SKULL: Soft tissue swelling in left posterior parietal scalp. No skull fracture. IMPRESSION: 1. No acute intracranial abnormality. 2. Soft tissue swelling in the left posterior parietal scalp, compatible with trauma. Electronically signed by: Donnice Mania MD 07/16/2024 02:46 PM EST RP Workstation: [id number]     Procedures   Medications Ordered in the ED - No data to display                                  Medical Decision Making    HPI:  Patient mechanical fall.  Struck the back of her head.  No loss conscious.  Mild headache.  No neck pain.  No other injury.  No anticoagulation use.  Denies all other complaints.  No chest pain or shortness of breath before or after the fall.  MDM:   Upon examination, patient hemodynamically stable. A&O x 3 with GCS 15.  Neurologically intact.  No cervical thoracic or lumbar pain to palpation.  No concern for acute cervical thoracic or lumbar injury.  No indication for imaging.  Did obtain CT head to rule out subdural epidural.  This is unremarkable.  No obvious skin laceration that needs suturing.  Could be a concussion given some nausea after the fall and head strike.  Recommend rest.  Tylenol .  Fall was mechanical in nature.  No concerns for other forms of syncope.   I have independently interpreted the CT  images and agree with the radiologist finding    Disposition and Follow Up: pcp      Final diagnoses:  Fall, initial encounter  Injury of head, initial encounter    ED Discharge Orders     None          Simon Lavonia SAILOR, MD 07/16/24 1605  "

## 2024-07-16 NOTE — ED Provider Triage Note (Signed)
 Emergency Medicine Provider Triage Evaluation Note  Connie Myers , a 64 y.o. female  was evaluated in triage.  Pt complains of fall. Slipped on ice and fell PTA.  Struck the back of her head, no LOC. Mild headache, no neck pain, no other injury.  No precipitating sxs prior to the fall.  Is currently on Jakafi  due to hx of polycythemia vera  Review of Systems  Positive: As above Negative: As above  Physical Exam  BP (!) 185/92   Pulse 69   Temp 97.8 F (36.6 C)   Resp 17   Ht 5' 9 (1.753 m)   Wt 106 kg   SpO2 96%   BMI 34.51 kg/m  Gen:   Awake, no distress   Resp:  Normal effort  MSK:   Moves extremities without difficulty  Other:  Ttp vertex of scalp with small bruising  Medical Decision Making  Medically screening exam initiated at 1:35 PM.  Appropriate orders placed.  Connie Myers was informed that the remainder of the evaluation will be completed by another provider, this initial triage assessment does not replace that evaluation, and the importance of remaining in the ED until their evaluation is complete.     Connie Colon, PA-C 07/16/24 1336
